# Patient Record
Sex: Female | Born: 1990 | Hispanic: Yes | Marital: Single | Smoking: Never smoker
Health system: Southern US, Community
[De-identification: ages and names within clinical notes are randomized; demographics above are authoritative.]

## PROBLEM LIST (undated history)

## (undated) ENCOUNTER — Inpatient Hospital Stay (HOSPITAL_COMMUNITY): Payer: Self-pay

## (undated) DIAGNOSIS — Z8489 Family history of other specified conditions: Secondary | ICD-10-CM

## (undated) DIAGNOSIS — D649 Anemia, unspecified: Secondary | ICD-10-CM

## (undated) DIAGNOSIS — R112 Nausea with vomiting, unspecified: Secondary | ICD-10-CM

## (undated) DIAGNOSIS — Z3490 Encounter for supervision of normal pregnancy, unspecified, unspecified trimester: Secondary | ICD-10-CM

## (undated) DIAGNOSIS — T7840XA Allergy, unspecified, initial encounter: Secondary | ICD-10-CM

## (undated) DIAGNOSIS — K8020 Calculus of gallbladder without cholecystitis without obstruction: Secondary | ICD-10-CM

## (undated) DIAGNOSIS — N809 Endometriosis, unspecified: Secondary | ICD-10-CM

## (undated) DIAGNOSIS — K635 Polyp of colon: Secondary | ICD-10-CM

## (undated) DIAGNOSIS — M549 Dorsalgia, unspecified: Secondary | ICD-10-CM

## (undated) DIAGNOSIS — F32A Depression, unspecified: Secondary | ICD-10-CM

## (undated) DIAGNOSIS — D689 Coagulation defect, unspecified: Secondary | ICD-10-CM

## (undated) HISTORY — DX: Coagulation defect, unspecified: D68.9

## (undated) HISTORY — DX: Endometriosis, unspecified: N80.9

## (undated) HISTORY — PX: EYE SURGERY: SHX253

## (undated) HISTORY — DX: Dorsalgia, unspecified: M54.9

## (undated) HISTORY — DX: Allergy, unspecified, initial encounter: T78.40XA

## (undated) HISTORY — DX: Polyp of colon: K63.5

## (undated) HISTORY — PX: TOOTH EXTRACTION: SUR596

---
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
## 2010-05-01 LAB — URINALYSIS, ROUTINE W REFLEX MICROSCOPIC
Bilirubin Urine: NEGATIVE
Ketones, ur: NEGATIVE mg/dL
Nitrite: NEGATIVE
Protein, ur: NEGATIVE mg/dL
pH: 6.5 (ref 5.0–8.0)

## 2010-05-01 LAB — URINE MICROSCOPIC-ADD ON

## 2010-06-19 ENCOUNTER — Emergency Department (HOSPITAL_COMMUNITY): Payer: Self-pay

## 2010-06-19 ENCOUNTER — Emergency Department (HOSPITAL_COMMUNITY)
Admission: EM | Admit: 2010-06-19 | Discharge: 2010-06-20 | Disposition: A | Discharge status: Home / Self Care | Payer: Self-pay | Attending: Emergency Medicine | Admitting: Emergency Medicine

## 2010-06-19 DIAGNOSIS — R0789 Other chest pain: Secondary | ICD-10-CM | POA: Insufficient documentation

## 2010-06-19 DIAGNOSIS — M79609 Pain in unspecified limb: Secondary | ICD-10-CM | POA: Insufficient documentation

## 2010-08-17 ENCOUNTER — Emergency Department (HOSPITAL_COMMUNITY)
Admission: EM | Admit: 2010-08-17 | Discharge: 2010-08-17 | Disposition: A | Discharge status: Home / Self Care | Payer: No Typology Code available for payment source | Attending: Emergency Medicine | Admitting: Emergency Medicine

## 2010-08-17 DIAGNOSIS — R1110 Vomiting, unspecified: Secondary | ICD-10-CM | POA: Insufficient documentation

## 2010-08-17 DIAGNOSIS — S060X0A Concussion without loss of consciousness, initial encounter: Secondary | ICD-10-CM | POA: Insufficient documentation

## 2010-08-17 DIAGNOSIS — R51 Headache: Secondary | ICD-10-CM | POA: Insufficient documentation

## 2011-01-19 ENCOUNTER — Encounter: Payer: Self-pay | Admitting: Neurology

## 2011-01-19 ENCOUNTER — Emergency Department (HOSPITAL_COMMUNITY)
Admission: EM | Admit: 2011-01-19 | Discharge: 2011-01-19 | Disposition: A | Discharge status: Home / Self Care | Payer: Self-pay | Attending: Emergency Medicine | Admitting: Emergency Medicine

## 2011-01-19 ENCOUNTER — Emergency Department (HOSPITAL_COMMUNITY): Payer: Self-pay

## 2011-01-19 DIAGNOSIS — N390 Urinary tract infection, site not specified: Secondary | ICD-10-CM | POA: Insufficient documentation

## 2011-01-19 DIAGNOSIS — R1032 Left lower quadrant pain: Secondary | ICD-10-CM | POA: Insufficient documentation

## 2011-01-19 LAB — WET PREP, GENITAL
Clue Cells Wet Prep HPF POC: NONE SEEN
Trich, Wet Prep: NONE SEEN
Yeast Wet Prep HPF POC: NONE SEEN

## 2011-01-19 LAB — DIFFERENTIAL
Eosinophils Relative: 1 % (ref 0–5)
Lymphocytes Relative: 45 % (ref 12–46)
Monocytes Absolute: 0.4 10*3/uL (ref 0.1–1.0)
Monocytes Relative: 9 % (ref 3–12)
Neutro Abs: 1.9 10*3/uL (ref 1.7–7.7)
Neutrophils Relative %: 44 % (ref 43–77)

## 2011-01-19 LAB — BASIC METABOLIC PANEL
Chloride: 107 mEq/L (ref 96–112)
GFR calc non Af Amer: >90 mL/min (ref >90)
Potassium: 4.1 mEq/L (ref 3.5–5.1)

## 2011-01-19 LAB — URINE MICROSCOPIC-ADD ON

## 2011-01-19 LAB — URINALYSIS, ROUTINE W REFLEX MICROSCOPIC
Bilirubin Urine: NEGATIVE
Glucose, UA: NEGATIVE mg/dL
Nitrite: NEGATIVE
Specific Gravity, Urine: 1.022 (ref 1.005–1.030)

## 2011-01-19 LAB — CBC
MCH: 24.8 pg — ABNORMAL LOW (ref 26.0–34.0)
MCV: 79.6 fL (ref 78.0–100.0)
RBC: 4.11 MIL/uL (ref 3.87–5.11)

## 2011-01-19 MED ORDER — MORPHINE SULFATE 4 MG/ML IJ SOLN
4.0000 mg | Freq: Once | INTRAMUSCULAR | Status: AC
  Administered 2011-01-19: 4 mg via INTRAVENOUS

## 2011-01-19 MED ORDER — MORPHINE SULFATE 4 MG/ML IJ SOLN
4.0000 mg | Freq: Once | INTRAMUSCULAR | Status: DC
  Filled 2011-01-19: qty 1

## 2011-01-19 MED ORDER — TRAMADOL HCL 50 MG PO TABS: 50.0000 mg | ORAL_TABLET | Freq: Four times a day (QID) | ORAL | Status: AC | PRN

## 2011-01-19 MED ORDER — NITROFURANTOIN MONOHYD MACRO 100 MG PO CAPS: 100.0000 mg | ORAL_CAPSULE | Freq: Two times a day (BID) | ORAL | Status: AC

## 2011-01-19 NOTE — ED Notes (Signed)
Patient transported to Ultrasound 

## 2011-01-19 NOTE — ED Notes (Signed)
Report given to Drinda Butts, RN in CDU. Patient moved to room 3.

## 2011-01-19 NOTE — ED Provider Notes (Signed)
Medical screening examination/treatment/procedure(s) were performed by non-physician practitioner and as supervising physician I was immediately available for consultation/collaboration. Devoria Albe, MD, Armando Gang   Ward Givens, MD 01/19/11 [phone]

## 2011-01-19 NOTE — ED Provider Notes (Signed)
History     CSN: [id number] Arrival date & time: 01/19/2011  7:59 AM   First MD Initiated Contact with Patient 01/19/11 0801      Chief Complaint  Patient presents with  . Abdominal Pain    (Consider location/radiation/quality/duration/timing/severity/associated sxs/prior treatment) HPI Comments: Patient reports she was awoken from sleep with sharp LLQ pain.  States LMP is current, is on time and normal.  Is experiencing urinary frequency.   Pain is exacerbated by standing.  Denies fevers, N/V, change in bowel habits.    Patient is a 20 y.o. female presenting with abdominal pain. The history is provided by the patient.  Abdominal Pain The primary symptoms of the illness include abdominal pain and vaginal bleeding. The primary symptoms of the illness do not include nausea, vomiting, diarrhea, hematemesis, hematochezia, dysuria or vaginal discharge.    History reviewed. No pertinent past medical history.  History reviewed. No pertinent past surgical history.  No family history on file.  History  Substance Use Topics  . Smoking status: Never Smoker   . Smokeless tobacco: Not on file  . Alcohol Use: Yes    OB History    Grav Para Term Preterm Abortions TAB SAB Ect Mult Living                  Review of Systems  Gastrointestinal: Positive for abdominal pain. Negative for nausea, vomiting, diarrhea, hematochezia and hematemesis.  Genitourinary: Positive for vaginal bleeding. Negative for dysuria and vaginal discharge.  All other systems reviewed and are negative.    Allergies  Penicillins and Shrimp  Home Medications   Current Outpatient Rx  Name Route Sig Dispense Refill  . IBUPROFEN PO Oral Take 4 tablets by mouth every 8 (eight) hours as needed. For pain       BP 128/67  Pulse 68  Temp(Src) 98.1 F (36.7 C) (Oral)  Resp 16  SpO2 100%  LMP 01/17/2011  Physical Exam  Constitutional: She is oriented to person, place, and time. She appears well-developed and  well-nourished.  HENT:  Head: Normocephalic and atraumatic.  Neck: Neck supple.  Cardiovascular: Normal rate, regular rhythm and normal heart sounds.   Pulmonary/Chest: Breath sounds normal. No respiratory distress. She has no wheezes. She has no rales. She exhibits no tenderness.  Abdominal: Soft. Bowel sounds are normal. She exhibits no distension and no mass. There is tenderness in the suprapubic area and left lower quadrant. There is no rebound and no guarding.  Neurological: She is alert and oriented to person, place, and time.    ED Course  Procedures (including critical care time)  Labs Reviewed  URINALYSIS, ROUTINE W REFLEX MICROSCOPIC - Abnormal; Notable for the following:    APPearance CLOUDY (*)    Hgb urine dipstick MODERATE (*)    Protein, ur 30 (*)    Leukocytes, UA LARGE (*)    All other components within normal limits  WET PREP, GENITAL - Abnormal; Notable for the following:    WBC, Wet Prep HPF POC FEW (*)    All other components within normal limits  CBC - Abnormal; Notable for the following:    Hemoglobin 10.2 (*)    HCT 32.7 (*)    MCH 24.8 (*)    RDW 19.1 (*)    All other components within normal limits  BASIC METABOLIC PANEL - Abnormal; Notable for the following:    Creatinine, Ser 0.49 (*)    All other components within normal limits  PREGNANCY, URINE  URINE  MICROSCOPIC-ADD ON  DIFFERENTIAL  GC/CHLAMYDIA PROBE AMP, GENITAL   US Transvaginal Non-ob  01/19/2011  *RADIOLOGY REPORT*  Clinical Data:  Left lower quadrant pain.  TRANSABDOMINAL AND TRANSVAGINAL ULTRASOUND OF PELVIS DOPPLER ULTRASOUND OF OVARIES  Technique:  Both transabdominal and transvaginal ultrasound examinations of the pelvis were performed. Transabdominal technique was performed for global imaging of the pelvis including uterus, ovaries, adnexal regions, and pelvic cul-de-sac.  It was necessary to proceed with endovaginal exam following the transabdominal exam to visualize the uterus,  ovaries, and adnexa  .  Color and duplex Doppler ultrasound was utilized to evaluate blood flow to the ovaries.  Comparison:  None.  Findings:  Uterus:  Normal in size and appearance  Endometrium:  Normal, 1.0 cm.  Right ovary: 3.9 x 1.9 x 2.2 cm Normal appearance/no adnexal mass  Left ovary: 3.9 x 2.6 x 3.0 cm Normal appearance/no adnexal mass  Pulsed Doppler evaluation demonstrates normal low-resistance arterial and venous waveforms in both ovaries.  Trace free pelvic fluid is likely physiologic.  IMPRESSION: Normal exam.  No evidence of pelvic mass or other significant abnormality.  No sonographic evidence for ovarian torsion.  Original Report Authenticated By: Consuello Bossier, M.D.   US Pelvis Complete  01/19/2011  *RADIOLOGY REPORT*  Clinical Data:  Left lower quadrant pain.  TRANSABDOMINAL AND TRANSVAGINAL ULTRASOUND OF PELVIS DOPPLER ULTRASOUND OF OVARIES  Technique:  Both transabdominal and transvaginal ultrasound examinations of the pelvis were performed. Transabdominal technique was performed for global imaging of the pelvis including uterus, ovaries, adnexal regions, and pelvic cul-de-sac.  It was necessary to proceed with endovaginal exam following the transabdominal exam to visualize the uterus, ovaries, and adnexa  .  Color and duplex Doppler ultrasound was utilized to evaluate blood flow to the ovaries.  Comparison:  None.  Findings:  Uterus:  Normal in size and appearance  Endometrium:  Normal, 1.0 cm.  Right ovary: 3.9 x 1.9 x 2.2 cm Normal appearance/no adnexal mass  Left ovary: 3.9 x 2.6 x 3.0 cm Normal appearance/no adnexal mass  Pulsed Doppler evaluation demonstrates normal low-resistance arterial and venous waveforms in both ovaries.  Trace free pelvic fluid is likely physiologic.  IMPRESSION: Normal exam.  No evidence of pelvic mass or other significant abnormality.  No sonographic evidence for ovarian torsion.  Original Report Authenticated By: Consuello Bossier, M.D.   [place] Art/ven Flow Abd  Pelv Doppler  01/19/2011  *RADIOLOGY REPORT*  Clinical Data:  Left lower quadrant pain.  TRANSABDOMINAL AND TRANSVAGINAL ULTRASOUND OF PELVIS DOPPLER ULTRASOUND OF OVARIES  Technique:  Both transabdominal and transvaginal ultrasound examinations of the pelvis were performed. Transabdominal technique was performed for global imaging of the pelvis including uterus, ovaries, adnexal regions, and pelvic cul-de-sac.  It was necessary to proceed with endovaginal exam following the transabdominal exam to visualize the uterus, ovaries, and adnexa  .  Color and duplex Doppler ultrasound was utilized to evaluate blood flow to the ovaries.  Comparison:  None.  Findings:  Uterus:  Normal in size and appearance  Endometrium:  Normal, 1.0 cm.  Right ovary: 3.9 x 1.9 x 2.2 cm Normal appearance/no adnexal mass  Left ovary: 3.9 x 2.6 x 3.0 cm Normal appearance/no adnexal mass  Pulsed Doppler evaluation demonstrates normal low-resistance arterial and venous waveforms in both ovaries.  Trace free pelvic fluid is likely physiologic.  IMPRESSION: Normal exam.  No evidence of pelvic mass or other significant abnormality.  No sonographic evidence for ovarian torsion.  Original Report Authenticated By: Karn Cassis.  TALBOT, M.D.     1. UTI (lower urinary tract infection)       MDM  Patient with suprapubic and LLQ pain. UA shows UTI.  Normal pelvic US with dopplers.  Labs otherwise unremarkable.          Dillard Cannon [place], [place] 01/19/11 [phone]

## 2011-01-19 NOTE — ED Notes (Signed)
Pt reports lower abdominal "cramping" since 5 am today, patient reports on her period. Describes "terrible period cramps". Pt reports heavier than normal bleeding.

## 2011-01-20 LAB — GC/CHLAMYDIA PROBE AMP, GENITAL
Chlamydia, DNA Probe: NEGATIVE
GC Probe Amp, Genital: NEGATIVE

## 2012-05-17 ENCOUNTER — Inpatient Hospital Stay (HOSPITAL_COMMUNITY)
Admission: AD | Admit: 2012-05-17 | Discharge: 2012-05-18 | Disposition: A | Discharge status: Home / Self Care | Payer: Self-pay | Source: Ambulatory Visit | Attending: Obstetrics & Gynecology | Admitting: Obstetrics & Gynecology

## 2012-05-17 ENCOUNTER — Encounter (HOSPITAL_COMMUNITY): Payer: Self-pay | Admitting: *Deleted

## 2012-05-17 DIAGNOSIS — R110 Nausea: Secondary | ICD-10-CM | POA: Insufficient documentation

## 2012-05-17 DIAGNOSIS — R51 Headache: Secondary | ICD-10-CM | POA: Insufficient documentation

## 2012-05-17 DIAGNOSIS — R109 Unspecified abdominal pain: Secondary | ICD-10-CM | POA: Insufficient documentation

## 2012-05-17 LAB — URINALYSIS, ROUTINE W REFLEX MICROSCOPIC
Bilirubin Urine: NEGATIVE
Nitrite: NEGATIVE
Specific Gravity, Urine: >1.03 — ABNORMAL HIGH (ref 1.005–1.030)
pH: 5.5 (ref 5.0–8.0)

## 2012-05-17 LAB — URINE MICROSCOPIC-ADD ON

## 2012-05-17 LAB — POCT PREGNANCY, URINE: Preg Test, Ur: NEGATIVE

## 2012-05-17 MED ORDER — ONDANSETRON 8 MG PO TBDP
8.0000 mg | ORAL_TABLET | Freq: Once | ORAL | Status: AC
  Administered 2012-05-18: 8 mg via ORAL
  Filled 2012-05-17: qty 1

## 2012-05-17 MED ORDER — KETOROLAC TROMETHAMINE 60 MG/2ML IM SOLN
60.0000 mg | Freq: Once | INTRAMUSCULAR | Status: AC
  Administered 2012-05-18: 60 mg via INTRAMUSCULAR
  Filled 2012-05-17: qty 2

## 2012-05-17 NOTE — MAU Note (Signed)
Pt reports "really bad headache and really bad stomach ache" since this am. Feels nauseated, but has not vomited. LMP 03/22

## 2012-05-17 NOTE — MAU Provider Note (Signed)
History     CSN: [id number]  Arrival date and time: 05/17/12 2035   First Provider Initiated Contact with Patient 05/17/12 2348      Chief Complaint  Patient presents with  . Abdominal Pain  . Headache   HPI Kelsey Black 22 y.o. comes to MAU tonight with several complaints.  Felt fine this morning but as the day progressed, she had nausea, vomiting, and then a severe headache and fever.  Denies any diarrhea.  Temp 100.0  OB History   Grav Para Term Preterm Abortions TAB SAB Ect Mult Living   0               Past Medical History  Diagnosis Date  . Medical history non-contributory     Past Surgical History  Procedure Laterality Date  . No past surgeries      Family History  Problem Relation Age of Onset  . Asthma Mother   . Asthma Sister   . Asthma Brother     History  Substance Use Topics  . Smoking status: Never Smoker   . Smokeless tobacco: Not on file  . Alcohol Use: Yes    Allergies:  Allergies  Allergen Reactions  . Penicillins Other (See Comments)    Leg went numb when younger.  . Shrimp (Shellfish Allergy) Other (See Comments)    Hand puffed up when touched it.    Prescriptions prior to admission  Medication Sig Dispense Refill  . IBUPROFEN PO Take 4 tablets by mouth every 8 (eight) hours as needed. For pain         Review of Systems  Constitutional: Positive for fever.  Gastrointestinal: Positive for nausea and abdominal pain. Negative for vomiting, diarrhea and constipation.  Genitourinary:       No vaginal discharge. No vaginal bleeding. No dysuria.  Neurological: Positive for headaches.   Physical Exam   Blood pressure 124/65, pulse 99, temperature 98.7 F (37.1 C), temperature source Oral, resp. rate 20, height 5\' 3"  (1.6 m), weight 133 lb (60.328 kg), last menstrual period 05/07/2012, SpO2 100.00%.  Physical Exam  Nursing note and vitals reviewed. Constitutional: She is oriented to person, place, and time. She appears  well-developed and well-nourished. She appears distressed.  Temp 100.0  HENT:  Head: Normocephalic.  Eyes: EOM are normal.  Neck: Neck supple.  Respiratory: No respiratory distress.  GI: Soft. Bowel sounds are normal. There is tenderness. There is no rebound and no guarding.  Tender to palpation on LUQ and LLQ.  No tenderness in RLQ OR RUQ.  Musculoskeletal: Normal range of motion.  Neurological: She is alert and oriented to person, place, and time.  Skin: Skin is warm and dry.  Psychiatric: She has a normal mood and affect.    MAU Course  Procedures Results for orders placed during the hospital encounter of 05/17/12 (from the past 24 hour(s))  URINALYSIS, ROUTINE W REFLEX MICROSCOPIC     Status: Abnormal   Collection Time    05/17/12  9:20 PM      Result Value Range   Color, Urine YELLOW  YELLOW   APPearance CLEAR  CLEAR   Specific Gravity, Urine >1.030 (*) 1.005 - 1.030   pH 5.5  5.0 - 8.0   Glucose, UA NEGATIVE  NEGATIVE mg/dL   Hgb urine dipstick SMALL (*) NEGATIVE   Bilirubin Urine NEGATIVE  NEGATIVE   Ketones, ur 40 (*) NEGATIVE mg/dL   Protein, ur NEGATIVE  NEGATIVE mg/dL   Urobilinogen, UA 0.2  0.0 - 1.0 mg/dL   Nitrite NEGATIVE  NEGATIVE   Leukocytes, UA NEGATIVE  NEGATIVE  URINE MICROSCOPIC-ADD ON     Status: None   Collection Time    05/17/12  9:20 PM      Result Value Range   Squamous Epithelial / LPF RARE  RARE   WBC, UA 0-2  <3 WBC/hpf   RBC / HPF 0-2  <3 RBC/hpf   Urine-Other MUCOUS PRESENT    POCT PREGNANCY, URINE     Status: None   Collection Time    05/17/12  9:39 PM      Result Value Range   Preg Test, Ur NEGATIVE  NEGATIVE  CBC     Status: None   Collection Time    05/18/12 12:15 AM      Result Value Range   WBC 5.6  4.0 - 10.5 K/uL   RBC 4.29  3.87 - 5.11 MIL/uL   Hemoglobin 12.5  12.0 - 15.0 g/dL   HCT 16.1  09.6 - 04.5 %   MCV 87.6  78.0 - 100.0 fL   MCH 29.1  26.0 - 34.0 pg   MCHC 33.2  30.0 - 36.0 g/dL   RDW 40.9  81.1 - 91.4 %    Platelets 213  150 - 400 K/uL  CMP pending  MDM IV fluids and client declined =- wants to try Toradol IM, Zofran Sublingual and go home.   After getting meds and having blood drawn, client wants to leave.  States her headache is feeling better and she is ready to go home.  Assessment and Plan  Abdominal pain - left side Nausea Headache  Plan Go to [facility] ER if your symptoms worsen May take tylenol tonight for fever Keep drinking fluids by mouth.  BURLESON,TERRI 05/17/2012, 11:55 PM

## 2012-05-18 LAB — COMPREHENSIVE METABOLIC PANEL
AST: 13 U/L (ref 0–37)
Albumin: 4 g/dL (ref 3.5–5.2)
Calcium: 9 mg/dL (ref 8.4–10.5)
Chloride: 100 mEq/L (ref 96–112)
Creatinine, Ser: 0.64 mg/dL (ref 0.50–1.10)
Total Bilirubin: 0.9 mg/dL (ref 0.3–1.2)
Total Protein: 6.8 g/dL (ref 6.0–8.3)

## 2012-05-18 LAB — CBC
MCHC: 33.2 g/dL (ref 30.0–36.0)
MCV: 87.6 fL (ref 78.0–100.0)
Platelets: 213 10*3/uL (ref 150–400)
RDW: 13 % (ref 11.5–15.5)
WBC: 5.6 10*3/uL (ref 4.0–10.5)

## 2013-04-12 ENCOUNTER — Other Ambulatory Visit (HOSPITAL_COMMUNITY)
Admission: RE | Admit: 2013-04-12 | Discharge: 2013-04-12 | Disposition: A | Discharge status: Home / Self Care | Payer: BC Managed Care – PPO | Source: Ambulatory Visit | Attending: Gynecology | Admitting: Gynecology

## 2013-04-12 ENCOUNTER — Encounter: Payer: Self-pay | Admitting: Women's Health

## 2013-04-12 ENCOUNTER — Ambulatory Visit (INDEPENDENT_AMBULATORY_CARE_PROVIDER_SITE_OTHER): Payer: BC Managed Care – PPO | Admitting: Women's Health

## 2013-04-12 VITALS — BP 112/72 | Ht 63.75 in | Wt 155.2 lb

## 2013-04-12 DIAGNOSIS — Z01419 Encounter for gynecological examination (general) (routine) without abnormal findings: Secondary | ICD-10-CM

## 2013-04-12 DIAGNOSIS — IMO0001 Reserved for inherently not codable concepts without codable children: Secondary | ICD-10-CM

## 2013-04-12 DIAGNOSIS — N898 Other specified noninflammatory disorders of vagina: Secondary | ICD-10-CM

## 2013-04-12 DIAGNOSIS — Z309 Encounter for contraceptive management, unspecified: Secondary | ICD-10-CM

## 2013-04-12 DIAGNOSIS — N760 Acute vaginitis: Secondary | ICD-10-CM

## 2013-04-12 DIAGNOSIS — Z23 Encounter for immunization: Secondary | ICD-10-CM

## 2013-04-12 DIAGNOSIS — Z113 Encounter for screening for infections with a predominantly sexual mode of transmission: Secondary | ICD-10-CM

## 2013-04-12 DIAGNOSIS — B9689 Other specified bacterial agents as the cause of diseases classified elsewhere: Secondary | ICD-10-CM

## 2013-04-12 DIAGNOSIS — N926 Irregular menstruation, unspecified: Secondary | ICD-10-CM

## 2013-04-12 DIAGNOSIS — A499 Bacterial infection, unspecified: Secondary | ICD-10-CM

## 2013-04-12 LAB — WET PREP FOR TRICH, YEAST, CLUE
Trich, Wet Prep: NONE SEEN
WBC, Wet Prep HPF POC: NONE SEEN
Yeast Wet Prep HPF POC: NONE SEEN

## 2013-04-12 LAB — CBC WITH DIFFERENTIAL/PLATELET
Basophils Absolute: 0 10*3/uL (ref 0.0–0.1)
Basophils Relative: 0 % (ref 0–1)
EOS ABS: 0.1 10*3/uL (ref 0.0–0.7)
EOS PCT: 1 % (ref 0–5)
HEMATOCRIT: 40 % (ref 36.0–46.0)
HEMOGLOBIN: 13.3 g/dL (ref 12.0–15.0)
LYMPHS ABS: 2.4 10*3/uL (ref 0.7–4.0)
Lymphocytes Relative: 32 % (ref 12–46)
MCH: 28 pg (ref 26.0–34.0)
MCHC: 33.3 g/dL (ref 30.0–36.0)
MCV: 84.2 fL (ref 78.0–100.0)
MONO ABS: 0.3 10*3/uL (ref 0.1–1.0)
MONOS PCT: 4 % (ref 3–12)
NEUTROS PCT: 63 % (ref 43–77)
Neutro Abs: 4.7 10*3/uL (ref 1.7–7.7)
Platelets: 363 10*3/uL (ref 150–400)
RBC: 4.75 MIL/uL (ref 3.87–5.11)
RDW: 13.2 % (ref 11.5–15.5)
WBC: 7.5 10*3/uL (ref 4.0–10.5)

## 2013-04-12 MED ORDER — ETONOGESTREL-ETHINYL ESTRADIOL 0.12-0.015 MG/24HR VA RING: VAGINAL_RING | VAGINAL | Status: DC

## 2013-04-12 MED ORDER — METRONIDAZOLE 0.75 % VA GEL: VAGINAL | Status: DC

## 2013-04-12 NOTE — Patient Instructions (Signed)

## 2013-04-12 NOTE — Progress Notes (Addendum)
Kelsey BryantKatherine Mercado Black August 03, 1990 [id number]    History:    Presents for annual exam.  Regular monthly cycle/condoms. Last cycle at the normal time but lighter than usual, U PT negative. Minimal healthcare in past years. Has not had gardasil.  Past medical history, past surgical history, family history and social history were all reviewed and documented in the EPIC chart. Recently graduated from [employer] 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 Currently working at [employer].  ROS:  A  ROS was performed and pertinent positives and negatives are included.  Exam:  Filed Vitals:   04/12/13 1445  BP: 112/72    General appearance:  Normal Thyroid:  Symmetrical, normal in size, without palpable masses or nodularity. Respiratory  Auscultation:  Clear without wheezing or rhonchi Cardiovascular  Auscultation:  Regular rate, without rubs, murmurs or gallops  Edema/varicosities:  Not grossly evident Abdominal  Soft,nontender, without masses, guarding or rebound.  Liver/spleen:  No organomegaly noted  Hernia:  None appreciated  Skin  Inspection:  Grossly normal   Breasts: Examined lying and sitting.     Right: Without masses, retractions, discharge or axillary adenopathy.     Left: Without masses, retractions, discharge or axillary adenopathy. Gentitourinary   Inguinal/mons:  Normal without inguinal adenopathy  External genitalia:  Normal  BUS/Urethra/Skene's glands:  Normal  Vagina:  Wet prep positive for clues and bacteria  Cervix:  Normal  Uterus:   normal in size, shape and contour.  Midline and mobile  Adnexa/parametria:     Rt: Without masses or tenderness.   Lt: Without masses or tenderness.  Anus and perineum: Normal   Assessment/Plan:  23 y.o. SHF G0 for annual exam with no complaints  Contraception management STD screen BV  Plan: Gardasil information given and reviewed, first given today will return to office in 2 and 6 months to complete series.  Contraception options reviewed will try NuvaRing, prescription, sample, proper use given and reviewed. Reviewed importance of condoms especially first month. SBE's, regular exercise, calcium rich diet, MVI daily encouraged. CBC, UA, Pap, GC/Chlamydia, HIV, hep B, C., RPR. Sister recently diagnosed with von Willebrand's will check panel today. Metrogel vaginal cream 1 applicator HS for 5 nights given, ETOH precautions given.    Harrington ChallengerYOUNG,NANCY J [facility] CenterWHNP, 5:07 PM 04/12/2013

## 2013-04-13 LAB — URINALYSIS W MICROSCOPIC + REFLEX CULTURE
BILIRUBIN URINE: NEGATIVE
CRYSTALS: NONE SEEN
Casts: NONE SEEN
Glucose, UA: NEGATIVE mg/dL
Leukocytes, UA: NEGATIVE
Nitrite: NEGATIVE
Protein, ur: NEGATIVE mg/dL
UROBILINOGEN UA: 0.2 mg/dL (ref 0.0–1.0)
pH: 6 (ref 5.0–8.0)

## 2013-04-13 LAB — GC/CHLAMYDIA PROBE AMP
CT PROBE, AMP APTIMA: NEGATIVE
GC Probe RNA: NEGATIVE

## 2013-04-13 LAB — HEPATITIS B SURFACE ANTIGEN: HEP B S AG: NEGATIVE

## 2013-04-13 LAB — HEPATITIS C ANTIBODY: HCV AB: NEGATIVE

## 2013-04-13 LAB — HIV ANTIBODY (ROUTINE TESTING W REFLEX): HIV: NONREACTIVE

## 2013-04-13 LAB — RPR

## 2013-04-14 LAB — URINE CULTURE

## 2013-04-15 LAB — VON WILLEBRAND PANEL
Coagulation Factor VIII: 80 % (ref 73–140)
RISTOCETIN CO-FACTOR, PLASMA: 44 % (ref 42–200)
VON WILLEBRAND ANTIGEN, PLASMA: 67 % (ref 50–217)

## 2013-04-17 ENCOUNTER — Encounter: Payer: Self-pay | Admitting: Women's Health

## 2013-04-17 ENCOUNTER — Other Ambulatory Visit: Payer: Self-pay | Admitting: Women's Health

## 2013-04-17 DIAGNOSIS — IMO0001 Reserved for inherently not codable concepts without codable children: Secondary | ICD-10-CM

## 2013-04-17 MED ORDER — MEDROXYPROGESTERONE ACETATE 150 MG/ML IM SUSP: 150.0000 mg | INTRAMUSCULAR | Status: DC

## 2013-04-18 LAB — PREGNANCY, URINE: Preg Test, Ur: NEGATIVE

## 2013-04-20 ENCOUNTER — Encounter: Payer: Self-pay | Admitting: Women's Health

## 2013-05-05 ENCOUNTER — Encounter: Payer: Self-pay | Admitting: Women's Health

## 2013-05-09 ENCOUNTER — Encounter: Payer: Self-pay | Admitting: Gynecology

## 2013-05-09 ENCOUNTER — Ambulatory Visit (INDEPENDENT_AMBULATORY_CARE_PROVIDER_SITE_OTHER): Payer: BC Managed Care – PPO | Admitting: Gynecology

## 2013-05-09 DIAGNOSIS — N949 Unspecified condition associated with female genital organs and menstrual cycle: Secondary | ICD-10-CM

## 2013-05-09 DIAGNOSIS — N925 Other specified irregular menstruation: Secondary | ICD-10-CM

## 2013-05-09 DIAGNOSIS — N938 Other specified abnormal uterine and vaginal bleeding: Secondary | ICD-10-CM

## 2013-05-09 DIAGNOSIS — N930 Postcoital and contact bleeding: Secondary | ICD-10-CM

## 2013-05-09 LAB — CBC WITH DIFFERENTIAL/PLATELET
BASOS ABS: 0 10*3/uL (ref 0.0–0.1)
BASOS PCT: 0 % (ref 0–1)
EOS ABS: 0.1 10*3/uL (ref 0.0–0.7)
EOS PCT: 1 % (ref 0–5)
HCT: 38.3 % (ref 36.0–46.0)
Hemoglobin: 12.5 g/dL (ref 12.0–15.0)
Lymphocytes Relative: 36 % (ref 12–46)
Lymphs Abs: 2.3 10*3/uL (ref 0.7–4.0)
MCH: 27.8 pg (ref 26.0–34.0)
MCHC: 32.6 g/dL (ref 30.0–36.0)
MCV: 85.1 fL (ref 78.0–100.0)
Monocytes Absolute: 0.4 10*3/uL (ref 0.1–1.0)
Monocytes Relative: 7 % (ref 3–12)
Neutro Abs: 3.6 10*3/uL (ref 1.7–7.7)
Neutrophils Relative %: 56 % (ref 43–77)
PLATELETS: 315 10*3/uL (ref 150–400)
RBC: 4.5 MIL/uL (ref 3.87–5.11)
RDW: 13.3 % (ref 11.5–15.5)
WBC: 6.4 10*3/uL (ref 4.0–10.5)

## 2013-05-09 MED ORDER — MEDROXYPROGESTERONE ACETATE 10 MG PO TABS: 10.0000 mg | ORAL_TABLET | Freq: Every day | ORAL | Status: DC

## 2013-05-09 NOTE — Progress Notes (Addendum)
Jefm BryantKatherine Mercado Colon 08-Nov-1990 [id number]        23 y.o.  G0P0 presents complaining of continued spotting on and off since her Depo-Provera injection 04/18/2013. Had been using NuvaRing and then got her Depo-Provera shot. Also notes an episode of bleeding after intercourse. No pain with intercourse. She does note some mild cramping on and off.  Past medical history,surgical history, problem list, medications, allergies, family history and social history were all reviewed and documented in the EPIC chart.  Exam: Kim assistant General appearance  Normal Abdomen soft nontender without masses guarding or rebound Pelvic external BUS vagina with dark staining. Cervix normal. Uterus normal size midline mobile nontender. Adnexa without masses or tenderness.  Assessment/Plan:  23 y.o. G0P0 DUB with Depo-Provera. Check baseline CBC and hCG. GC/Chlamydia screen done given the pelvic cramping. Provera 10 mg x10 days to see if we can eradicate the bleeding. Monitor for the next several weeks and it continues represent for evaluation.   Addendum: Patient did note her sister was diagnosed with "hemophilia" due to heavy periods. No one in the family has it. She is apparently undergoing a genetic workup to see if it is familial versus spontaneous mutation. I've asked the patient to get a copy of the specific diagnoses so that the patient can be tested for this also. Patient agrees to get this for us.   Note: This document was prepared with digital dictation and possible smart phrase technology. Any transcriptional errors that result from this process are unintentional.   Dara LordsFONTAINE,Darby Fleeman P MD, 3:43 PM 05/09/2013

## 2013-05-09 NOTE — Patient Instructions (Signed)
Followup for lab results. Take prescribed progesterone pill daily for 10 days. Followup if irregular bleeding continues. Followup with your sister's medical diagnosis so that we can evaluate you for this.

## 2013-05-10 LAB — HCG, SERUM, QUALITATIVE: Preg, Serum: NEGATIVE

## 2013-05-10 LAB — GC/CHLAMYDIA PROBE AMP
CT Probe RNA: NEGATIVE
GC Probe RNA: NEGATIVE

## 2013-05-15 ENCOUNTER — Encounter: Payer: Self-pay | Admitting: Gynecology

## 2013-05-16 NOTE — Telephone Encounter (Signed)
HCG was negative. Should not be having prolonged bleeding while on Depo-Provera but can have irregular bleeding initially into your body gets used to it. If irregular bleeding continues then we may suggest a low-dose oral contraceptive for a short period of time to override the system.

## 2013-05-24 ENCOUNTER — Telehealth: Payer: Self-pay

## 2013-05-24 ENCOUNTER — Other Ambulatory Visit: Payer: Self-pay | Admitting: Gynecology

## 2013-05-24 MED ORDER — NORETHINDRONE ACET-ETHINYL EST 1-20 MG-MCG PO TABS: 1.0000 | ORAL_TABLET | Freq: Every day | ORAL | Status: DC

## 2013-05-24 NOTE — Telephone Encounter (Signed)
Ask patient to start on a low dose oral contraceptive for one month such as Loestrin 120 equivalent see if this does not get rid of her bleeding and allow the Depo-Provera to take full effect. If she continues to bleed despite that she will need office visit with sonohysterogram.

## 2013-05-24 NOTE — Telephone Encounter (Signed)
I emailed patient back: "Natalia LeatherwoodKatherine, I checked with Dr. Audie BoxFontaine. His reply was "Ask patient to start on a low dose oral contraceptive for one month such as Loestrin 120 equivalent see if this does not get rid of her bleeding and allow the Depo-Provera to take full effect. If she continues to bleed despite that she will need office visit with sonohysterogram."   [facility] [address] You can pick it up and start it today. Take a pill every day, as close to the same time as you can every day.  If this does not help we will have [facility].( This is like an ultrasound but Dr. Audie BoxFontaine will insert some fluid into your uterus,through a catheter in your cervix, and it will allow him to view the lining of your uterus for any defects, like a polyp, etc.) Stay in touch and let me know if you have any questions about any of this. Verita SchneidersKathy A., MA"

## 2013-05-24 NOTE — Telephone Encounter (Signed)
Email this morning states "The pills the doctor gave me did not stop the bleeding, and the blood is bright red.."  You saw her on 05/09/13 and at that visit you wrote "Provera 10 mg x10 days to see if we can eradicate the bleeding. Monitor for the next several weeks and it continues represent for evaluation."  Should she come back in?

## 2013-05-30 ENCOUNTER — Ambulatory Visit: Payer: BC Managed Care – PPO

## 2013-06-02 ENCOUNTER — Ambulatory Visit (INDEPENDENT_AMBULATORY_CARE_PROVIDER_SITE_OTHER): Payer: BC Managed Care – PPO | Admitting: *Deleted

## 2013-06-02 DIAGNOSIS — Z23 Encounter for immunization: Secondary | ICD-10-CM

## 2013-06-19 ENCOUNTER — Other Ambulatory Visit: Payer: Self-pay | Admitting: Gynecology

## 2013-06-19 ENCOUNTER — Encounter: Payer: Self-pay | Admitting: Gynecology

## 2013-08-16 ENCOUNTER — Encounter: Payer: Self-pay | Admitting: Women's Health

## 2013-08-17 ENCOUNTER — Ambulatory Visit (INDEPENDENT_AMBULATORY_CARE_PROVIDER_SITE_OTHER): Payer: BC Managed Care – PPO | Admitting: Gynecology

## 2013-08-17 ENCOUNTER — Encounter: Payer: Self-pay | Admitting: Gynecology

## 2013-08-17 DIAGNOSIS — N926 Irregular menstruation, unspecified: Secondary | ICD-10-CM

## 2013-08-17 DIAGNOSIS — R350 Frequency of micturition: Secondary | ICD-10-CM

## 2013-08-17 DIAGNOSIS — Z309 Encounter for contraceptive management, unspecified: Secondary | ICD-10-CM

## 2013-08-17 LAB — URINALYSIS W MICROSCOPIC + REFLEX CULTURE
Bilirubin Urine: NEGATIVE
CASTS: NONE SEEN
Crystals: NONE SEEN
GLUCOSE, UA: NEGATIVE mg/dL
KETONES UR: NEGATIVE mg/dL
Leukocytes, UA: NEGATIVE
Nitrite: POSITIVE — AB
PH: 8.5 — AB (ref 5.0–8.0)
Protein, ur: NEGATIVE mg/dL
Specific Gravity, Urine: 1.015 (ref 1.005–1.030)
Urobilinogen, UA: 0.2 mg/dL (ref 0.0–1.0)

## 2013-08-17 LAB — PREGNANCY, URINE: Preg Test, Ur: NEGATIVE

## 2013-08-17 MED ORDER — SULFAMETHOXAZOLE-TRIMETHOPRIM 800-160 MG PO TABS: 1.0000 | ORAL_TABLET | Freq: Two times a day (BID) | ORAL | Status: DC

## 2013-08-17 MED ORDER — NORGESTIMATE-ETH ESTRADIOL 0.25-35 MG-MCG PO TABS: 1.0000 | ORAL_TABLET | Freq: Every day | ORAL | Status: DC

## 2013-08-17 NOTE — Addendum Note (Signed)
Addended by: Dayna BarkerGARDNER, KIMBERLY K on: 08/17/2013 03:12 PM   Modules accepted: Orders

## 2013-08-17 NOTE — Patient Instructions (Signed)
Take antibiotic twice daily for 3 days. Start on the new birth control pills this weekend. Call me after 3-4 months on the pills and we will switch it to a little bit lower dose pill. Call me sooner if any issues.

## 2013-08-17 NOTE — Progress Notes (Signed)
Kelsey BryantKatherine Mercado Black May 24, 1990 [id number]        23 y.o.  G0P0 having bleeding on and off for the last 2-1/2 weeks. Had transiently been on NuvaRing. Receive Depo-Provera 04/17/2013. Did not like the way she felt on Depo-Provera wants to start on oral contraceptives. Is sexually active using condoms with reported last intercourse over one month ago. Patient also having some urgency and frequency. No dysuria fever chills low back pain.  Past medical history,surgical history, problem list, medications, allergies, family history and social history were all reviewed and documented in the EPIC chart.  Directed ROS with pertinent positives and negatives documented in the history of present illness/assessment and plan.  Exam: Kim assistant General appearance:  Normal Spine: Straight without CVA tenderness Abdomen: Soft nontender without masses guarding rebound Pelvic: External BUS vagina with scant bleeding. Cervix normal. Uterus normal size midline mobile nontender. Adnexa without masses or tenderness.  Assessment/Plan:  23 y.o. G0P0 with irregular bleeding after single course of Depo-Provera. Last intercourse over one month ago. UPT negative today. Wants to start BCPs. Will start with Sprintec x4 months. Patient will call at the end of the third to fourth month and assuming she is doing well then I will back her down to a lower dose pill. Patient also having some urinary urgency and frequency. Urinalysis shows many bacteria some blood consistent with her vaginal bleeding and few epithelial cells. Positive nitrite. Will cover with Septra DS one by mouth twice a day x3 days. Followup if symptoms persist, worsen or recur.   Note: This document was prepared with digital dictation and possible smart phrase technology. Any transcriptional errors that result from this process are unintentional.   Kelsey Black,Kelsey Geffrard P MD, 2:46 PM 08/17/2013

## 2013-08-19 LAB — URINE CULTURE

## 2013-09-12 ENCOUNTER — Other Ambulatory Visit: Payer: Self-pay | Admitting: Gynecology

## 2013-09-14 ENCOUNTER — Other Ambulatory Visit: Payer: Self-pay | Admitting: Gynecology

## 2013-09-14 DIAGNOSIS — Z309 Encounter for contraceptive management, unspecified: Secondary | ICD-10-CM

## 2013-09-14 MED ORDER — NORGESTIMATE-ETH ESTRADIOL 0.25-35 MG-MCG PO TABS: 1.0000 | ORAL_TABLET | Freq: Every day | ORAL | Status: DC

## 2013-10-06 ENCOUNTER — Ambulatory Visit: Payer: BC Managed Care – PPO

## 2013-10-13 ENCOUNTER — Other Ambulatory Visit: Payer: Self-pay | Admitting: Gynecology

## 2013-10-13 ENCOUNTER — Other Ambulatory Visit: Payer: Self-pay | Admitting: Women's Health

## 2013-10-13 DIAGNOSIS — Z309 Encounter for contraceptive management, unspecified: Secondary | ICD-10-CM

## 2013-10-13 MED ORDER — NORGESTIMATE-ETH ESTRADIOL 0.25-35 MG-MCG PO TABS: 1.0000 | ORAL_TABLET | Freq: Every day | ORAL | Status: DC

## 2013-11-02 ENCOUNTER — Encounter: Payer: Self-pay | Admitting: Women's Health

## 2013-11-02 ENCOUNTER — Ambulatory Visit (INDEPENDENT_AMBULATORY_CARE_PROVIDER_SITE_OTHER): Payer: BC Managed Care – PPO | Admitting: Women's Health

## 2013-11-02 DIAGNOSIS — Z23 Encounter for immunization: Secondary | ICD-10-CM

## 2013-11-02 DIAGNOSIS — N926 Irregular menstruation, unspecified: Secondary | ICD-10-CM

## 2013-11-02 LAB — PREGNANCY, URINE: PREG TEST UR: NEGATIVE

## 2013-11-02 LAB — WET PREP FOR TRICH, YEAST, CLUE
Clue Cells Wet Prep HPF POC: NONE SEEN
TRICH WET PREP: NONE SEEN
WBC WET PREP: NONE SEEN
Yeast Wet Prep HPF POC: NONE SEEN

## 2013-11-02 NOTE — Progress Notes (Signed)
Presents with complaints of irregular bleeding dark to bright red.  LMP started August 20th, has not stopped.  Intermittent diffuse lower abd pain x 1 day- cramping type. Denies fever, chills, nausea, vomiting, vaginal discharge, itching, odor, or urinary symptoms.  Normal BMs.   On Sprintec- often misses pills.  Unprotected intercourse with same partner/neg cultures- would like to get pregnant.  Had been on Depo-Provera, last dose 04/2013 started on Sprintec July 2015.  Has received 2 Gardasil.  Exam: Well appearing.  External genital exam:  WNL- minimal menstrual blood present on labia majora.  Speculum exam: WNL- minimal menstrual blood from cervical os.  Bimanual exam: no CMT or adnexal tenderness.  Urine pregnancy test: negative.  Wet prep: negative.    Normal menstrual bleeding  Plan:  Reviewed normality of exam, and reviewed most likely menstrual cycle now. Desiring pregnancy will not start back on OCs. MVI with folic acid or prenatal vitamin daily.  Instructed on safe pregnancy behaviors, avoiding alcohol and over-the-counter medications.  Spoke with patient about determining the best time to conceive and about ovulation.  If bleeding does not resolve or if other symptoms arise contact the office.

## 2013-11-08 ENCOUNTER — Emergency Department (HOSPITAL_COMMUNITY)
Admission: EM | Admit: 2013-11-08 | Discharge: 2013-11-09 | Disposition: A | Discharge status: Home / Self Care | Payer: BC Managed Care – PPO | Attending: Emergency Medicine | Admitting: Emergency Medicine

## 2013-11-08 ENCOUNTER — Emergency Department (HOSPITAL_COMMUNITY): Payer: BC Managed Care – PPO

## 2013-11-08 DIAGNOSIS — S6980XA Other specified injuries of unspecified wrist, hand and finger(s), initial encounter: Secondary | ICD-10-CM | POA: Insufficient documentation

## 2013-11-08 DIAGNOSIS — W230XXA Caught, crushed, jammed, or pinched between moving objects, initial encounter: Secondary | ICD-10-CM | POA: Diagnosis not present

## 2013-11-08 DIAGNOSIS — Y9289 Other specified places as the place of occurrence of the external cause: Secondary | ICD-10-CM | POA: Insufficient documentation

## 2013-11-08 DIAGNOSIS — Y9389 Activity, other specified: Secondary | ICD-10-CM | POA: Insufficient documentation

## 2013-11-08 DIAGNOSIS — Y990 Civilian activity done for income or pay: Secondary | ICD-10-CM | POA: Diagnosis not present

## 2013-11-08 DIAGNOSIS — S6990XA Unspecified injury of unspecified wrist, hand and finger(s), initial encounter: Secondary | ICD-10-CM | POA: Diagnosis present

## 2013-11-08 DIAGNOSIS — Z880 Allergy status to penicillin: Secondary | ICD-10-CM | POA: Diagnosis not present

## 2013-11-08 DIAGNOSIS — M79645 Pain in left finger(s): Secondary | ICD-10-CM

## 2013-11-08 MED ORDER — OXYCODONE-ACETAMINOPHEN 5-325 MG PO TABS
1.0000 | ORAL_TABLET | Freq: Once | ORAL | Status: AC
  Administered 2013-11-09: 1 via ORAL
  Filled 2013-11-08: qty 1

## 2013-11-08 MED ORDER — OXYCODONE-ACETAMINOPHEN 5-325 MG PO TABS: 1.0000 | ORAL_TABLET | Freq: Once | ORAL | Status: DC

## 2013-11-08 NOTE — ED Notes (Signed)
Patient arrived via [facility] from work where she got both hands stuck in a belt loader. Right hand was stuck for 4 min and the left hand was for 15 mins. Patient able to minimally move her fingers on the left hand. No deformity or bleeding noted at this time. EMS administered Fentanyl 100 mcg. A/O, VSS.

## 2013-11-08 NOTE — ED Provider Notes (Signed)
CSN: [id number]     Arrival date & time 11/08/13  2213 History   First MD Initiated Contact with Patient 11/08/13 2217     Chief Complaint  Patient presents with  . Hand Injury   Patient is a 23 y.o. female presenting with hand injury. The history is provided by the patient.  Hand Injury Location:  Hand Time since incident:  45 minutes Injury: yes   Mechanism of injury comment:  At work, both fingers were stuck in luggage belt while she was removing luggage from a plane. right fingers easily removed without pain, fire had to remove left hand and she notes pain to 2-4ths digits Affected location: left fingers. Pain details:    Quality:  Aching   Severity:  Moderate   Onset quality:  Sudden   Timing:  Constant   Progression:  Unchanged Chronicity:  New Dislocation: no   Foreign body present:  No foreign bodies Prior injury to area:  No Relieved by:  Rest Worsened by:  Movement (palpation) Associated symptoms: decreased range of motion (left fingers, full ROM of wrists) and stiffness   Associated symptoms: no numbness, no swelling and no tingling   Risk factors: no frequent fractures    Given fentanyl 100 mcg by EMS en route   Past Medical History  Diagnosis Date  . Medical history non-contributory    Past Surgical History  Procedure Laterality Date  . No past surgeries     Family History  Problem Relation Age of Onset  . Asthma Mother   . Asthma Sister   . Asthma Brother    History  Substance Use Topics  . Smoking status: Never Smoker   . Smokeless tobacco: Never Used  . Alcohol Use: Yes     Comment: wine   OB History   Grav Para Term Preterm Abortions TAB SAB Ect Mult Living   0              Review of Systems  Musculoskeletal: Positive for stiffness.   Allergies  Penicillins and Shrimp  Home Medications   Prior to Admission medications   Not on File   BP 114/57  Pulse 79  Temp(Src) 98.1 F (36.7 C) (Oral)  Resp 18  SpO2 100%  LMP  10/05/2013 Physical Exam  Nursing note and vitals reviewed. Constitutional: She is oriented to person, place, and time. She appears well-developed and well-nourished. No distress.  HENT:  Head: Normocephalic and atraumatic.  Nose: Nose normal.  Eyes: Conjunctivae are normal.  Neck: Normal range of motion. Neck supple. No tracheal deviation present.  Cardiovascular: Normal rate, regular rhythm and normal heart sounds.   No murmur heard. Pulmonary/Chest: Effort normal and breath sounds normal. No respiratory distress. She has no rales.  Abdominal: Soft. Bowel sounds are normal. She exhibits no distension and no mass. There is no tenderness.  Musculoskeletal: She exhibits no edema.  Limited ROM of left 2-4th digits due to pain but can wiggle with flexion and extension.  Wrists FROM, no TTP to palm, wrist or forearms.  R finger without pain or swelling.  No edema to left fingers and no bruising but TTP.  Neurological: She is alert and oriented to person, place, and time.  SENSORY: sensation is intact to light touch in:  superficial radial nerve distribution (dorsal first web space) median nerve distribution (tip of index finger)   ulnar nerve distribution (tip of small finger)     MOTOR:  + motor posterior interosseous nerve (thumb IP extension) +  anterior interosseous nerve (thumb IP flexion, but limited index finger DIP flexion due to pain) + radial nerve (wrist extension) + median nerve (palpable firing thenar mass) + ulnar nerve   Skin: Skin is warm and dry. No rash noted.  No open wounds or bruising  Psychiatric: She has a normal mood and affect.    ED Course  Procedures (including critical care time) Labs Review Labs Reviewed - No data to display  Imaging Review Dg Hand Complete Left  11/08/2013   CLINICAL DATA:  Left hand injury, finger tips caught in mechanical belt removing luggage from airplane  EXAM: LEFT HAND - COMPLETE 3+ VIEW  COMPARISON:  None.  FINDINGS: There is  no evidence of fracture or dislocation. There is no evidence of arthropathy or other focal bone abnormality. Soft tissues are unremarkable.  IMPRESSION: Negative.   Electronically Signed   By: Esperanza Heir M.D.   On: 11/08/2013 23:32   Dg Hand Complete Right  11/08/2013   CLINICAL DATA:  Right hand injury, finger tips caught in mechanical belt.  EXAM: RIGHT HAND - COMPLETE 3+ VIEW  COMPARISON:  None.  FINDINGS: There is no evidence of fracture or dislocation. Negative soft tissues.  IMPRESSION: Negative.   Electronically Signed   By: Tiburcio Pea M.D.   On: 11/08/2013 23:35     EKG Interpretation None      MDM   Final diagnoses:  Pain of finger of left hand   Presents with crush vs shear injury to left digits.  No obvious deformity and firing of muscles intact but limited due to pain.  No open wounds. N/v intact. The right hand involved in mechanism has no injuries and is not tender.  XR images. Negative.    11:53 PM improved but still limited ROM of fingers. Pt says it feels better.  NVI.  Clear for discharge. Discussed worrisome findings and to contact hand surgeon at number provided if needed.     Sofie Rower, MD 11/08/13 [phone]

## 2013-11-09 NOTE — ED Provider Notes (Signed)
I saw and evaluated the patient, reviewed the resident's note and I agree with the findings and plan.   EKG Interpretation None      Here with L hand crush/shear injury at work. Was wearing gloves. Severely tender and painful ROM, but no deformities, no swelling. NVI. Xray ok. Given pain meds, hand surgery f/u.  Elwin Mocha, MD 11/09/13 [phone]

## 2013-11-22 ENCOUNTER — Encounter: Payer: Self-pay | Admitting: Women's Health

## 2013-11-22 ENCOUNTER — Ambulatory Visit (INDEPENDENT_AMBULATORY_CARE_PROVIDER_SITE_OTHER): Payer: BC Managed Care – PPO | Admitting: Women's Health

## 2013-11-22 VITALS — BP 115/78 | Ht 64.0 in | Wt 159.0 lb

## 2013-11-22 DIAGNOSIS — N921 Excessive and frequent menstruation with irregular cycle: Secondary | ICD-10-CM

## 2013-11-22 LAB — PREGNANCY, URINE: PREG TEST UR: NEGATIVE

## 2013-11-22 LAB — TSH: TSH: 1.32 u[IU]/mL (ref 0.350–4.500)

## 2013-11-22 MED ORDER — MEDROXYPROGESTERONE ACETATE 10 MG PO TABS: 10.0000 mg | ORAL_TABLET | Freq: Every day | ORAL | Status: DC

## 2013-11-22 NOTE — Patient Instructions (Signed)

## 2013-11-22 NOTE — Progress Notes (Signed)
Patient ID: Jefm BryantKatherine Mercado Black, female   DOB: 05-08-1990, 23 y.o.   MRN: [id number] Presents with continued problems with menstrual cycle. History of regular monthly cycles until this year.  Depo-Provera 04/2013 and has had irregular bleeding since. Negative STD screen with current partner. LMP 11/02/2013 bled heavily for about one week, stopped for several days,  dark spotting with small clots  for greater than one week, several days ago bright red bleeding for 3 days and has been spotting since. Conception ok, declines contraception.  Exam: Appears well. Abdomen soft, no rebound or radiation of pain. External genitalia within normal limits, speculum exam scant menses type blood present, bimanual uterus small non tender no adnexal fullness or tenderness.   Menorrhagia with irregular cycles/DUB  Plan: U PT, TSH, prolactin, Provera 10 for 10 days, instructed to call if bleeding persists or if cycles do not regulate after completing. Contraception options reviewed and declined.

## 2013-11-23 ENCOUNTER — Encounter: Payer: Self-pay | Admitting: Women's Health

## 2013-11-23 LAB — PROLACTIN: Prolactin: 6.7 ng/mL

## 2013-11-24 ENCOUNTER — Encounter: Payer: Self-pay | Admitting: Women's Health

## 2013-12-14 ENCOUNTER — Ambulatory Visit (INDEPENDENT_AMBULATORY_CARE_PROVIDER_SITE_OTHER): Payer: BC Managed Care – PPO | Admitting: Women's Health

## 2013-12-14 ENCOUNTER — Encounter: Payer: Self-pay | Admitting: Women's Health

## 2013-12-14 VITALS — BP 115/80 | Ht 65.0 in | Wt 158.0 lb

## 2013-12-14 DIAGNOSIS — Z30011 Encounter for initial prescription of contraceptive pills: Secondary | ICD-10-CM

## 2013-12-14 MED ORDER — NORGESTIMATE-ETH ESTRADIOL 0.25-35 MG-MCG PO TABS: 1.0000 | ORAL_TABLET | Freq: Every day | ORAL | Status: DC

## 2013-12-14 NOTE — Progress Notes (Signed)
Presents with irregular menstrual cycle with persistent spotting. March 2015 Depo with irregular bleeding since. [place]. OV 11/22/13 negative hCG, normal prolactin and thyroid, neg GC/Chl 04/2013 same partner given Provera 10 x 10 days.  Reports bright red bleeding for those 10 days, using 2-3 pads per day, and spotting since then. Started regular cycle today with cramping. Pregnancy okay.  Exam: Appears well.   Dysfunctional Uterine Bleeding  Plan: Start Sprintec today and continue for 2-3 cycles. Counseled to take pill at same time each day. Instructed to call if cycle last greater than 7 days, or persistent spotting after second pack of pills. If still problematic after 3 cycles, sonohysterogram.

## 2013-12-19 ENCOUNTER — Encounter: Payer: Self-pay | Admitting: Women's Health

## 2013-12-26 ENCOUNTER — Ambulatory Visit: Payer: BC Managed Care – PPO | Admitting: Women's Health

## 2014-08-01 ENCOUNTER — Encounter: Payer: Self-pay | Admitting: Women's Health

## 2014-08-01 ENCOUNTER — Ambulatory Visit (INDEPENDENT_AMBULATORY_CARE_PROVIDER_SITE_OTHER): Payer: BLUE CROSS/BLUE SHIELD | Admitting: Women's Health

## 2014-08-01 VITALS — BP 116/72 | Ht 63.0 in | Wt 155.6 lb

## 2014-08-01 DIAGNOSIS — N760 Acute vaginitis: Secondary | ICD-10-CM | POA: Diagnosis not present

## 2014-08-01 DIAGNOSIS — B373 Candidiasis of vulva and vagina: Secondary | ICD-10-CM

## 2014-08-01 DIAGNOSIS — Z01419 Encounter for gynecological examination (general) (routine) without abnormal findings: Secondary | ICD-10-CM

## 2014-08-01 DIAGNOSIS — B9689 Other specified bacterial agents as the cause of diseases classified elsewhere: Secondary | ICD-10-CM | POA: Insufficient documentation

## 2014-08-01 DIAGNOSIS — A499 Bacterial infection, unspecified: Secondary | ICD-10-CM | POA: Diagnosis not present

## 2014-08-01 DIAGNOSIS — Z113 Encounter for screening for infections with a predominantly sexual mode of transmission: Secondary | ICD-10-CM

## 2014-08-01 DIAGNOSIS — B3731 Acute candidiasis of vulva and vagina: Secondary | ICD-10-CM

## 2014-08-01 LAB — CBC WITH DIFFERENTIAL/PLATELET
BASOS ABS: 0 10*3/uL (ref 0.0–0.1)
Basophils Relative: 0 % (ref 0–1)
Eosinophils Absolute: 0.1 10*3/uL (ref 0.0–0.7)
Eosinophils Relative: 1 % (ref 0–5)
HEMATOCRIT: 33.3 % — AB (ref 36.0–46.0)
Hemoglobin: 10.4 g/dL — ABNORMAL LOW (ref 12.0–15.0)
Lymphocytes Relative: 34 % (ref 12–46)
Lymphs Abs: 2.2 10*3/uL (ref 0.7–4.0)
MCH: 26.5 pg (ref 26.0–34.0)
MCHC: 31.2 g/dL (ref 30.0–36.0)
MCV: 84.7 fL (ref 78.0–100.0)
MONO ABS: 0.6 10*3/uL (ref 0.1–1.0)
MONOS PCT: 9 % (ref 3–12)
MPV: 10.1 fL (ref 8.6–12.4)
NEUTROS ABS: 3.6 10*3/uL (ref 1.7–7.7)
Neutrophils Relative %: 56 % (ref 43–77)
PLATELETS: 350 10*3/uL (ref 150–400)
RBC: 3.93 MIL/uL (ref 3.87–5.11)
RDW: 16.1 % — ABNORMAL HIGH (ref 11.5–15.5)
WBC: 6.4 10*3/uL (ref 4.0–10.5)

## 2014-08-01 LAB — WET PREP FOR TRICH, YEAST, CLUE: TRICH WET PREP: NONE SEEN

## 2014-08-01 LAB — PREGNANCY, URINE: PREG TEST UR: NEGATIVE

## 2014-08-01 MED ORDER — METRONIDAZOLE 0.75 % VA GEL: VAGINAL | Status: DC

## 2014-08-01 MED ORDER — FLUCONAZOLE 150 MG PO TABS: 150.0000 mg | ORAL_TABLET | Freq: Once | ORAL | Status: DC

## 2014-08-01 NOTE — Progress Notes (Signed)
Cerra Marotta Colon 10/30/1990 [id number]    History:    Presents for annual exam.  Regular monthly cycle/condoms. Normal Pap history, gardasil series completed. Requested UPT (negative) and STD  screen.  Past medical history, past surgical history, family history and social history were all reviewed and documented in the EPIC chart. Works for [employer]. Reports parents is healthy.  ROS:  A ROS was performed and pertinent positives and negatives are included.  Exam:  Filed Vitals:   08/01/14 1559  BP: 116/72    General appearance:  Normal Thyroid:  Symmetrical, normal in size, without palpable masses or nodularity. Respiratory  Auscultation:  Clear without wheezing or rhonchi Cardiovascular  Auscultation:  Regular rate, without rubs, murmurs or gallops  Edema/varicosities:  Not grossly evident Abdominal  Soft,nontender, without masses, guarding or rebound.  Liver/spleen:  No organomegaly noted  Hernia:  None appreciated  Skin  Inspection:  Grossly normal   Breasts: Examined lying and sitting.     Right: Without masses, retractions, discharge or axillary adenopathy.     Left: Without masses, retractions, discharge or axillary adenopathy. Gentitourinary   Inguinal/mons:  Normal without inguinal adenopathy  External genitalia:  Normal  BUS/Urethra/Skene's glands:  Normal  Vagina:  Moderate amount of a white discharge wet prep positive for yeast, amines, many clues and TNTC bacteria  Cervix:  Normal  Uterus:  normal in size, shape and contour.  Midline and mobile  Adnexa/parametria:     Rt: Without masses or tenderness.   Lt: Without masses or tenderness.  Anus and perineum: Normal    Assessment/Plan:  24 y.o. SHF G0  for annual exam with complaint of increased discharge.  Bacteria vaginosis and yeast STD screen Contraception management  Plan: 1. MetroGel vaginal cream 1 applicator at bedtime 5, alcohol precautions reviewed. Diflucan 150 by mouth times one dose  with refill. Yeast prevention discussed. 2. GC/Chlamydia, HIV, hep B, C, RPR. Condoms encouraged until permanent partner. 3. Contraception options reviewed and declined will continue condoms for return to office if so chooses. Plan B emergency contraception reviewed. 4. SBE's, exercise, calcium rich diet, MVI daily encouraged. Pap normal 2015 new screening guidelines reviewed. CBC, Adrian Saran [facility], 4:33 PM 08/01/2014

## 2014-08-01 NOTE — Patient Instructions (Signed)

## 2014-08-02 LAB — HIV ANTIBODY (ROUTINE TESTING W REFLEX): HIV: NONREACTIVE

## 2014-08-02 LAB — HEPATITIS C ANTIBODY: HCV AB: NEGATIVE

## 2014-08-02 LAB — HEPATITIS B SURFACE ANTIGEN: Hepatitis B Surface Ag: NEGATIVE

## 2014-08-02 LAB — RPR

## 2014-08-02 LAB — GC/CHLAMYDIA PROBE AMP
CT PROBE, AMP APTIMA: NEGATIVE
GC PROBE AMP APTIMA: NEGATIVE

## 2014-09-24 ENCOUNTER — Encounter: Payer: Self-pay | Admitting: Women's Health

## 2014-09-24 ENCOUNTER — Other Ambulatory Visit: Payer: Self-pay | Admitting: Women's Health

## 2014-09-24 MED ORDER — NORGESTIMATE-ETH ESTRADIOL 0.25-35 MG-MCG PO TABS: 1.0000 | ORAL_TABLET | Freq: Every day | ORAL | Status: DC

## 2015-05-15 ENCOUNTER — Emergency Department (INDEPENDENT_AMBULATORY_CARE_PROVIDER_SITE_OTHER)
Admission: EM | Admit: 2015-05-15 | Discharge: 2015-05-15 | Disposition: A | Discharge status: Home / Self Care | Payer: Self-pay | Source: Home / Self Care | Attending: Family Medicine | Admitting: Family Medicine

## 2015-05-15 ENCOUNTER — Encounter (HOSPITAL_COMMUNITY): Payer: Self-pay | Admitting: Emergency Medicine

## 2015-05-15 DIAGNOSIS — T07XXXA Unspecified multiple injuries, initial encounter: Secondary | ICD-10-CM

## 2015-05-15 DIAGNOSIS — R32 Unspecified urinary incontinence: Secondary | ICD-10-CM

## 2015-05-15 DIAGNOSIS — R42 Dizziness and giddiness: Secondary | ICD-10-CM

## 2015-05-15 DIAGNOSIS — T148 Other injury of unspecified body region: Secondary | ICD-10-CM

## 2015-05-15 LAB — POCT URINALYSIS DIP (DEVICE)
Bilirubin Urine: NEGATIVE
Glucose, UA: NEGATIVE mg/dL
Ketones, ur: NEGATIVE mg/dL
LEUKOCYTES UA: NEGATIVE
NITRITE: NEGATIVE
PH: 7 (ref 5.0–8.0)
PROTEIN: NEGATIVE mg/dL
SPECIFIC GRAVITY, URINE: 1.025 (ref 1.005–1.030)
UROBILINOGEN UA: 0.2 mg/dL (ref 0.0–1.0)

## 2015-05-15 NOTE — Discharge Instructions (Signed)
The multitude of symptoms that you are experiencing need to be evaluated properly.  This will require tests that are no available at the urgent care.   Since you do not have a PCP, you may need to be seen at the emergency department.  Your urine test tonight does not show any signs of a bacterial infection.

## 2015-05-15 NOTE — ED Notes (Signed)
Here with multiple medical concerns that need doctors visit C/o near syncopal episode that started today while @ work Dizziness lasting 1 hour and feeling shaky Also c/o multiple ecchymotic areas to right back of leg and all over back, off balance and urine incontinence for many months No medical problems reported  LMP 05/03/15

## 2015-05-16 ENCOUNTER — Encounter: Payer: Self-pay | Admitting: Women's Health

## 2015-05-16 NOTE — ED Provider Notes (Signed)
CSN: [id number]     Arrival date & time 05/15/15  1751 History   First MD Initiated Contact with Patient 05/15/15 1946     Chief Complaint  Patient presents with  . Near Syncope  . Bleeding/Bruising   (Consider location/radiation/quality/duration/timing/severity/associated sxs/prior Treatment) HPI Pt gives a complicated history of bruises that suddenly appear without trauma, urinary incontinence and dizziness. States symptoms are getting worse. Most symptoms present for more than 2 weeks. Has not sought medical attention. No previous history of these symptoms.  Also states she felt as if she would pass out today.  History reviewed. No pertinent past medical history. Past Surgical History  Procedure Laterality Date  . No past surgeries     Family History  Problem Relation Age of Onset  . Asthma Mother   . Asthma Sister   . Asthma Brother    Social History  Substance Use Topics  . Smoking status: Never Smoker   . Smokeless tobacco: None  . Alcohol Use: No     Comment: wine   OB History    Gravida Para Term Preterm AB TAB SAB Ectopic Multiple Living   0 0 0 0 0 0 0 0       Review of Systems Multiple complaints Allergies  Penicillins; Shrimp; Penicillins; and Shrimp  Home Medications   Prior to Admission medications   Medication Sig Start Date End Date Taking? Authorizing Provider  fluconazole (DIFLUCAN) 150 MG tablet Take 1 tablet (150 mg total) by mouth once. 08/01/14   Harrington ChallengerNancy J Young, NP  IBUPROFEN PO Take by mouth as needed.    Historical Provider, MD  metroNIDAZOLE (METROGEL VAGINAL) 0.75 % vaginal gel 1 applicator per vagina at HS x 5 08/01/14   Harrington ChallengerNancy J Young, NP  norgestimate-ethinyl estradiol (ORTHO-CYCLEN,SPRINTEC,PREVIFEM) 0.25-35 MG-MCG tablet Take 1 tablet by mouth daily. 09/24/14   Harrington ChallengerNancy J Young, NP   Meds Ordered and Administered this Visit  Medications - No data to display  BP 113/72 mmHg  Pulse 76  Temp(Src) 98.9 F (37.2 C) (Oral)  Resp 18  SpO2 100%   LMP 05/03/2015 No data found.   Physical Exam NURSES NOTES AND VITAL SIGNS REVIEWED. CONSTITUTIONAL: Well developed, well nourished, no acute distress HEENT: normocephalic, atraumatic EYES: Conjunctiva normal NECK:normal ROM, supple, no adenopathy PULMONARY:No respiratory distress, normal effort MUSCULOSKELETAL: Normal ROM of all extremities,  SKIN: warm and dry without rash, diffuse bruises of skin right lower leg, back, abdomen.  PSYCHIATRIC: Mood and affect, behavior are normal  ED Course  Procedures (including critical care time)  Labs Review Labs Reviewed  POCT URINALYSIS DIP (DEVICE) - Abnormal; Notable for the following:    Hgb urine dipstick TRACE (*)    All other components within normal limits    Imaging Review No results found.   Visual Acuity Review  Right Eye Distance:   Left Eye Distance:   Bilateral Distance:    Right Eye Near:   Left Eye Near:    Bilateral Near:         MDM   1. Dizziness   2. Multiple contusions   3. Incontinence of urine in female    Pt is referred to a higher level of care than can be provided in the UC.    Tharon AquasFrank C Patrick, PA 05/16/15 1056

## 2015-05-19 ENCOUNTER — Ambulatory Visit (INDEPENDENT_AMBULATORY_CARE_PROVIDER_SITE_OTHER): Payer: PRIVATE HEALTH INSURANCE | Admitting: Family Medicine

## 2015-05-19 VITALS — BP 122/72 | HR 88 | Temp 98.3°F | Resp 17 | Ht 64.5 in | Wt 168.0 lb

## 2015-05-19 DIAGNOSIS — R5383 Other fatigue: Secondary | ICD-10-CM | POA: Diagnosis not present

## 2015-05-19 DIAGNOSIS — T148XXA Other injury of unspecified body region, initial encounter: Secondary | ICD-10-CM

## 2015-05-19 DIAGNOSIS — R3129 Other microscopic hematuria: Secondary | ICD-10-CM | POA: Diagnosis not present

## 2015-05-19 LAB — POCT URINALYSIS DIP (MANUAL ENTRY)
Bilirubin, UA: NEGATIVE
Glucose, UA: NEGATIVE
Ketones, POC UA: NEGATIVE
Leukocytes, UA: NEGATIVE
Nitrite, UA: NEGATIVE
PH UA: 7.5
Protein Ur, POC: NEGATIVE
SPEC GRAV UA: 1.02
Urobilinogen, UA: 0.2

## 2015-05-19 LAB — THYROID PANEL WITH TSH
Free Thyroxine Index: 1.8 (ref 1.4–3.8)
T3 Uptake: 29 % (ref 22–35)
T4, Total: 6.1 ug/dL (ref 4.5–12.0)
TSH: 1.76 mIU/L

## 2015-05-19 LAB — COMPREHENSIVE METABOLIC PANEL
ALBUMIN: 4.2 g/dL (ref 3.6–5.1)
ALT: 14 U/L (ref 6–29)
AST: 14 U/L (ref 10–30)
Alkaline Phosphatase: 63 U/L (ref 33–115)
BILIRUBIN TOTAL: 0.5 mg/dL (ref 0.2–1.2)
BUN: 9 mg/dL (ref 7–25)
CALCIUM: 9.4 mg/dL (ref 8.6–10.2)
CHLORIDE: 104 mmol/L (ref 98–110)
CO2: 31 mmol/L (ref 20–31)
Creat: 0.78 mg/dL (ref 0.50–1.10)
Glucose, Bld: 79 mg/dL (ref 65–99)
Potassium: 4.7 mmol/L (ref 3.5–5.3)
Sodium: 139 mmol/L (ref 135–146)
Total Protein: 7.1 g/dL (ref 6.1–8.1)

## 2015-05-19 LAB — POCT CBC
GRANULOCYTE PERCENT: 54.6 % (ref 37–80)
HCT, POC: 37.8 % (ref 37.7–47.9)
Hemoglobin: 12.9 g/dL (ref 12.2–16.2)
Lymph, poc: 2.5 (ref 0.6–3.4)
MCH: 28.7 pg (ref 27–31.2)
MCHC: 34 g/dL (ref 31.8–35.4)
MCV: 84.4 fL (ref 80–97)
MID (CBC): 0.4 (ref 0–0.9)
MPV: 7.4 fL (ref 0–99.8)
PLATELET COUNT, POC: 339 10*3/uL (ref 142–424)
POC Granulocyte: 3.5 (ref 2–6.9)
POC LYMPH PERCENT: 39.1 %L (ref 10–50)
POC MID %: 6.3 %M (ref 0–12)
RBC: 4.48 M/uL (ref 4.04–5.48)
RDW, POC: 14.4 %
WBC: 6.4 10*3/uL (ref 4.6–10.2)

## 2015-05-19 LAB — POC MICROSCOPIC URINALYSIS (UMFC)

## 2015-05-19 LAB — PROTIME-INR
INR: 1.04 (ref <1.50)
PROTHROMBIN TIME: 13.7 s (ref 11.6–15.2)

## 2015-05-19 LAB — POCT SEDIMENTATION RATE: POCT SED RATE: 20 mm/hr (ref 0–22)

## 2015-05-19 LAB — APTT: APTT: 33 s (ref 24–37)

## 2015-05-19 LAB — C-REACTIVE PROTEIN

## 2015-05-19 LAB — VITAMIN B12: Vitamin B-12: 495 pg/mL (ref 200–1100)

## 2015-05-19 LAB — FERRITIN: FERRITIN: 16 ng/mL (ref 10–154)

## 2015-05-19 NOTE — Progress Notes (Signed)
Subjective:    Patient ID: Kelsey Black, female    DOB: Oct 07, 1990, 25 y.o.   MRN: [id number] By signing my name below, I, Javier Docker, attest that this documentation has been prepared under the direction and in the presence of Norberto Sorenson, MD. Electronically Signed: Javier Docker, ER Scribe. 05/19/2015. 1:16 PM.  Chief Complaint  Patient presents with  . Bleeding/Bruising    bruising all over of unknown origin     HPI HPI Comments: Kelsey Black is a 25 y.o. female who presents to [facility] reporting for a CPE. Kelsey Black was slightly anemic one year ago with hemoglobin of 12.5 two years ago dropped to 10.4, MCV was normal, and negative STI screening. She is also complaining of urinary urgency for the last three weeks as well as increased fatigue. She denies unexplained weight changes, or night sweats. She had one evert of dizziness where she felt like she was going to pass out, so she went to bed early and felt better in the morning other than having a mild HA. She notes she has had less of an appetite recently. She drinks a large amount of soda and juice daily. She has a HA once per week. She denies vision changes, but states that her vision is better in the morning and worse later in the day. She has been sleeping substantially more than usual, regularly sleeping for 12+ hours. She has occasional difficulty swallowing. She denies swollen glands under her arm pit or in any other area. She has heavy periods, that are unchanged from baseline. She is not taking any medications other than as-needed midol. She has a family hx of DM (grandfather), stroke (grandfather and grandmother). She denies slurred speech in the afternoon or difficulty eating or drinking in the afternoon. She denies heart racing or SOB.   She is complaining of random bruising unrelated to any injury that seem to pop up at random, often lasting for months at a time. They are not painful.   No past medical  history on file.  Allergies  Allergen Reactions  . Penicillins Other (See Comments)    Leg went numb when younger.  . Shrimp [Shellfish Allergy] Other (See Comments)    Hand puffed up when touched it.  Marland Kitchen Penicillins   . Shrimp [Shellfish Allergy]    No current outpatient prescriptions on file prior to visit.   No current facility-administered medications on file prior to visit.   Depression screen [facility] [place] 2/9 05/26/2015 05/19/2015  Decreased Interest 0 0  Down, Depressed, Hopeless 0 0  PHQ - 2 Score 0 0     Review of Systems  Constitutional: Positive for activity change (decrease), fatigue and unexpected weight change (gain). Negative for fever, chills and appetite change.  Eyes: Negative for visual disturbance.  Respiratory: Negative for apnea, cough, chest tightness and shortness of breath.   Cardiovascular: Negative for chest pain, palpitations and leg swelling.  Gastrointestinal: Negative for vomiting and abdominal pain.  Genitourinary: Positive for urgency. Negative for dysuria.  Musculoskeletal: Negative for joint swelling and gait problem.  Skin: Positive for color change and wound. Negative for rash.  Neurological: Positive for headaches. Negative for dizziness, tremors, seizures, syncope, speech difficulty, weakness, light-headedness and numbness.  Hematological: Negative for adenopathy. Bruises/bleeds easily.  Psychiatric/Behavioral: Positive for behavioral problems and decreased concentration. Negative for sleep disturbance, dysphoric mood and agitation. The patient is not nervous/anxious.       Objective:  BP 122/72 mmHg  Pulse 88  Temp(Src) 98.3 F (36.8 C) (Oral)  Resp 17  Ht 5' 4.5" (1.638 m)  Wt 168 lb (76.204 kg)  BMI 28.40 kg/m2  SpO2 99%  LMP 05/03/2015  Physical Exam  Constitutional: She is oriented to person, place, and time. She appears well-developed and well-nourished. No distress.  HENT:  Head: Normocephalic and atraumatic.  2+ tonsils at baseline.  TMs with small midear effusion bilaterally. Normal nares. No cervical or supraclavicular adenopathy. Thyroid normal.   Eyes: Pupils are equal, round, and reactive to light.  Neck: Neck supple.  Cardiovascular: Normal rate, regular rhythm and normal heart sounds.   No murmur heard. Pulmonary/Chest: Effort normal and breath sounds normal. No respiratory distress. She has no wheezes.  Lungs clear to ascultation, good air movement.   Abdominal: Soft. Bowel sounds are normal.  Normal bowel sounds.  Musculoskeletal: Normal range of motion.  2+ patellar and achilles DTRs. 2+ lower extremity pulses. 5/5 lower extremity strength bilaterally.   Neurological: She is alert and oriented to person, place, and time. Coordination normal.  Skin: Skin is warm and dry. She is not diaphoretic.  Vary old hyperpigmented ecchymoses along right lateral back under axilla. Tiny similar one on left. Several small spots on the bilateral flank. Larger hematoma approximately 4/1 inches with palpable underlying mass on medial aspect of right calf.   Psychiatric: She has a normal mood and affect. Her behavior is normal.  Nursing note and vitals reviewed.  Orthostatics negative. EKG showed normal sinus rhythm. No ischemic changes.      Assessment & Plan:   1. Other fatigue   2. Bruising   3. Microscopic hematuria   Unknown etiology so full lab w/u today, recheck if worsening or develops any other sxs.  Orders Placed This Encounter  Procedures  . C-reactive protein  . Thyroid Panel With TSH  . Epstein-Barr virus VCA antibody panel  . Comprehensive metabolic panel  . Vitamin B12  . VITAMIN D 25 Hydroxy (Vit-D Deficiency, Fractures)  . Protime-INR  . APTT  . Ferritin  . Orthostatic vital signs  . POCT urinalysis dipstick  . POCT Microscopic Urinalysis ([facility])  . POCT CBC  . POCT SEDIMENTATION RATE  . EKG 12-Lead   Over 45 min spent in face-to-face evaluation of and consultation with patient and coordination  of care.  Over 50% of this time was spent counseling this patient.  I personally performed the services described in this documentation, which was scribed in my presence. The recorded information has been reviewed and considered, and addended by me as needed.  Norberto SorensonEva Daven Montz, MD MPH

## 2015-05-19 NOTE — Patient Instructions (Addendum)
Dehydration, Adult Dehydration is a condition in which you do not have enough fluid or water in your body. It happens when you take in less fluid than you lose. Vital organs such as the kidneys, brain, and heart cannot function without a proper amount of fluids. Any loss of fluids from the body can cause dehydration.  Dehydration can range from mild to severe. This condition should be treated right away to help prevent it from becoming severe. CAUSES  This condition may be caused by:  Vomiting.  Diarrhea.  Excessive sweating, such as when exercising in hot or humid weather.  Not drinking enough fluid during strenuous exercise or during an illness.  Excessive urine output.  Fever.  Certain medicines. RISK FACTORS This condition is more likely to develop in:  People who are taking certain medicines that cause the body to lose excess fluid (diuretics).   People who have a chronic illness, such as diabetes, that may increase urination.  Older adults.   People who live at high altitudes.   People who participate in endurance sports.  SYMPTOMS  Mild Dehydration  Thirst.  Dry lips.  Slightly dry mouth.  Dry, warm skin. Moderate Dehydration  Very dry mouth.   Muscle cramps.   Dark urine and decreased urine production.   Decreased tear production.   Headache.   Light-headedness, especially when you stand up from a sitting position.  Severe Dehydration  Changes in skin.   Cold and clammy skin.   Skin does not spring back quickly when lightly pinched and released.   Changes in body fluids.   Extreme thirst.   No tears.   Not able to sweat when body temperature is high, such as in hot weather.   Minimal urine production.   Changes in vital signs.   Rapid, weak pulse (more than 100 beats per minute when you are sitting still).   Rapid breathing.   Low blood pressure.   Other changes.   Sunken eyes.   Cold hands and feet.    Confusion.  Lethargy and difficulty being awakened.  Fainting (syncope).   Short-term weight loss.   Unconsciousness. DIAGNOSIS  This condition may be diagnosed based on your symptoms. You may also have tests to determine how severe your dehydration is. These tests may include:   Urine tests.   Blood tests.  TREATMENT  Treatment for this condition depends on the severity. Mild or moderate dehydration can often be treated at home. Treatment should be started right away. Do not wait until dehydration becomes severe. Severe dehydration needs to be treated at the hospital. Treatment for Mild Dehydration  Drinking plenty of water to replace the fluid you have lost.   Replacing minerals in your blood (electrolytes) that you may have lost.  Treatment for Moderate Dehydration  Consuming oral rehydration solution (ORS). Treatment for Severe Dehydration  Receiving fluid through an IV tube.   Receiving electrolyte solution through a feeding tube that is passed through your nose and into your stomach (nasogastric tube or NG tube).  Correcting any abnormalities in electrolytes. HOME CARE INSTRUCTIONS   Drink enough fluid to keep your urine clear or pale yellow.   Drink water or fluid slowly by taking small sips. You can also try sucking on ice cubes.  Have food or beverages that contain electrolytes. Examples include bananas and sports drinks.  Take over-the-counter and prescription medicines only as told by your health care provider.   Prepare ORS according to the manufacturer's instructions. Take sips   of ORS every 5 minutes until your urine returns to normal.  If you have vomiting or diarrhea, continue to try to drink water, ORS, or both.   If you have diarrhea, avoid:   Beverages that contain caffeine.   Fruit juice.   Milk.   Carbonated soft drinks.  Do not take salt tablets. This can lead to the condition of having too much sodium in your body  (hypernatremia).  SEEK MEDICAL CARE IF:  You cannot eat or drink without vomiting.  You have had moderate diarrhea during a period of more than 24 hours.  You have a fever. SEEK IMMEDIATE MEDICAL CARE IF:   You have extreme thirst.  You have severe diarrhea.  You have not urinated in 6-8 hours, or you have urinated only a small amount of very dark urine.  You have shriveled skin.  You are dizzy, confused, or both.   This information is not intended to replace advice given to you by your health care provider. Make sure you discuss any questions you have with your health care provider.   Document Released: 02/02/2005 Document Revised: 10/24/2014 Document Reviewed: 06/20/2014 Elsevier Interactive Patient Education 2016 [employer].   IF you received an x-ray today, you will receive an invoice from [facility]. Please contact [facility] at [phone] with questions or concerns regarding your invoice.   IF you received labwork today, you will receive an invoice from [employer]. Please contact [facility] at [phone] with questions or concerns regarding your invoice.   Our billing staff will not be able to assist you with questions regarding bills from these companies.  You will be contacted with the lab results as soon as they are available. The fastest way to get your results is to activate your My Chart account. Instructions are located on the last page of this paperwork. If you have not heard from [place] regarding the results in 2 weeks, please contact this office.    Fatigue Fatigue is feeling tired all of the time, a lack of energy, or a lack of motivation. Occasional or mild fatigue is often a normal response to activity or life in general. However, long-lasting (chronic) or extreme fatigue may indicate an underlying medical condition. HOME CARE INSTRUCTIONS  Watch your fatigue for any changes. The following actions may help to  lessen any discomfort you are feeling:  Talk to your health care provider about how much sleep you need each night. Try to get the required amount every night.  Take medicines only as directed by your health care provider.  Eat a healthy and nutritious diet. Ask your health care provider if you need help changing your diet.  Drink enough fluid to keep your urine clear or pale yellow.  Practice ways of relaxing, such as yoga, meditation, massage therapy, or acupuncture.  Exercise regularly.   Change situations that cause you stress. Try to keep your work and personal routine reasonable.  Do not abuse illegal drugs.  Limit alcohol intake to no more than 1 drink per day for nonpregnant women and 2 drinks per day for men. One drink equals 12 ounces of beer, 5 ounces of wine, or 1 ounces of hard liquor.  Take a multivitamin, if directed by your health care provider. SEEK MEDICAL CARE IF:   Your fatigue does not get better.  You have a fever.   You have unintentional weight loss or gain.  You have headaches.   You have difficulty:   Falling asleep.  Sleeping throughout the night.  You feel angry, guilty, anxious, or sad.   You are unable to have a bowel movement (constipation).   You skin is dry.   Your legs or another part of your body is swollen.  SEEK IMMEDIATE MEDICAL CARE IF:   You feel confused.   Your vision is blurry.  You feel faint or pass out.   You have a severe headache.   You have severe abdominal, pelvic, or back pain.   You have chest pain, shortness of breath, or an irregular or fast heartbeat.   You are unable to urinate or you urinate less than normal.   You develop abnormal bleeding, such as bleeding from the rectum, vagina, nose, lungs, or nipples.  You vomit blood.   You have thoughts about harming yourself or committing suicide.   You are worried that you might harm someone else.    This information is not  intended to replace advice given to you by your health care provider. Make sure you discuss any questions you have with your health care provider.   Document Released: 11/30/2006 Document Revised: 02/23/2014 Document Reviewed: 06/06/2013 Elsevier Interactive Patient Education 2016 [employer]  Contusion A contusion is a deep bruise. Contusions are the result of a blunt injury to tissues and muscle fibers under the skin. The injury causes bleeding under the skin. The skin overlying the contusion may turn blue, purple, or yellow. Minor injuries will give you a painless contusion, but more severe contusions may stay painful and swollen for a few weeks.  CAUSES  This condition is usually caused by a blow, trauma, or direct force to an area of the body. SYMPTOMS  Symptoms of this condition include:  Swelling of the injured area.  Pain and tenderness in the injured area.  Discoloration. The area may have redness and then turn blue, purple, or yellow. DIAGNOSIS  This condition is diagnosed based on a physical exam and medical history. An X-ray, CT scan, or MRI may be needed to determine if there are any associated injuries, such as broken bones (fractures). TREATMENT  Specific treatment for this condition depends on what area of the body was injured. In general, the best treatment for a contusion is resting, icing, applying pressure to (compression), and elevating the injured area. This is often called the RICE strategy. Over-the-counter anti-inflammatory medicines may also be recommended for pain control.  HOME CARE INSTRUCTIONS   Rest the injured area.  If directed, apply ice to the injured area:  Put ice in a plastic bag.  Place a towel between your skin and the bag.  Leave the ice on for 20 minutes, 2-3 times per day.  If directed, apply light compression to the injured area using an elastic bandage. Make sure the bandage is not wrapped too tightly. Remove and reapply the bandage as  directed by your health care provider.  If possible, raise (elevate) the injured area above the level of your heart while you are sitting or lying down.  Take over-the-counter and prescription medicines only as told by your health care provider. SEEK MEDICAL CARE IF:  Your symptoms do not improve after several days of treatment.  Your symptoms get worse.  You have difficulty moving the injured area. SEEK IMMEDIATE MEDICAL CARE IF:   You have severe pain.  You have numbness in a hand or foot.  Your hand or foot turns pale or cold.   This information is not intended to replace advice given to  you by your health care provider. Make sure you discuss any questions you have with your health care provider.   Document Released: 11/12/2004 Document Revised: 10/24/2014 Document Reviewed: 06/20/2014 Elsevier Interactive Patient Education [employer]

## 2015-05-20 LAB — EPSTEIN-BARR VIRUS VCA ANTIBODY PANEL
EBV EA IgG: <5 U/mL (ref <9.0)
EBV NA IgG: 48 U/mL — ABNORMAL HIGH (ref <18.0)
EBV VCA IgG: 359 U/mL — ABNORMAL HIGH (ref <18.0)

## 2015-05-20 LAB — VITAMIN D 25 HYDROXY (VIT D DEFICIENCY, FRACTURES): Vit D, 25-Hydroxy: 13 ng/mL — ABNORMAL LOW (ref 30–100)

## 2015-05-26 ENCOUNTER — Ambulatory Visit (INDEPENDENT_AMBULATORY_CARE_PROVIDER_SITE_OTHER): Payer: PRIVATE HEALTH INSURANCE | Admitting: Family Medicine

## 2015-05-26 VITALS — BP 116/72 | HR 70 | Temp 98.1°F | Resp 20 | Ht 64.5 in | Wt 171.5 lb

## 2015-05-26 DIAGNOSIS — B360 Pityriasis versicolor: Secondary | ICD-10-CM

## 2015-05-26 DIAGNOSIS — E559 Vitamin D deficiency, unspecified: Secondary | ICD-10-CM | POA: Diagnosis not present

## 2015-05-26 DIAGNOSIS — D509 Iron deficiency anemia, unspecified: Secondary | ICD-10-CM | POA: Diagnosis not present

## 2015-05-26 DIAGNOSIS — R635 Abnormal weight gain: Secondary | ICD-10-CM

## 2015-05-26 DIAGNOSIS — R233 Spontaneous ecchymoses: Secondary | ICD-10-CM

## 2015-05-26 DIAGNOSIS — N920 Excessive and frequent menstruation with regular cycle: Secondary | ICD-10-CM

## 2015-05-26 DIAGNOSIS — R238 Other skin changes: Secondary | ICD-10-CM

## 2015-05-26 LAB — POC MICROSCOPIC URINALYSIS (UMFC): Mucus: ABSENT

## 2015-05-26 LAB — POCT URINALYSIS DIP (MANUAL ENTRY)
Bilirubin, UA: NEGATIVE
Glucose, UA: NEGATIVE
Ketones, POC UA: NEGATIVE
Nitrite, UA: NEGATIVE
PROTEIN UA: NEGATIVE
Spec Grav, UA: 1.015
UROBILINOGEN UA: 0.2
pH, UA: 7

## 2015-05-26 LAB — POCT URINE PREGNANCY: Preg Test, Ur: NEGATIVE

## 2015-05-26 NOTE — Patient Instructions (Addendum)
Use a shampoo with Selenium sulfide in it as a body wash for a week.   IF you received an x-ray today, you will receive an invoice from [facility]. Please contact [facility] at [phone] with questions or concerns regarding your invoice.   IF you received labwork today, you will receive an invoice from [employer]. Please contact [facility] at [phone] with questions or concerns regarding your invoice.   Our billing staff will not be able to assist you with questions regarding bills from these companies.  You will be contacted with the lab results as soon as they are available. The fastest way to get your results is to activate your My Chart account. Instructions are located on the last page of this paperwork. If you have not heard from [place] regarding the results in 2 weeks, please contact this office.    Tinea Versicolor Tinea versicolor is a common fungal infection of the skin. It causes a rash that appears as light or dark patches on the skin. The rash most often occurs on the chest, back, neck, or upper arms. This condition is more common during warm weather. Other than affecting how your skin looks, tinea versicolor usually does not cause other problems. In most cases, the infection goes away in a few weeks with treatment. It may take a few months for the patches on your skin to clear up. CAUSES Tinea versicolor occurs when a type of fungus that is normally present on the skin starts to overgrow. This fungus is a kind of yeast. The exact cause of the overgrowth is not known. This condition cannot be passed from one person to another (noncontagious). RISK FACTORS This condition is more likely to develop when certain factors are present, such as:  Heat and humidity.  Sweating too much.  Hormone changes.  Oily skin.  A weak defense (immune) system. SYMPTOMS Symptoms of this condition may include:  A rash on your skin that is made up  of light or dark patches. The rash may have:  Patches of tan or pink spots on light skin.  Patches of white or brown spots on dark skin.  Patches of skin that do not tan.  Well-marked edges.  Scales on the discolored areas.  Mild itching. DIAGNOSIS A health care provider can usually diagnose this condition by looking at your skin. During the exam, he or she may use ultraviolet light to help determine the extent of the infection. In some cases, a skin sample may be taken by scraping the rash. This sample will be viewed under a microscope to check for yeast overgrowth. TREATMENT Treatment for this condition may include:  Dandruff shampoo that is applied to the affected skin during showers or bathing.  Over-the-counter medicated skin cream, lotion, or soaps.  Prescription antifungal medicine in the form of skin cream or pills.  Medicine to help reduce itching. HOME CARE INSTRUCTIONS  Take medicines only as directed by your health care provider.  Apply dandruff shampoo to the affected area if told to do so by your health care provider. You may be instructed to scrub the affected skin for several minutes each day.  Do not scratch the affected area of skin.  Avoid hot and humid conditions.  Do not use tanning booths.  Try to avoid sweating a lot. SEEK MEDICAL CARE IF:  Your symptoms get worse.  You have a fever.  You have redness, swelling, or pain at the site of your rash.  You have  fluid, blood, or pus coming from your rash.  Your rash returns after treatment.   This information is not intended to replace advice given to you by your health care provider. Make sure you discuss any questions you have with your health care provider.   Document Released: 01/31/2000 Document Revised: 02/23/2014 Document Reviewed: 11/14/2013 Elsevier Interactive Patient Education 2016 Elsevier Inc.  Vitamin D Deficiency Vitamin D deficiency is when your body does not have enough vitamin  D. Vitamin D is important to your body for many reasons:  It helps the body to absorb two important minerals, called calcium and phosphorus.  It plays a role in bone health.  It may help to prevent some diseases, such as diabetes and multiple sclerosis.  It plays a role in muscle function, including heart function. You can get vitamin D by:  Eating foods that naturally contain vitamin D.  Eating or drinking milk or other dairy products that have vitamin D added to them.  Taking a vitamin D supplement or a multivitamin supplement that contains vitamin D.  Being in the sun. Your body naturally makes vitamin D when your skin is exposed to sunlight. Your body changes the sunlight into a form of the vitamin that the body can use. If vitamin D deficiency is severe, it can cause a condition in which your bones become soft. In adults, this condition is called osteomalacia. In children, this condition is called rickets. CAUSES Vitamin D deficiency may be caused by:  Not eating enough foods that contain vitamin D.  Not getting enough sun exposure.  Having certain digestive system diseases that make it difficult for your body to absorb vitamin D. These diseases include Crohn disease, chronic pancreatitis, and cystic fibrosis.  Having a surgery in which a part of the stomach or a part of the small intestine is removed.  Being obese.  Having chronic kidney disease or liver disease. RISK FACTORS This condition is more likely to develop in:  Older people.  People who do not spend much time outdoors.  People who live in a long-term care facility.  People who have had broken bones.  People with weak or thin bones (osteoporosis).  People who have a disease or condition that changes how the body absorbs vitamin D.  People who have dark skin.  People who take certain medicines, such as steroid medicines or certain seizure medicines.  People who are overweight or obese. SYMPTOMS In  mild cases of vitamin D deficiency, there may not be any symptoms. If the condition is severe, symptoms may include:  Bone pain.  Muscle pain.  Falling often.  Broken bones caused by a minor injury. DIAGNOSIS This condition is usually diagnosed with a blood test.  TREATMENT Treatment for this condition may depend on what caused the condition. Treatment options include:  Taking vitamin D supplements.  Taking a calcium supplement. Your health care provider will suggest what dose is best for you. HOME CARE INSTRUCTIONS  Take medicines and supplements only as told by your health care provider.  Eat foods that contain vitamin D. Choices include:  Fortified dairy products, cereals, or juices. Fortified means that vitamin D has been added to the food. Check the label on the package to be sure.  Fatty fish, such as salmon or trout.  Eggs.  Oysters.  Do not use a tanning bed.  Maintain a healthy weight. Lose weight, if needed.  Keep all follow-up visits as told by your health care provider. This is important.  SEEK MEDICAL CARE IF:  Your symptoms do not go away.  You feel like throwing up (nausea) or you throw up (vomit).  You have fewer bowel movements than usual or it is difficult for you to have a bowel movement (constipation).   This information is not intended to replace advice given to you by your health care provider. Make sure you discuss any questions you have with your health care provider.   Document Released: 04/27/2011 Document Revised: 10/24/2014 Document Reviewed: 06/20/2014 Elsevier Interactive Patient Education 2016 [employer].   Iron-Rich Diet Iron is a mineral that helps your body to produce hemoglobin. Hemoglobin is a protein in your red blood cells that carries oxygen to your body's tissues. Eating too little iron may cause you to feel weak and tired, and it can increase your risk for infection. Eating enough iron is necessary for your body's  metabolism, muscle function, and nervous system. Iron is naturally found in many foods. It can also be added to foods or fortified in foods. There are two types of dietary iron:  Heme iron. Heme iron is absorbed by the body more easily than nonheme iron. Heme iron is found in meat, poultry, and fish.  Nonheme iron. Nonheme iron is found in dietary supplements, iron-fortified grains, beans, and vegetables. You may need to follow an iron-rich diet if:  You have been diagnosed with iron deficiency or iron-deficiency anemia.  You have a condition that prevents you from absorbing dietary iron, such as:  Infection in your intestines.  Celiac disease. This involves long-lasting (chronic) inflammation of your intestines.  You do not eat enough iron.  You eat a diet that is high in foods that impair iron absorption.  You have lost a lot of blood.  You have heavy bleeding during your menstrual cycle.  You are pregnant. WHAT IS MY PLAN? Your health care provider may help you to determine how much iron you need per day based on your condition. Generally, when a person consumes sufficient amounts of iron in the diet, the following iron needs are met:  Men.  10-59 years old: 11 mg per day.  55-45 years old: 8 mg per day.  Women.   53-63 years old: 15 mg per day.  35-2 years old: 18 mg per day.  Over 82 years old: 8 mg per day.  Pregnant women: 27 mg per day.  Breastfeeding women: 9 mg per day. WHAT DO I NEED TO KNOW ABOUT AN IRON-RICH DIET?  Eat fresh fruits and vegetables that are high in vitamin C along with foods that are high in iron. This will help increase the amount of iron that your body absorbs from food, especially with foods containing nonheme iron. Foods that are high in vitamin C include oranges, peppers, tomatoes, and mango.  Take iron supplements only as directed by your health care provider. Overdose of iron can be life-threatening. If you were prescribed iron  supplements, take them with orange juice or a vitamin C supplement.  Cook foods in pots and pans that are made from iron.   Eat nonheme iron-containing foods alongside foods that are high in heme iron. This helps to improve your iron absorption.   Certain foods and drinks contain compounds that impair iron absorption. Avoid eating these foods in the same meal as iron-rich foods or with iron supplements. These include:  Coffee, black tea, and red wine.  Milk, dairy products, and foods that are high in calcium.  Beans, soybeans, and peas.  Whole grains.  When eating foods that contain both nonheme iron and compounds that impair iron absorption, follow these tips to absorb iron better.   Soak beans overnight before cooking.  Soak whole grains overnight and drain them before using.  Ferment flours before baking, such as using yeast in bread dough. WHAT FOODS CAN I EAT? Grains Iron-fortified breakfast cereal. Iron-fortified whole-wheat bread. Enriched rice. Sprouted grains. Vegetables Spinach. Potatoes with skin. Green peas. Broccoli. Red and green bell peppers. Fermented vegetables. Fruits Prunes. Raisins. Oranges. Strawberries. Mango. Grapefruit. Meats and Other Protein Sources Beef liver. Oysters. Beef. Shrimp. [place]. Chicken. [place]. Sardines. Chickpeas. Nuts. Tofu. Beverages Tomato juice. Fresh orange juice. Prune juice. Hibiscus tea. Fortified instant breakfast shakes. Condiments Tahini. Fermented soy sauce. Sweets and Desserts Black-strap molasses.  Other Wheat germ. The items listed above may not be a complete list of recommended foods or beverages. Contact your dietitian for more options. WHAT FOODS ARE NOT RECOMMENDED? Grains Whole grains. Bran cereal. Bran flour. Oats. Vegetables Artichokes. Brussels sprouts. Kale. Fruits Blueberries. Raspberries. Strawberries. Figs. Meats and Other Protein Sources Soybeans. Products made from soy  protein. Dairy Milk. Cream. Cheese. Yogurt. Cottage cheese. Beverages Coffee. Black tea. Red wine. Sweets and Desserts Cocoa. Chocolate. Ice cream. Other Basil. Oregano. Parsley. The items listed above may not be a complete list of foods and beverages to avoid. Contact your dietitian for more information.   This information is not intended to replace advice given to you by your health care provider. Make sure you discuss any questions you have with your health care provider.   Document Released: 09/16/2004 Document Revised: 02/23/2014 Document Reviewed: 08/30/2013 Elsevier Interactive Patient Education 2016 [employer].  Iron Deficiency Anemia, Adult Anemia is a condition in which there are less red blood cells or hemoglobin in the blood than normal. Hemoglobin is the part of red blood cells that carries oxygen. Iron deficiency anemia is anemia caused by too little iron. It is the most common type of anemia. It may leave you tired and short of breath. CAUSES   Lack of iron in the diet.  Poor absorption of iron, as seen with intestinal disorders.  Intestinal bleeding.  Heavy periods. SIGNS AND SYMPTOMS  Mild anemia may not be noticeable. Symptoms may include:  Fatigue.  Headache.  Pale skin.  Weakness.  Tiredness.  Shortness of breath.  Dizziness.  Cold hands and feet.  Fast or irregular heartbeat. DIAGNOSIS  Diagnosis requires a thorough evaluation and physical exam by your health care provider. Blood tests are generally used to confirm iron deficiency anemia. Additional tests may be done to find the underlying cause of your anemia. These may include:  Testing for blood in the stool (fecal occult blood test).  A procedure to see inside the colon and rectum (colonoscopy).  A procedure to see inside the esophagus and stomach (endoscopy). TREATMENT  Iron deficiency anemia is treated by correcting the cause of the deficiency. Treatment may involve:  Adding  iron-rich foods to your diet.  Taking iron supplements. Pregnant or breastfeeding women need to take extra iron because their normal diet usually does not provide the required amount.  Taking vitamins. Vitamin C improves the absorption of iron. Your health care provider may recommend that you take your iron tablets with a glass of orange juice or vitamin C supplement.  Medicines to make heavy menstrual flow lighter.  Surgery. HOME CARE INSTRUCTIONS   Take iron as directed by your health care provider.  If you cannot tolerate taking iron  supplements by mouth, talk to your health care provider about taking them through a vein (intravenously) or an injection into a muscle.  For the best iron absorption, iron supplements should be taken on an empty stomach. If you cannot tolerate them on an empty stomach, you may need to take them with food.  Do not drink milk or take antacids at the same time as your iron supplements. Milk and antacids may interfere with the absorption of iron.  Iron supplements can cause constipation. Make sure to include fiber in your diet to prevent constipation. A stool softener may also be recommended.  Take vitamins as directed by your health care provider.  Eat a diet rich in iron. Foods high in iron include liver, lean beef, whole-grain bread, eggs, dried fruit, and dark green leafy vegetables. SEEK IMMEDIATE MEDICAL CARE IF:   You faint. If this happens, do not drive. Call your local emergency services (911 in [place] if no other help is available.  You have chest pain.  You feel nauseous or vomit.  You have severe or increased shortness of breath with activity.  You feel weak.  You have a rapid heartbeat.  You have unexplained sweating.  You become light-headed when getting up from a chair or bed. MAKE SURE YOU:   Understand these instructions.  Will watch your condition.  Will get help right away if you are not doing well or get worse.   This  information is not intended to replace advice given to you by your health care provider. Make sure you discuss any questions you have with your health care provider.   Document Released: 01/31/2000 Document Revised: 02/23/2014 Document Reviewed: 10/10/2012 Elsevier Interactive Patient Education [employer].

## 2015-06-07 NOTE — Progress Notes (Signed)
Subjective:    Patient ID: Jefm Bryant Colon, female    DOB: [birth year], 25 y.o.   MRN: [id number] Chief Complaint  Patient presents with  . follow up    recheck with Dr. Clelia Croft for fatigue  last ov on 4/2    HPI  Ashlinn is here to follow-up on the general systemic sxs and work-up that she presented with last week.  She is accompanied by her mom today.  She is complaining of easy/sponteous bruising, fatigue, weight gain, urinary urgency, vision blurriness started in the evenings, occ dysphagia, decreased appetite, . She has been sleeping >12hrs/night w/o feeling restless.  She reports her sxs are relatively unchanged since last wk.   She has a h/o IDA and has started on an iron supp. Did have 1 presyncopal episode sev wks prior.  Her mother notes that she does not have a  Very good diet - lots of processed foods, and does not get any exercise.  Often will not go to sleep until quite late at night.  Depression screen [facility] 2/9 05/26/2015 05/19/2015  Decreased Interest 0 0  Down, Depressed, Hopeless 0 0  PHQ - 2 Score 0 0   History reviewed. No pertinent past medical history. Past Surgical History  Procedure Laterality Date  . No past surgeries     No current outpatient prescriptions on file prior to visit.   No current facility-administered medications on file prior to visit.   Allergies  Allergen Reactions  . Penicillins Other (See Comments)    Leg went numb when younger.  . Shrimp [Shellfish Allergy] Other (See Comments)    Hand puffed up when touched it.  Marland Kitchen Penicillins   . Shrimp [Shellfish Allergy]    Family History  Problem Relation Age of Onset  . Asthma Mother   . Asthma Sister   . Asthma Brother    Social History   Social History  . Marital Status: Single    Spouse Name: N/A  . Number of Children: N/A  . Years of Education: N/A   Social History Main Topics  . Smoking status: Never Smoker   . Smokeless tobacco: None  . Alcohol Use: No     Comment:  wine  . Drug Use: No  . Sexual Activity: Yes    Birth Control/ Protection: Condom   Other Topics Concern  . None   Social History Narrative   ** Merged History Encounter **           Review of Systems  Constitutional: Positive for activity change, appetite change, fatigue and unexpected weight change (gain). Negative for fever, chills and diaphoresis.  HENT: Positive for trouble swallowing. Negative for congestion, postnasal drip, rhinorrhea, sinus pressure and voice change.   Eyes: Positive for visual disturbance. Negative for photophobia and discharge.  Cardiovascular: Negative for chest pain, palpitations and leg swelling.  Endocrine: Negative for polydipsia, polyphagia and polyuria.  Genitourinary: Positive for urgency and menstrual problem. Negative for dysuria, frequency, hematuria, vaginal bleeding, vaginal discharge, difficulty urinating and pelvic pain.  Allergic/Immunologic: Negative for food allergies and immunocompromised state.  Neurological: Positive for light-headedness. Negative for dizziness, syncope and weakness.  Hematological: Negative for adenopathy. Bruises/bleeds easily.  Psychiatric/Behavioral: Negative for confusion, sleep disturbance and dysphoric mood. The patient is not nervous/anxious.        Objective:  BP 116/72 mmHg  Pulse 70  Temp(Src) 98.1 F (36.7 C) (Oral)  Resp 20  Ht 5' 4.5" (1.638 m)  Wt 171 lb 8 oz (77.792  kg)  BMI 28.99 kg/m2  SpO2 96%  LMP 05/03/2015  Physical Exam  Constitutional: She is oriented to person, place, and time. She appears well-developed and well-nourished. No distress.  HENT:  Head: Normocephalic and atraumatic.  Right Ear: External ear normal.  Left Ear: External ear normal.  Eyes: Conjunctivae are normal. No scleral icterus.  Neck: Normal range of motion. Neck supple. No thyromegaly present.  Cardiovascular: Normal rate, regular rhythm, normal heart sounds and intact distal pulses.   Pulmonary/Chest: Effort  normal and breath sounds normal. No respiratory distress.  Abdominal: Normal appearance. There is no CVA tenderness.  Musculoskeletal: She exhibits no edema.  Lymphadenopathy:    She has no cervical adenopathy.  Neurological: She is alert and oriented to person, place, and time.  Skin: Skin is warm and dry. She is not diaphoretic. No erythema.  Psychiatric: She has a normal mood and affect. Her behavior is normal.          Results for orders placed or performed in visit on 05/26/15  POCT urinalysis dipstick  Result Value Ref Range   Color, UA straw (A) yellow   Clarity, UA clear clear   Glucose, UA negative negative   Bilirubin, UA negative negative   Ketones, POC UA negative negative   Spec Grav, UA 1.015    Blood, UA small (A) negative   pH, UA 7.0    Protein Ur, POC negative negative   Urobilinogen, UA 0.2    Nitrite, UA Negative Negative   Leukocytes, UA Trace (A) Negative  POCT Microscopic Urinalysis ([facility])  Result Value Ref Range   WBC,UR,HPF,POC None None WBC/hpf   RBC,UR,HPF,POC None None RBC/hpf   Bacteria Few (A) None, Too numerous to count   Mucus Absent Absent   Epithelial Cells, UR Per Microscopy Few (A) None, Too numerous to count cells/hpf  POCT urine pregnancy  Result Value Ref Range   Preg Test, Ur Negative Negative    Assessment & Plan:   1. Menorrhagia with regular cycle - chronic and unchanged  2. Weight gain - start exercise, increases veggies in diet.  3. Easy bruising   4. Vitamin D deficiency - start replacement   5. Anemia, iron deficiency - cont iron supp, increase iron in diet  6. Tinea versicolor - selenium sulfide shampoo as body wash x 5d   Cut down on juice, soda, increase H2O.  Extensive lab w/u does not show any sig abnml - pt reassured, cont to monitor sxs. Unknown etiology of pt's many generalized sxs - start vit supp, watchful waiting - try to journal or to associate sxs, identify exac and relieving factors, etc. RTC if sxs cont  or progress.  Orders Placed This Encounter  Procedures  . POCT urinalysis dipstick  . POCT Microscopic Urinalysis ([facility])  . POCT urine pregnancy     Norberto SorensonEva Shaw, MD MPH
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
## 2016-05-27 DIAGNOSIS — Z0000 Encounter for general adult medical examination without abnormal findings: Secondary | ICD-10-CM | POA: Insufficient documentation

## 2016-07-01 ENCOUNTER — Encounter: Payer: Self-pay | Admitting: Gynecology

## 2016-08-01 ENCOUNTER — Inpatient Hospital Stay (HOSPITAL_COMMUNITY): Payer: Medicaid Other

## 2016-08-01 ENCOUNTER — Encounter (HOSPITAL_COMMUNITY): Payer: Self-pay | Admitting: *Deleted

## 2016-08-01 ENCOUNTER — Inpatient Hospital Stay (HOSPITAL_COMMUNITY)
Admission: AD | Admit: 2016-08-01 | Discharge: 2016-08-01 | Disposition: A | Discharge status: Home / Self Care | Payer: Medicaid Other | Source: Ambulatory Visit | Attending: Obstetrics and Gynecology | Admitting: Obstetrics and Gynecology

## 2016-08-01 DIAGNOSIS — Z91013 Allergy to seafood: Secondary | ICD-10-CM | POA: Insufficient documentation

## 2016-08-01 DIAGNOSIS — O3680X0 Pregnancy with inconclusive fetal viability, not applicable or unspecified: Secondary | ICD-10-CM

## 2016-08-01 DIAGNOSIS — Z3A09 9 weeks gestation of pregnancy: Secondary | ICD-10-CM | POA: Diagnosis not present

## 2016-08-01 DIAGNOSIS — Z880 Allergy status to penicillin: Secondary | ICD-10-CM | POA: Diagnosis not present

## 2016-08-01 DIAGNOSIS — O26899 Other specified pregnancy related conditions, unspecified trimester: Secondary | ICD-10-CM

## 2016-08-01 DIAGNOSIS — O26891 Other specified pregnancy related conditions, first trimester: Secondary | ICD-10-CM | POA: Diagnosis not present

## 2016-08-01 DIAGNOSIS — R109 Unspecified abdominal pain: Secondary | ICD-10-CM | POA: Insufficient documentation

## 2016-08-01 DIAGNOSIS — O9989 Other specified diseases and conditions complicating pregnancy, childbirth and the puerperium: Secondary | ICD-10-CM

## 2016-08-01 HISTORY — DX: Anemia, unspecified: D64.9

## 2016-08-01 LAB — WET PREP, GENITAL
Clue Cells Wet Prep HPF POC: NONE SEEN
Sperm: NONE SEEN
Trich, Wet Prep: NONE SEEN
YEAST WET PREP: NONE SEEN

## 2016-08-01 LAB — ABO/RH: ABO/RH(D): O POS

## 2016-08-01 LAB — URINALYSIS, ROUTINE W REFLEX MICROSCOPIC
BILIRUBIN URINE: NEGATIVE
Glucose, UA: NEGATIVE mg/dL
Ketones, ur: NEGATIVE mg/dL
LEUKOCYTES UA: NEGATIVE
Nitrite: NEGATIVE
PH: 6 (ref 5.0–8.0)
Protein, ur: NEGATIVE mg/dL
SPECIFIC GRAVITY, URINE: 1.017 (ref 1.005–1.030)

## 2016-08-01 LAB — CBC
HEMATOCRIT: 37.4 % (ref 36.0–46.0)
HEMOGLOBIN: 12.1 g/dL (ref 12.0–15.0)
MCH: 27.8 pg (ref 26.0–34.0)
MCHC: 32.4 g/dL (ref 30.0–36.0)
MCV: 85.8 fL (ref 78.0–100.0)
Platelets: 296 10*3/uL (ref 150–400)
RBC: 4.36 MIL/uL (ref 3.87–5.11)
RDW: 15 % (ref 11.5–15.5)
WBC: 6.1 10*3/uL (ref 4.0–10.5)

## 2016-08-01 LAB — HCG, QUANTITATIVE, PREGNANCY: HCG, BETA CHAIN, QUANT, S: 8398 m[IU]/mL — AB (ref <5)

## 2016-08-01 LAB — POCT PREGNANCY, URINE: PREG TEST UR: POSITIVE — AB

## 2016-08-01 NOTE — MAU Note (Signed)
C/o constant abdominal cramping that started today @ 1100; went to the pool yesterday and swam;  C/o diarrhea this week; c/o sore throat since this AM;

## 2016-08-01 NOTE — MAU Provider Note (Signed)
History     CSN: [id number]  Arrival date and time: 08/01/16 1148  First Provider Initiated Contact with Patient 08/01/16 1247      Chief Complaint  Patient presents with  . Abdominal Pain   HPI Kelsey Black is a 26 y.o. G1P0000 at [redacted]w[redacted]d by unsure LMP who presents with abdominal pain. Reports abdominal cramping for the last week that has worsened since this morning. Rates pain 8/10. Has not treated. Nothing makes better or worse. Denies n/v, constipation, dysuria, vaginal bleeding, fever/chills, or vaginal discharge. Reports 2-3 loose stools per day for the last week.   OB History    Gravida Para Term Preterm AB Living   1 0 0 0 0     SAB TAB Ectopic Multiple Live Births   0 0 0          Past Medical History:  Diagnosis Date  . Anemia     Past Surgical History:  Procedure Laterality Date  . NO PAST SURGERIES      Family History  Problem Relation Age of Onset  . Asthma Mother   . Asthma Sister   . Asthma Brother     Social History  Substance Use Topics  . Smoking status: Never Smoker  . Smokeless tobacco: Never Used  . Alcohol use No     Comment: wine    Allergies:  Allergies  Allergen Reactions  . Penicillins Swelling and Other (See Comments)    Reaction:  Arm/leg swelling  Has patient had a PCN reaction causing immediate rash, facial/tongue/throat swelling, SOB or lightheadedness with hypotension: Yes Has patient had a PCN reaction causing severe rash involving mucus membranes or skin necrosis: No Has patient had a PCN reaction that required hospitalization: No Has patient had a PCN reaction occurring within the last 10 years: No If all of the above answers are "NO", then may proceed with Cephalosporin use.  . Shrimp [Shellfish Allergy] Swelling and Other (See Comments)    Reaction:  Localized swelling    No prescriptions prior to admission.    Review of Systems  Constitutional: Negative.   HENT: Positive for sore throat. Negative for  congestion.   Respiratory: Negative for cough.   Gastrointestinal: Positive for abdominal pain and diarrhea. Negative for constipation, nausea and vomiting.  Genitourinary: Negative for dysuria, vaginal bleeding and vaginal discharge.   Physical Exam   Blood pressure 117/62, pulse 83, temperature 98.6 F (37 C), resp. rate 16, height 5\' 5"  (1.651 m), weight 186 lb (84.4 kg), last menstrual period 05/26/2016.  Physical Exam  Nursing note and vitals reviewed. Constitutional: She is oriented to person, place, and time. She appears well-developed and well-nourished. No distress.  HENT:  Head: Normocephalic and atraumatic.  Eyes: Conjunctivae are normal. Right eye exhibits no discharge. Left eye exhibits no discharge. No scleral icterus.  Neck: Normal range of motion.  Cardiovascular: Normal rate, regular rhythm and normal heart sounds.   No murmur heard. Respiratory: Effort normal and breath sounds normal. No respiratory distress. She has no wheezes.  GI: Soft. Bowel sounds are normal. She exhibits no distension. There is tenderness in the suprapubic area. There is no rigidity, no rebound and no guarding.  Genitourinary: Uterus normal. Cervix exhibits no motion tenderness. Right adnexum displays no mass and no tenderness. Left adnexum displays no mass and no tenderness.  Neurological: She is alert and oriented to person, place, and time.  Skin: Skin is warm and dry. She is not diaphoretic.  Psychiatric: She has  a normal mood and affect. Her behavior is normal. Judgment and thought content normal.    MAU Course  Procedures Results for orders placed or performed during the hospital encounter of 08/01/16 (from the past 24 hour(s))  Urinalysis, Routine w reflex microscopic     Status: Abnormal   Collection Time: 08/01/16 12:08 PM  Result Value Ref Range   Color, Urine YELLOW YELLOW   APPearance HAZY (A) CLEAR   Specific Gravity, Urine 1.017 1.005 - 1.030   pH 6.0 5.0 - 8.0   Glucose, UA  NEGATIVE NEGATIVE mg/dL   Hgb urine dipstick SMALL (A) NEGATIVE   Bilirubin Urine NEGATIVE NEGATIVE   Ketones, ur NEGATIVE NEGATIVE mg/dL   Protein, ur NEGATIVE NEGATIVE mg/dL   Nitrite NEGATIVE NEGATIVE   Leukocytes, UA NEGATIVE NEGATIVE   RBC / HPF 0-5 0 - 5 RBC/hpf   WBC, UA 0-5 0 - 5 WBC/hpf   Bacteria, UA RARE (A) NONE SEEN   Squamous Epithelial / LPF 6-30 (A) NONE SEEN   Mucous PRESENT   Pregnancy, urine POC     Status: Abnormal   Collection Time: 08/01/16 12:25 PM  Result Value Ref Range   Preg Test, Ur POSITIVE (A) NEGATIVE  CBC     Status: None   Collection Time: 08/01/16 12:58 PM  Result Value Ref Range   WBC 6.1 4.0 - 10.5 K/uL   RBC 4.36 3.87 - 5.11 MIL/uL   Hemoglobin 12.1 12.0 - 15.0 g/dL   HCT 16.137.4 09.636.0 - 04.546.0 %   MCV 85.8 78.0 - 100.0 fL   MCH 27.8 26.0 - 34.0 pg   MCHC 32.4 30.0 - 36.0 g/dL   RDW 40.915.0 81.111.5 - 91.415.5 %   Platelets 296 150 - 400 K/uL  ABO/Rh     Status: None (Preliminary result)   Collection Time: 08/01/16 12:58 PM  Result Value Ref Range   ABO/RH(D) O POS   hCG, quantitative, pregnancy     Status: Abnormal   Collection Time: 08/01/16 12:58 PM  Result Value Ref Range   hCG, Beta Chain, Quant, S 8,398 (H) <5 mIU/mL  Wet prep, genital     Status: Abnormal   Collection Time: 08/01/16  1:00 PM  Result Value Ref Range   Yeast Wet Prep HPF POC NONE SEEN NONE SEEN   Trich, Wet Prep NONE SEEN NONE SEEN   Clue Cells Wet Prep HPF POC NONE SEEN NONE SEEN   WBC, Wet Prep HPF POC FEW (A) NONE SEEN   Sperm NONE SEEN    [place] Ob Comp Less 14 Wks  Result Date: 08/01/2016 CLINICAL DATA:  Cramping since 11 a.m. Uncertain last menstrual period. EXAM: OBSTETRIC <14 WK US AND TRANSVAGINAL OB US TECHNIQUE: Both transabdominal and transvaginal ultrasound examinations were performed for complete evaluation of the gestation as well as the maternal uterus, adnexal regions, and pelvic cul-de-sac. Transvaginal technique was performed to assess early pregnancy. COMPARISON:   Pelvic ultrasound 01/19/2011 FINDINGS: Intrauterine gestational sac: Present Yolk sac:  Not seen Embryo:  Not seen MSD: 5.7 mm  mm   5 w   2  d Subchorionic hemorrhage:  None visualized. Maternal uterus/adnexae: Normal. No free pelvic fluid. IMPRESSION: Asymmetrically positioned 5.7 mm cystic structure within the endometrium of the right horn of the uterine fundus, which may represent an early gestational sac. Probable early intrauterine gestational sac, but no yolk sac, fetal pole, or cardiac activity yet visualized. Recommend follow-up quantitative B-HCG levels and follow-up US in 14 days to assess  viability. This recommendation follows SRU consensus guidelines: Diagnostic Criteria for Nonviable Pregnancy Early in the First Trimester. Malva Limes Med 2013; 960:4540-98. Electronically Signed   By: Ted Mcalpine M.D.   On: 08/01/2016 14:29   US Ob Transvaginal  Result Date: 08/01/2016 CLINICAL DATA:  Cramping since 11 a.m. Uncertain last menstrual period. EXAM: OBSTETRIC <14 WK [place] AND TRANSVAGINAL OB US TECHNIQUE: Both transabdominal and transvaginal ultrasound examinations were performed for complete evaluation of the gestation as well as the maternal uterus, adnexal regions, and pelvic cul-de-sac. Transvaginal technique was performed to assess early pregnancy. COMPARISON:  Pelvic ultrasound 01/19/2011 FINDINGS: Intrauterine gestational sac: Present Yolk sac:  Not seen Embryo:  Not seen MSD: 5.7 mm  mm   5 w   2  d Subchorionic hemorrhage:  None visualized. Maternal uterus/adnexae: Normal. No free pelvic fluid. IMPRESSION: Asymmetrically positioned 5.7 mm cystic structure within the endometrium of the right horn of the uterine fundus, which may represent an early gestational sac. Probable early intrauterine gestational sac, but no yolk sac, fetal pole, or cardiac activity yet visualized. Recommend follow-up quantitative B-HCG levels and follow-up US in 14 days to assess viability. This recommendation  follows SRU consensus guidelines: Diagnostic Criteria for Nonviable Pregnancy Early in the First Trimester. Malva Limes Med 2013; 119:1478-29. Electronically Signed   By: Ted Mcalpine M.D.   On: 08/01/2016 14:29    MDM +UPT UA, wet prep, GC/chlamydia, CBC, ABO/Rh, quant hCG, HIV, and [place] today to rule out ectopic pregnancy O positive BHCG 8398 Ultrasound shows probable IUGS, no yolk sac Reviewed BHCG & ultrasound with Dr. Vergie Living. Will have patient return to repeat bhcg in 48 hours & repeat ultrasound in 2 weeks.  Assessment and Plan  A: 1. Pregnancy of unknown anatomic location   2. Abdominal pain affecting pregnancy    P: Discharge home Return to MAU Tuesday after work for repeat BHCG Discussed reasons to return to MAU GC/CT pending  Judeth Horn 08/01/2016, 12:46 PM

## 2016-08-01 NOTE — Discharge Instructions (Signed)
Return to care   If you have heavier bleeding that soaks through more that 2 pads per hour for an hour or more  If you bleed so much that you feel like you might pass out or you do pass out  If you have significant abdominal pain that is not improved with Tylenol   If you develop a fever > 100.5   Safe Medications in Pregnancy   Acne: Benzoyl Peroxide Salicylic Acid  Backache/Headache: Tylenol: 2 regular strength every 4 hours OR              2 Extra strength every 6 hours  Colds/Coughs/Allergies: Benadryl (alcohol free) 25 mg every 6 hours as needed Breath right strips Claritin Cepacol throat lozenges Chloraseptic throat spray Cold-Eeze- up to three times per day Cough drops, alcohol free Flonase (by prescription only) Guaifenesin Mucinex Robitussin DM (plain only, alcohol free) Saline nasal spray/drops Sudafed (pseudoephedrine) & Actifed ** use only after [redacted] weeks gestation and if you do not have high blood pressure Tylenol Vicks Vaporub Zinc lozenges Zyrtec   Constipation: Colace Ducolax suppositories Fleet enema Glycerin suppositories Metamucil Milk of magnesia Miralax Senokot Smooth move tea  Diarrhea: Kaopectate Imodium A-D  *NO pepto Bismol  Hemorrhoids: Anusol Anusol HC Preparation H Tucks  Indigestion: Tums Maalox Mylanta Zantac  Pepcid  Insomnia: Benadryl (alcohol free) 25mg every 6 hours as needed Tylenol PM Unisom, no Gelcaps  Leg Cramps: Tums MagGel  Nausea/Vomiting:  Bonine Dramamine Emetrol Ginger extract Sea bands Meclizine  Nausea medication to take during pregnancy:  Unisom (doxylamine succinate 25 mg tablets) Take one tablet daily at bedtime. If symptoms are not adequately controlled, the dose can be increased to a maximum recommended dose of two tablets daily (1/2 tablet in the morning, 1/2 tablet mid-afternoon and one at bedtime). Vitamin B6 100mg tablets. Take one tablet twice a day (up to 200 mg per  day).  Skin Rashes: Aveeno products Benadryl cream or 25mg every 6 hours as needed Calamine Lotion 1% cortisone cream  Yeast infection: Gyne-lotrimin 7 Monistat 7  Gum/tooth pain: Anbesol  **If taking multiple medications, please check labels to avoid duplicating the same active ingredients **take medication as directed on the label ** Do not exceed 4000 mg of tylenol in 24 hours **Do not take medications that contain aspirin or ibuprofen     

## 2016-08-01 NOTE — MAU Note (Signed)
Pt presents to MAU with complaints of lower abdominal cramping, diarrhea since positive home pregnancy test on June the 7th. Denies any VB or abnormal discharge

## 2016-08-02 LAB — HIV ANTIBODY (ROUTINE TESTING W REFLEX): HIV SCREEN 4TH GENERATION: NONREACTIVE

## 2016-08-03 LAB — OB RESULTS CONSOLE ABO/RH: RH Type: POSITIVE

## 2016-08-03 LAB — GC/CHLAMYDIA PROBE AMP (~~LOC~~) NOT AT ARMC
Chlamydia: NEGATIVE
Neisseria Gonorrhea: NEGATIVE

## 2016-08-03 LAB — OB RESULTS CONSOLE RPR: RPR: NONREACTIVE

## 2016-08-03 LAB — OB RESULTS CONSOLE ANTIBODY SCREEN: Antibody Screen: NEGATIVE

## 2016-08-04 ENCOUNTER — Inpatient Hospital Stay (HOSPITAL_COMMUNITY)
Admission: AD | Admit: 2016-08-04 | Discharge: 2016-08-04 | Disposition: A | Discharge status: Home / Self Care | Payer: Medicaid Other | Source: Ambulatory Visit | Attending: Family Medicine | Admitting: Family Medicine

## 2016-08-04 DIAGNOSIS — O3680X0 Pregnancy with inconclusive fetal viability, not applicable or unspecified: Secondary | ICD-10-CM | POA: Diagnosis not present

## 2016-08-04 DIAGNOSIS — Z3A00 Weeks of gestation of pregnancy not specified: Secondary | ICD-10-CM | POA: Insufficient documentation

## 2016-08-04 DIAGNOSIS — O26891 Other specified pregnancy related conditions, first trimester: Secondary | ICD-10-CM

## 2016-08-04 DIAGNOSIS — R109 Unspecified abdominal pain: Secondary | ICD-10-CM | POA: Diagnosis not present

## 2016-08-04 DIAGNOSIS — O26899 Other specified pregnancy related conditions, unspecified trimester: Secondary | ICD-10-CM

## 2016-08-04 LAB — HCG, QUANTITATIVE, PREGNANCY: hCG, Beta Chain, Quant, S: 15301 m[IU]/mL — ABNORMAL HIGH (ref <5)

## 2016-08-04 NOTE — MAU Provider Note (Signed)
History    None     Chief Complaint:  Follow-up   Kelsey Black is  26 y.o. G1P0000 Patient's last menstrual period was 05/26/2016.Marland Kitchen Patient is here for follow up of quantitative HCG and ongoing surveillance of pregnancy status.   She is [redacted]w[redacted]d weeks gestation  by LMP.    Since her last visit, the patient is without new complaint.     ROS Abdomin Pain: Denies Vaginal bleeding: none now.   Passage of clots or tissue: Denies Dizziness: Denies  O POS  Her previous Quantitative HCG values are:  Results for Kelsey Black, Kelsey Black (MRN [id number]) as of 08/04/2016 17:49  Ref. Range 08/01/2016 12:58  HCG, Beta Chain, Quant, S Latest Ref Range: <5 mIU/mL 8,398 (H)    Physical Exam   Patient Vitals for the past 24 hrs:  BP Temp Temp src Pulse Resp SpO2 Height Weight  08/04/16 1637 (!) 120/58 98.5 F (36.9 C) Oral 82 16 100 % 5\' 5"  (1.651 m) 186 lb (84.4 kg)   Constitutional: Well-nourished female in no apparent distress. No pallor Neuro: Alert and oriented 4 Cardiovascular: Normal rate Respiratory: Normal effort and rate Abdomen: Soft, nontender Gynecological Exam: examination not indicated  Labs: Results for orders placed or performed during the hospital encounter of 08/04/16 (from the past 24 hour(s))  hCG, quantitative, pregnancy   Collection Time: 08/04/16  4:40 PM  Result Value Ref Range   hCG, Beta Chain, Quant, S 15,301 (H) <5 mIU/mL    Ultrasound Studies:   US Ob Comp Less 14 Wks  Result Date: 08/01/2016 CLINICAL DATA:  Cramping since 11 a.m. Uncertain last menstrual period. EXAM: OBSTETRIC <14 WK [place] AND TRANSVAGINAL OB US TECHNIQUE: Both transabdominal and transvaginal ultrasound examinations were performed for complete evaluation of the gestation as well as the maternal uterus, adnexal regions, and pelvic cul-de-sac. Transvaginal technique was performed to assess early pregnancy. COMPARISON:  Pelvic ultrasound 01/19/2011 FINDINGS: Intrauterine gestational  sac: Present Yolk sac:  Not seen Embryo:  Not seen MSD: 5.7 mm  mm   5 w   2  d Subchorionic hemorrhage:  None visualized. Maternal uterus/adnexae: Normal. No free pelvic fluid. IMPRESSION: Asymmetrically positioned 5.7 mm cystic structure within the endometrium of the right horn of the uterine fundus, which may represent an early gestational sac. Probable early intrauterine gestational sac, but no yolk sac, fetal pole, or cardiac activity yet visualized. Recommend follow-up quantitative B-HCG levels and follow-up US in 14 days to assess viability. This recommendation follows SRU consensus guidelines: Diagnostic Criteria for Nonviable Pregnancy Early in the First Trimester. Malva Limes Med 2013; 782:9562-13. Electronically Signed   By: Ted Mcalpine M.D.   On: 08/01/2016 14:29   US Ob Transvaginal  Result Date: 08/01/2016 CLINICAL DATA:  Cramping since 11 a.m. Uncertain last menstrual period. EXAM: OBSTETRIC <14 WK [place] AND TRANSVAGINAL OB US TECHNIQUE: Both transabdominal and transvaginal ultrasound examinations were performed for complete evaluation of the gestation as well as the maternal uterus, adnexal regions, and pelvic cul-de-sac. Transvaginal technique was performed to assess early pregnancy. COMPARISON:  Pelvic ultrasound 01/19/2011 FINDINGS: Intrauterine gestational sac: Present Yolk sac:  Not seen Embryo:  Not seen MSD: 5.7 mm  mm   5 w   2  d Subchorionic hemorrhage:  None visualized. Maternal uterus/adnexae: Normal. No free pelvic fluid. IMPRESSION: Asymmetrically positioned 5.7 mm cystic structure within the endometrium of the right horn of the uterine fundus, which may represent an early gestational sac. Probable early intrauterine gestational sac,  but no yolk sac, fetal pole, or cardiac activity yet visualized. Recommend follow-up quantitative B-HCG levels and follow-up US in 14 days to assess viability. This recommendation follows SRU consensus guidelines: Diagnostic Criteria for Nonviable  Pregnancy Early in the First Trimester. Malva Limes Engl J Med 2013; 409:8119-14; 369:1443-51. Electronically Signed   By: Ted Mcalpineobrinka  Dimitrova M.D.   On: 08/01/2016 14:29    MAU course/MDM: Quantitative hCG ordered  Pain and bleeding in early pregnancy with normal rise in Quant and hemodynamically stable.  Assessment: Early pregnancy with appropriate rise in quant. No abdominal pain or bleeding today. Pregnancy of unknown anatomic location  Plan: Discharge home in stable condition. Ectopic and SAB precautions Follow-up Information    [facility] Follow up in 1 week(s).   Specialty:  Radiology Why:  Will call you to schedule a follow-up ultrasound. Please be prepared to stay for 2 hours for results. Contact information: [address] [id number] [place] [id number] [phone]       [facility] [place] [facility] Follow up.   Why:  As needed in emergencies Contact information: [address] [id number] [place] [id number] [phone]         Allergies as of 08/04/2016      Reactions   Penicillins Swelling, Other (See Comments)   Reaction:  Arm/leg swelling  Has patient had a PCN reaction causing immediate rash, facial/tongue/throat swelling, SOB or lightheadedness with hypotension: Yes Has patient had a PCN reaction causing severe rash involving mucus membranes or skin necrosis: No Has patient had a PCN reaction that required hospitalization: No Has patient had a PCN reaction occurring within the last 10 years: No If all of the above answers are "NO", then may proceed with Cephalosporin use.   Shrimp [shellfish Allergy] Swelling, Other (See Comments)   Reaction:  Localized swelling      Medication List    You have not been prescribed any medications.     Katrinka BlazingSmith, [place], CNM 08/04/2016, 5:59 PM  2/3

## 2016-08-04 NOTE — MAU Note (Signed)
Here for follow up Denies vaginal bleeding or pain at this time.   States her cold is getting worse from the last time she was seen on 08/01/16

## 2016-08-04 NOTE — Discharge Instructions (Signed)

## 2016-08-12 ENCOUNTER — Ambulatory Visit: Payer: Medicaid Other | Admitting: General Practice

## 2016-08-12 ENCOUNTER — Ambulatory Visit (HOSPITAL_COMMUNITY)
Admission: RE | Admit: 2016-08-12 | Discharge: 2016-08-12 | Disposition: A | Discharge status: Home / Self Care | Payer: Medicaid Other | Source: Ambulatory Visit | Attending: Advanced Practice Midwife | Admitting: Advanced Practice Midwife

## 2016-08-12 ENCOUNTER — Encounter: Payer: Self-pay | Admitting: General Practice

## 2016-08-12 DIAGNOSIS — O26899 Other specified pregnancy related conditions, unspecified trimester: Secondary | ICD-10-CM | POA: Diagnosis present

## 2016-08-12 DIAGNOSIS — R109 Unspecified abdominal pain: Secondary | ICD-10-CM | POA: Insufficient documentation

## 2016-08-12 DIAGNOSIS — Z3A01 Less than 8 weeks gestation of pregnancy: Secondary | ICD-10-CM | POA: Insufficient documentation

## 2016-08-12 DIAGNOSIS — O26891 Other specified pregnancy related conditions, first trimester: Secondary | ICD-10-CM | POA: Diagnosis not present

## 2016-08-12 DIAGNOSIS — Z712 Person consulting for explanation of examination or test findings: Secondary | ICD-10-CM

## 2016-08-12 DIAGNOSIS — O3680X0 Pregnancy with inconclusive fetal viability, not applicable or unspecified: Secondary | ICD-10-CM

## 2016-08-12 NOTE — Progress Notes (Signed)
Patient here for viability results. Per Dr Jolayne Pantheronstant, patient's ultrasound results are favorable & patient should begin prenatal care. Informed patient of results & provided picture. Patient denies current use of vitamins or meds. Instructed patient to begin prenatal vitamins and care. Patient verbalized understanding and states she is about [place] and wants to know what she should do to prevent Zika. Told patient to wear long sleeves and pants and use bug spray. Patient verbalized understanding & had no questions

## 2016-09-21 ENCOUNTER — Encounter: Payer: Medicaid Other | Admitting: Student

## 2016-09-30 ENCOUNTER — Encounter (HOSPITAL_COMMUNITY): Payer: Self-pay | Admitting: Emergency Medicine

## 2016-09-30 ENCOUNTER — Emergency Department (HOSPITAL_COMMUNITY)
Admission: EM | Admit: 2016-09-30 | Discharge: 2016-09-30 | Disposition: A | Discharge status: Home / Self Care | Payer: Medicaid Other | Attending: Emergency Medicine | Admitting: Emergency Medicine

## 2016-09-30 DIAGNOSIS — Z3A13 13 weeks gestation of pregnancy: Secondary | ICD-10-CM | POA: Diagnosis not present

## 2016-09-30 DIAGNOSIS — R1084 Generalized abdominal pain: Secondary | ICD-10-CM | POA: Insufficient documentation

## 2016-09-30 DIAGNOSIS — O26891 Other specified pregnancy related conditions, first trimester: Secondary | ICD-10-CM | POA: Insufficient documentation

## 2016-09-30 DIAGNOSIS — R112 Nausea with vomiting, unspecified: Secondary | ICD-10-CM | POA: Insufficient documentation

## 2016-09-30 DIAGNOSIS — R197 Diarrhea, unspecified: Secondary | ICD-10-CM

## 2016-09-30 LAB — CBC
HCT: 37.1 % (ref 36.0–46.0)
HEMOGLOBIN: 12.2 g/dL (ref 12.0–15.0)
MCH: 27.4 pg (ref 26.0–34.0)
MCHC: 32.9 g/dL (ref 30.0–36.0)
MCV: 83.4 fL (ref 78.0–100.0)
Platelets: 283 10*3/uL (ref 150–400)
RBC: 4.45 MIL/uL (ref 3.87–5.11)
RDW: 14.3 % (ref 11.5–15.5)
WBC: 11.9 10*3/uL — ABNORMAL HIGH (ref 4.0–10.5)

## 2016-09-30 LAB — COMPREHENSIVE METABOLIC PANEL
ALBUMIN: 3.5 g/dL (ref 3.5–5.0)
ALK PHOS: 58 U/L (ref 38–126)
ALT: 27 U/L (ref 14–54)
ANION GAP: 9 (ref 5–15)
AST: 34 U/L (ref 15–41)
BUN: 6 mg/dL (ref 6–20)
CALCIUM: 8.6 mg/dL — AB (ref 8.9–10.3)
CO2: 23 mmol/L (ref 22–32)
Chloride: 104 mmol/L (ref 101–111)
Creatinine, Ser: 0.48 mg/dL (ref 0.44–1.00)
GFR calc non Af Amer: >60 mL/min (ref >60)
GLUCOSE: 99 mg/dL (ref 65–99)
POTASSIUM: 3.9 mmol/L (ref 3.5–5.1)
SODIUM: 136 mmol/L (ref 135–145)
Total Bilirubin: 0.5 mg/dL (ref 0.3–1.2)
Total Protein: 7.1 g/dL (ref 6.5–8.1)

## 2016-09-30 LAB — URINALYSIS, ROUTINE W REFLEX MICROSCOPIC
Bilirubin Urine: NEGATIVE
GLUCOSE, UA: NEGATIVE mg/dL
Hgb urine dipstick: NEGATIVE
KETONES UR: 5 mg/dL — AB
NITRITE: NEGATIVE
PROTEIN: 30 mg/dL — AB
Specific Gravity, Urine: 1.021 (ref 1.005–1.030)
pH: 7 (ref 5.0–8.0)

## 2016-09-30 LAB — WET PREP, GENITAL
Clue Cells Wet Prep HPF POC: NONE SEEN
Sperm: NONE SEEN
Trich, Wet Prep: NONE SEEN
Yeast Wet Prep HPF POC: NONE SEEN

## 2016-09-30 LAB — GC/CHLAMYDIA PROBE AMP (~~LOC~~) NOT AT ARMC
CHLAMYDIA, DNA PROBE: NEGATIVE
NEISSERIA GONORRHEA: NEGATIVE

## 2016-09-30 LAB — HCG, QUANTITATIVE, PREGNANCY: HCG, BETA CHAIN, QUANT, S: 65211 m[IU]/mL — AB (ref <5)

## 2016-09-30 LAB — LIPASE, BLOOD: LIPASE: 25 U/L (ref 11–51)

## 2016-09-30 MED ORDER — ONDANSETRON 4 MG PO TBDP
4.0000 mg | ORAL_TABLET | Freq: Once | ORAL | Status: AC | PRN
  Administered 2016-09-30: 4 mg via ORAL

## 2016-09-30 MED ORDER — ONDANSETRON 4 MG PO TBDP
ORAL_TABLET | ORAL | Status: AC
  Administered 2016-09-30: 4 mg via ORAL
  Filled 2016-09-30: qty 1

## 2016-09-30 MED ORDER — SODIUM CHLORIDE 0.9 % IV BOLUS (SEPSIS)
1000.0000 mL | Freq: Once | INTRAVENOUS | Status: AC
  Administered 2016-09-30: 1000 mL via INTRAVENOUS

## 2016-09-30 NOTE — ED Provider Notes (Signed)
[facility]-EMERGENCY DEPT Provider Note   CSN: [id number] Arrival date & time: 09/30/16  0008     History   Chief Complaint Chief Complaint  Patient presents with  . Abdominal Pain    [redacted] weeks pregnant     HPI Kelsey Black is a 26 y.o. female.  Patient presents to the emergency department with chief complaint of generalized abdominal pain.  She is [redacted] weeks along. She reports associated nausea, vomiting, diarrhea. She states that her symptoms started yesterday afternoon and continued into the evening. She reports that her symptoms now resolved. She denies any fevers or chills. Denies any dysuria, vaginal discharge, or vaginal bleeding.  She has not taken anything for her symptoms.   The history is provided by the patient. No language interpreter was used.    Past Medical History:  Diagnosis Date  . Anemia     Patient Active Problem List   Diagnosis Date Noted  . Bacterial vaginosis 08/01/2014    Past Surgical History:  Procedure Laterality Date  . NO PAST SURGERIES      OB History    Gravida Para Term Preterm AB Living   1 0 0 0 0     SAB TAB Ectopic Multiple Live Births   0 0 0           Home Medications    Prior to Admission medications   Medication Sig Start Date End Date Taking? Authorizing Provider  prenatal vitamin w/FE, FA (NATACHEW) 29-1 MG CHEW chewable tablet Chew 1 tablet by mouth daily at 12 noon.   Yes [provider]    Family History Family History  Problem Relation Age of Onset  . Asthma Mother   . Asthma Sister   . Asthma Brother     Social History Social History  Substance Use Topics  . Smoking status: Never Smoker  . Smokeless tobacco: Never Used  . Alcohol use No     Comment: wine     Allergies   Penicillins and Shrimp [shellfish allergy]   Review of Systems Review of Systems  All other systems reviewed and are negative.    Physical Exam Updated Vital Signs BP 111/69 (BP Location: Left Arm)   Pulse  93   Temp 98.9 F (37.2 C) (Oral)   Resp 16   LMP 05/26/2016   SpO2 99%   Physical Exam  Constitutional: She is oriented to person, place, and time. She appears well-developed and well-nourished.  HENT:  Head: Normocephalic and atraumatic.  Eyes: Pupils are equal, round, and reactive to light. Conjunctivae and EOM are normal.  Neck: Normal range of motion. Neck supple.  Cardiovascular: Normal rate and regular rhythm.  Exam reveals no gallop and no friction rub.   No murmur heard. Pulmonary/Chest: Effort normal and breath sounds normal. No respiratory distress. She has no wheezes. She has no rales. She exhibits no tenderness.  Abdominal: Soft. Bowel sounds are normal. She exhibits no distension and no mass. There is no tenderness. There is no rebound and no guarding.  No focal abdominal tenderness, no RLQ tenderness or pain at McBurney's point, no RUQ tenderness or Murphy's sign, no left-sided abdominal tenderness, no fluid wave, or signs of peritonitis   Genitourinary:  Genitourinary Comments: Pelvic exam chaperoned by female ER tech, performed under my supervision by PA-S no right or left adnexal tenderness, no uterine tenderness, no vaginal discharge, no bleeding, no CMT or friability, no foreign body, no injury to the external genitalia, no other significant  findings   Musculoskeletal: Normal range of motion. She exhibits no edema or tenderness.  Neurological: She is alert and oriented to person, place, and time.  Skin: Skin is warm and dry.  Psychiatric: She has a normal mood and affect. Her behavior is normal. Judgment and thought content normal.  Nursing note and vitals reviewed.    ED Treatments / Results  Labs (all labs ordered are listed, but only abnormal results are displayed) Labs Reviewed  COMPREHENSIVE METABOLIC PANEL - Abnormal; Notable for the following:       Result Value   Calcium 8.6 (*)    All other components within normal limits  CBC - Abnormal; Notable for  the following:    WBC 11.9 (*)    All other components within normal limits  URINALYSIS, ROUTINE W REFLEX MICROSCOPIC - Abnormal; Notable for the following:    APPearance HAZY (*)    Ketones, ur 5 (*)    Protein, ur 30 (*)    Leukocytes, UA SMALL (*)    Bacteria, UA RARE (*)    Squamous Epithelial / LPF 6-30 (*)    All other components within normal limits  HCG, QUANTITATIVE, PREGNANCY - Abnormal; Notable for the following:    hCG, Beta Chain, Quant, S 65,211 (*)    All other components within normal limits  WET PREP, GENITAL  LIPASE, BLOOD    EKG  EKG Interpretation None       Radiology No results found.  Procedures Procedures (including critical care time)  Medications Ordered in ED Medications  ondansetron (ZOFRAN-ODT) disintegrating tablet 4 mg (4 mg Oral Given 09/30/16 0051)  sodium chloride 0.9 % bolus 1,000 mL (1,000 mLs Intravenous New Bag/Given 09/30/16 0319)     Initial Impression / Assessment and Plan / ED Course  I have reviewed the triage vital signs and the nursing notes.  Pertinent labs & imaging results that were available during my care of the patient were reviewed by me and considered in my medical decision making (see chart for details).     Pregnant patient with generalized abdominal pain, n/v/d.  Denies vaginal complaints or pelvic pain.  Will check pelvic exam for any bleeding.  Will give some fluid.   Pelvic exam unremarkable for any vaginal bleeding.  Os is closed.    4:15 AM Patient reassessed.  Still feels well.  Is comfortable with discharge to home.  OBGYN follow-up.  No pain now.  No adnexal pain. I suspect that her symptoms are viral given the associated episodes of nausea, vomiting, and diarrhea.  Final Clinical Impressions(s) / ED Diagnoses   Final diagnoses:  Generalized abdominal pain  Nausea vomiting and diarrhea    New Prescriptions New Prescriptions   No medications on file         Roxy Horseman, Cordelia Poche 09/30/16  1478    Zadie Rhine, MD 09/30/16 [phone]

## 2016-09-30 NOTE — ED Triage Notes (Signed)
Patient reports mid abdominal pain with emesis onset 10 pm this evening , denies diarrhea /no fever , she is [redacted] weeks pregnant G1P0 , denies vaginal bleeding .

## 2016-10-14 LAB — OB RESULTS CONSOLE HEPATITIS B SURFACE ANTIGEN: Hepatitis B Surface Ag: NEGATIVE

## 2016-10-14 LAB — OB RESULTS CONSOLE RUBELLA ANTIBODY, IGM: Rubella: IMMUNE

## 2016-11-06 ENCOUNTER — Inpatient Hospital Stay (HOSPITAL_COMMUNITY): Payer: Medicaid Other

## 2016-11-06 ENCOUNTER — Inpatient Hospital Stay (HOSPITAL_COMMUNITY)
Admission: AD | Admit: 2016-11-06 | Discharge: 2016-11-06 | Disposition: A | Discharge status: Home / Self Care | Payer: Medicaid Other | Source: Ambulatory Visit | Attending: Obstetrics & Gynecology | Admitting: Obstetrics & Gynecology

## 2016-11-06 ENCOUNTER — Encounter (HOSPITAL_COMMUNITY): Payer: Self-pay

## 2016-11-06 DIAGNOSIS — R1030 Lower abdominal pain, unspecified: Secondary | ICD-10-CM | POA: Diagnosis present

## 2016-11-06 DIAGNOSIS — K8020 Calculus of gallbladder without cholecystitis without obstruction: Secondary | ICD-10-CM | POA: Insufficient documentation

## 2016-11-06 DIAGNOSIS — O26612 Liver and biliary tract disorders in pregnancy, second trimester: Secondary | ICD-10-CM

## 2016-11-06 DIAGNOSIS — Z880 Allergy status to penicillin: Secondary | ICD-10-CM | POA: Insufficient documentation

## 2016-11-06 DIAGNOSIS — R1011 Right upper quadrant pain: Secondary | ICD-10-CM

## 2016-11-06 DIAGNOSIS — O99612 Diseases of the digestive system complicating pregnancy, second trimester: Secondary | ICD-10-CM | POA: Diagnosis not present

## 2016-11-06 DIAGNOSIS — Z3A18 18 weeks gestation of pregnancy: Secondary | ICD-10-CM | POA: Insufficient documentation

## 2016-11-06 LAB — COMPREHENSIVE METABOLIC PANEL
ALBUMIN: 3.1 g/dL — AB (ref 3.5–5.0)
ALT: 36 U/L (ref 14–54)
ANION GAP: 6 (ref 5–15)
AST: 42 U/L — ABNORMAL HIGH (ref 15–41)
Alkaline Phosphatase: 77 U/L (ref 38–126)
BILIRUBIN TOTAL: 0.8 mg/dL (ref 0.3–1.2)
BUN: <5 mg/dL — ABNORMAL LOW (ref 6–20)
CALCIUM: 8.5 mg/dL — AB (ref 8.9–10.3)
CO2: 25 mmol/L (ref 22–32)
Chloride: 104 mmol/L (ref 101–111)
Creatinine, Ser: 0.48 mg/dL (ref 0.44–1.00)
Glucose, Bld: 101 mg/dL — ABNORMAL HIGH (ref 65–99)
POTASSIUM: 3.9 mmol/L (ref 3.5–5.1)
Sodium: 135 mmol/L (ref 135–145)
TOTAL PROTEIN: 6.8 g/dL (ref 6.5–8.1)

## 2016-11-06 LAB — CBC
HEMATOCRIT: 34.8 % — AB (ref 36.0–46.0)
Hemoglobin: 11.4 g/dL — ABNORMAL LOW (ref 12.0–15.0)
MCH: 27.9 pg (ref 26.0–34.0)
MCHC: 32.8 g/dL (ref 30.0–36.0)
MCV: 85.1 fL (ref 78.0–100.0)
Platelets: 255 10*3/uL (ref 150–400)
RBC: 4.09 MIL/uL (ref 3.87–5.11)
RDW: 14.8 % (ref 11.5–15.5)
WBC: 9.2 10*3/uL (ref 4.0–10.5)

## 2016-11-06 LAB — URINALYSIS, ROUTINE W REFLEX MICROSCOPIC
Bilirubin Urine: NEGATIVE
GLUCOSE, UA: NEGATIVE mg/dL
Ketones, ur: NEGATIVE mg/dL
Nitrite: NEGATIVE
PH: 7 (ref 5.0–8.0)
PROTEIN: NEGATIVE mg/dL
SPECIFIC GRAVITY, URINE: 1.008 (ref 1.005–1.030)

## 2016-11-06 LAB — WET PREP, GENITAL
Clue Cells Wet Prep HPF POC: NONE SEEN
Sperm: NONE SEEN
Trich, Wet Prep: NONE SEEN

## 2016-11-06 LAB — LIPASE, BLOOD: LIPASE: 23 U/L (ref 11–51)

## 2016-11-06 MED ORDER — LACTATED RINGERS IV BOLUS (SEPSIS)
1000.0000 mL | Freq: Once | INTRAVENOUS | Status: AC
  Administered 2016-11-06: 1000 mL via INTRAVENOUS

## 2016-11-06 MED ORDER — FAMOTIDINE 20 MG PO TABS: 20.0000 mg | ORAL_TABLET | Freq: Two times a day (BID) | ORAL | 5 refills | Status: DC

## 2016-11-06 MED ORDER — FAMOTIDINE IN NACL 20-0.9 MG/50ML-% IV SOLN
20.0000 mg | Freq: Once | INTRAVENOUS | Status: AC
  Administered 2016-11-06: 20 mg via INTRAVENOUS
  Filled 2016-11-06: qty 50

## 2016-11-06 MED ORDER — MORPHINE SULFATE (PF) 4 MG/ML IV SOLN
4.0000 mg | Freq: Once | INTRAVENOUS | Status: AC
  Administered 2016-11-06: 4 mg via INTRAVENOUS
  Filled 2016-11-06: qty 1

## 2016-11-06 MED ORDER — OXYCODONE-ACETAMINOPHEN 5-325 MG PO TABS: 1.0000 | ORAL_TABLET | Freq: Four times a day (QID) | ORAL | 0 refills | Status: DC | PRN

## 2016-11-06 MED ORDER — PROMETHAZINE HCL 25 MG/ML IJ SOLN
25.0000 mg | Freq: Four times a day (QID) | INTRAMUSCULAR | Status: DC | PRN
  Administered 2016-11-06: 25 mg via INTRAVENOUS
  Filled 2016-11-06: qty 1

## 2016-11-06 MED ORDER — PROMETHAZINE HCL 25 MG PO TABS: 12.5000 mg | ORAL_TABLET | Freq: Four times a day (QID) | ORAL | 2 refills | Status: DC | PRN

## 2016-11-06 NOTE — MAU Provider Note (Signed)
Chief Complaint: Abdominal Pain; Back Pain; and Vaginal Discharge   First Provider Initiated Contact with Patient 11/06/16 1351      SUBJECTIVE HPI: Kelsey Black is a 26 y.o. G1P0000 at [redacted]w[redacted]d by LMP who presents to maternity admissions reporting sudden onset of severe upper and lower abdominal pain at 10:15 am today. She reports the pain is constant squeezing pain. She has had this pain 1-2 times earlier in the pregnancy.  She ate pizza with thousand [place] dressing 2 hours prior to onset of the pain. She last ate at 9 am today and has had only sips of water since then.  The pain is associated with nausea and vomiting x 1.  She denies other associated symptoms. She has not tried any medications or other treatments. She reports some thick white vaginal discharge for a few days but denies any vaginal itching/burning.  She denies cramping abdominal pain, vaginal bleeding, urinary symptoms, h/a, dizziness, or fever/chills.     HPI  Past Medical History:  Diagnosis Date  . Anemia    Past Surgical History:  Procedure Laterality Date  . NO PAST SURGERIES     Social History   Social History  . Marital status: Single    Spouse name: N/A  . Number of children: N/A  . Years of education: N/A   Occupational History  . Not on file.   Social History Main Topics  . Smoking status: Never Smoker  . Smokeless tobacco: Never Used  . Alcohol use No     Comment: wine  . Drug use: No  . Sexual activity: Yes   Other Topics Concern  . Not on file   Social History Narrative   ** Merged History Encounter **       No current facility-administered medications on file prior to encounter.    Current Outpatient Prescriptions on File Prior to Encounter  Medication Sig Dispense Refill  . prenatal vitamin w/FE, FA (NATACHEW) 29-1 MG CHEW chewable tablet Chew 1 tablet by mouth daily at 12 noon.     Allergies  Allergen Reactions  . Penicillins Swelling and Other (See Comments)   Reaction:  Arm/leg swelling  Has patient had a PCN reaction causing immediate rash, facial/tongue/throat swelling, SOB or lightheadedness with hypotension: Yes Has patient had a PCN reaction causing severe rash involving mucus membranes or skin necrosis: No Has patient had a PCN reaction that required hospitalization: No Has patient had a PCN reaction occurring within the last 10 years: No If all of the above answers are "NO", then may proceed with Cephalosporin use.  . Shrimp [Shellfish Allergy] Swelling and Other (See Comments)    Reaction:  Localized swelling    ROS:  Review of Systems  Constitutional: Negative for chills, fatigue and fever.  Eyes: Negative for visual disturbance.  Respiratory: Negative for shortness of breath.   Cardiovascular: Negative for chest pain.  Gastrointestinal: Positive for abdominal pain, nausea and vomiting.  Genitourinary: Negative for difficulty urinating, dysuria, flank pain, pelvic pain, vaginal bleeding, vaginal discharge and vaginal pain.  Neurological: Negative for dizziness and headaches.  Psychiatric/Behavioral: Negative.      I have reviewed patient's Past Medical Hx, Surgical Hx, Family Hx, Social Hx, medications and allergies.   Physical Exam   Patient Vitals for the past 24 hrs:  BP Temp Temp src Pulse Resp SpO2 Weight  11/06/16 1158 (!) 125/56 98 F (36.7 C) Oral 82 18 100 % 185 lb (83.9 kg)   Constitutional: Well-developed, well-nourished female in  no acute distress.  Cardiovascular: normal rate Respiratory: normal effort GI: Abd soft, non-tender. Pos BS x 4 MS: Extremities nontender, no edema, normal ROM Neurologic: Alert and oriented x 4.  GU: Neg CVAT.  Pelvic exam: Cervix pink, visually closed, without lesion,large amount thick white vaginal discharge, vaginal walls and external genitalia normal  Dilation: Closed Effacement (%): Thick Exam by:: Sharen Counter CNM  FHT 155 by doppler  LAB RESULTS Results for  orders placed or performed during the hospital encounter of 11/06/16 (from the past 24 hour(s))  Urinalysis, Routine w reflex microscopic     Status: Abnormal   Collection Time: 11/06/16 12:01 PM  Result Value Ref Range   Color, Urine YELLOW YELLOW   APPearance CLOUDY (A) CLEAR   Specific Gravity, Urine 1.008 1.005 - 1.030   pH 7.0 5.0 - 8.0   Glucose, UA NEGATIVE NEGATIVE mg/dL   Hgb urine dipstick SMALL (A) NEGATIVE   Bilirubin Urine NEGATIVE NEGATIVE   Ketones, ur NEGATIVE NEGATIVE mg/dL   Protein, ur NEGATIVE NEGATIVE mg/dL   Nitrite NEGATIVE NEGATIVE   Leukocytes, UA LARGE (A) NEGATIVE   RBC / HPF 6-30 0 - 5 RBC/hpf   WBC, UA 6-30 0 - 5 WBC/hpf   Bacteria, UA RARE (A) NONE SEEN   Squamous Epithelial / LPF 6-30 (A) NONE SEEN  CBC     Status: Abnormal   Collection Time: 11/06/16  1:21 PM  Result Value Ref Range   WBC 9.2 4.0 - 10.5 K/uL   RBC 4.09 3.87 - 5.11 MIL/uL   Hemoglobin 11.4 (L) 12.0 - 15.0 g/dL   HCT 16.1 (L) 09.6 - 04.5 %   MCV 85.1 78.0 - 100.0 fL   MCH 27.9 26.0 - 34.0 pg   MCHC 32.8 30.0 - 36.0 g/dL   RDW 40.9 81.1 - 91.4 %   Platelets 255 150 - 400 K/uL  Comprehensive metabolic panel     Status: Abnormal   Collection Time: 11/06/16  1:21 PM  Result Value Ref Range   Sodium 135 135 - 145 mmol/L   Potassium 3.9 3.5 - 5.1 mmol/L   Chloride 104 101 - 111 mmol/L   CO2 25 22 - 32 mmol/L   Glucose, Bld 101 (H) 65 - 99 mg/dL   BUN <5 (L) 6 - 20 mg/dL   Creatinine, Ser 7.82 0.44 - 1.00 mg/dL   Calcium 8.5 (L) 8.9 - 10.3 mg/dL   Total Protein 6.8 6.5 - 8.1 g/dL   Albumin 3.1 (L) 3.5 - 5.0 g/dL   AST 42 (H) 15 - 41 U/L   ALT 36 14 - 54 U/L   Alkaline Phosphatase 77 38 - 126 U/L   Total Bilirubin 0.8 0.3 - 1.2 mg/dL   GFR calc non Af Amer >60 >60 mL/min   GFR calc Af Amer >60 >60 mL/min   Anion gap 6 5 - 15  Lipase, blood     Status: None   Collection Time: 11/06/16  1:21 PM  Result Value Ref Range   Lipase 23 11 - 51 U/L  Wet prep, genital     Status:  Abnormal   Collection Time: 11/06/16  1:45 PM  Result Value Ref Range   Yeast Wet Prep HPF POC PRESENT (A) NONE SEEN   Trich, Wet Prep NONE SEEN NONE SEEN   Clue Cells Wet Prep HPF POC NONE SEEN NONE SEEN   WBC, Wet Prep HPF POC MANY (A) NONE SEEN   Sperm NONE SEEN     --/--/  O POS (06/16 1258)  IMAGING US Abdomen Limited Ruq  Result Date: 11/06/2016 CLINICAL DATA:  Abdominal pain with nausea. EXAM: ULTRASOUND ABDOMEN LIMITED RIGHT UPPER QUADRANT COMPARISON:  None. FINDINGS: Gallbladder: Within the gallbladder, there are echogenic foci which move and shadow consistent with cholelithiasis. Largest individual gallstone measures 5 mm in length. There is no gallbladder wall thickening or pericholecystic fluid. No sonographic Murphy sign noted by sonographer. Common bile duct: Diameter: 9 mm, dilated. No mass or calculus is evident in the biliary ductal system. It should be noted that portions of the distal common bile duct are obscured by gas. Liver: No focal lesion identified. No intrahepatic biliary duct dilatation evident. Within normal limits in parenchymal echogenicity. Portal vein is patent on color Doppler imaging with normal direction of blood flow towards the liver. IMPRESSION: Cholelithiasis. No gallbladder wall thickening or pericholecystic fluid. Dilatation of the proximal common bile duct. No mass or calculus is evident in the visualized common bile duct. It should be noted, however, that portions of the distal common bile duct are obscured by gas. From an imaging standpoint, MRCP may be helpful to assess for potential lesion more distally in the common bile duct is a potential cause for the dilatation of the common bile duct in regions which can be visualized. Electronically Signed   By: Bretta Bang III M.D.   On: 11/06/2016 16:03    MAU Management/MDM: Ordered labs and [place] and reviewed results.  Cholelithiasis without acute process or blockage noted.  LR x 1000 ml, Pepcid 20 mg IV,  and Phenergan 25 mg IV given. Pt reports reduced symptoms. Consult Dr Henderson Cloud with assessment and findings.  D/C pt and pt to call office Monday for referral to general surgery. Strict low fat diet, with printed materials given today. Pt still reporting moderate pain prior to discharge so morphine 4 mg IV given x 1 and Rx for Pepcid, Phenergan, and Percocet 5/325, for pt to take 1-2 tabs Q 6 hours PRN x 6 tabs only.  Pt stable at time of discharge.  ASSESSMENT 1. Cholelithiasis affecting pregnancy in second trimester, antepartum   2. Colicky RUQ abdominal pain     PLAN Discharge home Allergies as of 11/06/2016      Reactions   Penicillins Swelling, Other (See Comments)   Reaction:  Arm/leg swelling  Has patient had a PCN reaction causing immediate rash, facial/tongue/throat swelling, SOB or lightheadedness with hypotension: Yes Has patient had a PCN reaction causing severe rash involving mucus membranes or skin necrosis: No Has patient had a PCN reaction that required hospitalization: No Has patient had a PCN reaction occurring within the last 10 years: No If all of the above answers are "NO", then may proceed with Cephalosporin use.   Shrimp [shellfish Allergy] Swelling, Other (See Comments)   Reaction:  Localized swelling      Medication List    TAKE these medications   famotidine 20 MG tablet Commonly known as:  PEPCID Take 1 tablet (20 mg total) by mouth 2 (two) times daily.   oxyCODONE-acetaminophen 5-325 MG tablet Commonly known as:  PERCOCET/ROXICET Take 1-2 tablets by mouth every 6 (six) hours as needed for severe pain.   prenatal vitamin w/FE, FA 29-1 MG Chew chewable tablet Chew 1 tablet by mouth daily at 12 noon.   promethazine 25 MG tablet Commonly known as:  PHENERGAN Take 0.5-1 tablets (12.5-25 mg total) by mouth every 6 (six) hours as needed.  Discharge Care Instructions        Start     Ordered   11/06/16 0000  famotidine (PEPCID) 20 MG  tablet  2 times daily    Question:  Supervising Provider  Answer:  Adam Phenix   11/06/16 1719   11/06/16 0000  oxyCODONE-acetaminophen (PERCOCET/ROXICET) 5-325 MG tablet  Every 6 hours PRN    Question:  Supervising Provider  Answer:  Adam Phenix   11/06/16 1719   11/06/16 0000  promethazine (PHENERGAN) 25 MG tablet  Every 6 hours PRN    Question:  Supervising Provider  Answer:  Adam Phenix   11/06/16 1719   11/06/16 0000  Discharge patient    Question Answer Comment  Discharge disposition 01-Home or Self Care   Discharge patient date 11/06/2016      11/06/16 1719     Follow-up Information    Carrington Clamp, MD Follow up.   Specialty:  Obstetrics and Gynecology Why:  Call the office Monday for referral for gallstones.  Return to MAU as needed for emergencies. Contact information: [address] [place] [address] [phone]           Sharen Counter Certified Nurse-Midwife 11/06/2016  5:22 PM

## 2016-11-06 NOTE — MAU Note (Signed)
+  abdominal and back pain  Started at 1015 Constant pain Tight and sharp  +nausea  +vaginal discharge White and thick

## 2016-11-07 ENCOUNTER — Encounter (HOSPITAL_COMMUNITY): Payer: Self-pay | Admitting: *Deleted

## 2016-11-07 ENCOUNTER — Inpatient Hospital Stay (HOSPITAL_COMMUNITY)
Admission: AD | Admit: 2016-11-07 | Discharge: 2016-11-07 | Disposition: A | Discharge status: Home / Self Care | Payer: Medicaid Other | Source: Ambulatory Visit | Attending: Obstetrics and Gynecology | Admitting: Obstetrics and Gynecology

## 2016-11-07 DIAGNOSIS — O9989 Other specified diseases and conditions complicating pregnancy, childbirth and the puerperium: Secondary | ICD-10-CM | POA: Diagnosis not present

## 2016-11-07 DIAGNOSIS — K8050 Calculus of bile duct without cholangitis or cholecystitis without obstruction: Secondary | ICD-10-CM | POA: Insufficient documentation

## 2016-11-07 DIAGNOSIS — O26612 Liver and biliary tract disorders in pregnancy, second trimester: Secondary | ICD-10-CM | POA: Insufficient documentation

## 2016-11-07 DIAGNOSIS — Z880 Allergy status to penicillin: Secondary | ICD-10-CM | POA: Diagnosis not present

## 2016-11-07 DIAGNOSIS — R1011 Right upper quadrant pain: Secondary | ICD-10-CM | POA: Diagnosis present

## 2016-11-07 DIAGNOSIS — Z3A18 18 weeks gestation of pregnancy: Secondary | ICD-10-CM | POA: Insufficient documentation

## 2016-11-07 LAB — URINALYSIS, ROUTINE W REFLEX MICROSCOPIC
BILIRUBIN URINE: NEGATIVE
Glucose, UA: NEGATIVE mg/dL
KETONES UR: NEGATIVE mg/dL
NITRITE: NEGATIVE
PH: 7 (ref 5.0–8.0)
Protein, ur: NEGATIVE mg/dL
SPECIFIC GRAVITY, URINE: 1.011 (ref 1.005–1.030)

## 2016-11-07 LAB — CBC WITH DIFFERENTIAL/PLATELET
Basophils Absolute: 0 10*3/uL (ref 0.0–0.1)
Basophils Relative: 0 %
EOS PCT: 1 %
Eosinophils Absolute: 0.1 10*3/uL (ref 0.0–0.7)
HCT: 35.2 % — ABNORMAL LOW (ref 36.0–46.0)
Hemoglobin: 11.4 g/dL — ABNORMAL LOW (ref 12.0–15.0)
LYMPHS PCT: 16 %
Lymphs Abs: 1.6 10*3/uL (ref 0.7–4.0)
MCH: 27.5 pg (ref 26.0–34.0)
MCHC: 32.4 g/dL (ref 30.0–36.0)
MCV: 84.8 fL (ref 78.0–100.0)
MONO ABS: 0.2 10*3/uL (ref 0.1–1.0)
MONOS PCT: 2 %
NEUTROS ABS: 7.9 10*3/uL — AB (ref 1.7–7.7)
Neutrophils Relative %: 81 %
Platelets: 277 10*3/uL (ref 150–400)
RBC: 4.15 MIL/uL (ref 3.87–5.11)
RDW: 14.7 % (ref 11.5–15.5)
WBC: 9.7 10*3/uL (ref 4.0–10.5)

## 2016-11-07 LAB — COMPREHENSIVE METABOLIC PANEL
ALBUMIN: 3.1 g/dL — AB (ref 3.5–5.0)
ALT: 72 U/L — ABNORMAL HIGH (ref 14–54)
ANION GAP: 5 (ref 5–15)
AST: 66 U/L — ABNORMAL HIGH (ref 15–41)
Alkaline Phosphatase: 104 U/L (ref 38–126)
BILIRUBIN TOTAL: 1 mg/dL (ref 0.3–1.2)
BUN: 6 mg/dL (ref 6–20)
CO2: 25 mmol/L (ref 22–32)
Calcium: 8.6 mg/dL — ABNORMAL LOW (ref 8.9–10.3)
Chloride: 104 mmol/L (ref 101–111)
Creatinine, Ser: 0.44 mg/dL (ref 0.44–1.00)
GFR calc non Af Amer: >60 mL/min (ref >60)
GLUCOSE: 89 mg/dL (ref 65–99)
POTASSIUM: 4.2 mmol/L (ref 3.5–5.1)
SODIUM: 134 mmol/L — AB (ref 135–145)
TOTAL PROTEIN: 6.5 g/dL (ref 6.5–8.1)

## 2016-11-07 MED ORDER — MORPHINE SULFATE (PF) 4 MG/ML IV SOLN
4.0000 mg | Freq: Once | INTRAVENOUS | Status: AC
  Administered 2016-11-07: 4 mg via INTRAVENOUS
  Filled 2016-11-07: qty 1

## 2016-11-07 MED ORDER — ONDANSETRON 4 MG PO TBDP: 4.0000 mg | ORAL_TABLET | Freq: Four times a day (QID) | ORAL | 0 refills | Status: DC | PRN

## 2016-11-07 MED ORDER — PROMETHAZINE HCL 25 MG/ML IJ SOLN
25.0000 mg | INTRAVENOUS | Status: AC
  Administered 2016-11-07: 25 mg via INTRAVENOUS
  Filled 2016-11-07: qty 1

## 2016-11-07 NOTE — MAU Provider Note (Signed)
History     CSN: [id number]  Arrival date and time: 11/07/16 1410   First Provider Initiated Contact with Patient 11/07/16 1517      Chief Complaint  Patient presents with  . Abdominal Pain   Kelsey Black is a 26 y.o. G1P0 at [redacted]w[redacted]d returning to MAU with severe colicky right upper quadrant and epigastric abdominal pain. She was seen here yesterday with initial episode of similar biliary colic symptoms. She had an abdominal ultrasound showing gallstones with dilation of CBD with no mass or calculus seen. Pain improved after she received IV fluids and 1 dose of morphine pre-discharge and she was able to sleep, but since awakening today she has gotten progressively worse. Pain radiates to mid back. At 12:30 today she took 1 Percocet without relief. She has been nauseated all day; drank a fruit drink, but did not attempt any solid foods today Still nauseated after she vomited after arriving here.       Past Medical History:  Diagnosis Date  . Anemia     Past Surgical History:  Procedure Laterality Date  . NO PAST SURGERIES      Family History  Problem Relation Age of Onset  . Asthma Mother   . Asthma Sister   . Asthma Brother     Social History  Substance Use Topics  . Smoking status: Never Smoker  . Smokeless tobacco: Never Used  . Alcohol use No     Comment: wine    Allergies:  Allergies  Allergen Reactions  . Penicillins Swelling and Other (See Comments)    Reaction:  Arm/leg swelling  Has patient had a PCN reaction causing immediate rash, facial/tongue/throat swelling, SOB or lightheadedness with hypotension: Yes Has patient had a PCN reaction causing severe rash involving mucus membranes or skin necrosis: No Has patient had a PCN reaction that required hospitalization: No Has patient had a PCN reaction occurring within the last 10 years: No If all of the above answers are "NO", then may proceed with Cephalosporin use.  . Shrimp [Shellfish Allergy]  Swelling and Other (See Comments)    Reaction:  Localized swelling    Prescriptions Prior to Admission  Medication Sig Dispense Refill Last Dose  . famotidine (PEPCID) 20 MG tablet Take 1 tablet (20 mg total) by mouth 2 (two) times daily. 60 tablet 5 11/06/2016 at Unknown time  . oxyCODONE-acetaminophen (PERCOCET/ROXICET) 5-325 MG tablet Take 1-2 tablets by mouth every 6 (six) hours as needed for severe pain. 6 tablet 0 11/07/2016 at Unknown time  . prenatal vitamin w/FE, FA (NATACHEW) 29-1 MG CHEW chewable tablet Chew 1 tablet by mouth daily at 12 noon.   11/07/2016 at Unknown time  . promethazine (PHENERGAN) 25 MG tablet Take 0.5-1 tablets (12.5-25 mg total) by mouth every 6 (six) hours as needed. (Patient taking differently: Take 12.5-25 mg by mouth every 6 (six) hours as needed for nausea or vomiting. ) 30 tablet 2 11/06/2016 at Unknown time    Review of Systems  Constitutional: Positive for appetite change. Negative for chills and fever.  HENT: Negative for congestion.   Respiratory: Negative for shortness of breath.   Gastrointestinal: Positive for abdominal pain, nausea and vomiting. Negative for diarrhea.  Genitourinary: Negative for dysuria, frequency, hematuria, urgency, vaginal bleeding and vaginal discharge.  Musculoskeletal: Negative for back pain.  Neurological: Negative for headaches.  Psychiatric/Behavioral: Positive for agitation. The patient is nervous/anxious.    Physical Exam   Blood pressure (!) 114/54, pulse 74, temperature 98.6 F (  37 C), temperature source Oral, resp. rate 16, height  (1.626 m), weight 182 lb (82.6 kg), last menstrual period 05/26/2016, SpO2 100 %.  DT FHR 148  Physical Exam  Nursing note and vitals reviewed. Constitutional: She is oriented to person, place, and time. She appears well-developed and well-nourished. She appears distressed.  Doubled over splinting abdomen and grimacing in apparent pain.  Eyes: No scleral icterus.  Neck: Neck  supple.  Cardiovascular: Normal rate.   Respiratory: Effort normal.  GI: Soft. Bowel sounds are normal. There is tenderness. There is guarding. There is no rebound.  Voluntary guarding S=D Murphy's positive; tender at right costal margin and epigastrium  Musculoskeletal: Normal range of motion.  Neg CVAT  Neurological: She is alert and oriented to person, place, and time.  Skin: Skin is warm and dry.  Psychiatric: She has a normal mood and affect. Her behavior is normal.    MAU Course  Procedures Results for orders placed or performed during the hospital encounter of 11/07/16 (from the past 24 hour(s))  CBC with Differential     Status: Abnormal   Collection Time: 11/07/16  3:54 PM  Result Value Ref Range   WBC 9.7 4.0 - 10.5 K/uL   RBC 4.15 3.87 - 5.11 MIL/uL   Hemoglobin 11.4 (L) 12.0 - 15.0 g/dL   HCT 13.2 (L) 44.0 - 10.2 %   MCV 84.8 78.0 - 100.0 fL   MCH 27.5 26.0 - 34.0 pg   MCHC 32.4 30.0 - 36.0 g/dL   RDW 72.5 36.6 - 44.0 %   Platelets 277 150 - 400 K/uL   Neutrophils Relative % 81 %   Neutro Abs 7.9 (H) 1.7 - 7.7 K/uL   Lymphocytes Relative 16 %   Lymphs Abs 1.6 0.7 - 4.0 K/uL   Monocytes Relative 2 %   Monocytes Absolute 0.2 0.1 - 1.0 K/uL   Eosinophils Relative 1 %   Eosinophils Absolute 0.1 0.0 - 0.7 K/uL   Basophils Relative 0 %   Basophils Absolute 0.0 0.0 - 0.1 K/uL  Comprehensive metabolic panel     Status: Abnormal   Collection Time: 11/07/16  3:54 PM  Result Value Ref Range   Sodium 134 (L) 135 - 145 mmol/L   Potassium 4.2 3.5 - 5.1 mmol/L   Chloride 104 101 - 111 mmol/L   CO2 25 22 - 32 mmol/L   Glucose, Bld 89 65 - 99 mg/dL   BUN 6 6 - 20 mg/dL   Creatinine, Ser 3.47 0.44 - 1.00 mg/dL   Calcium 8.6 (L) 8.9 - 10.3 mg/dL   Total Protein 6.5 6.5 - 8.1 g/dL   Albumin 3.1 (L) 3.5 - 5.0 g/dL   AST 66 (H) 15 - 41 U/L   ALT 72 (H) 14 - 54 U/L   Alkaline Phosphatase 104 38 - 126 U/L   Total Bilirubin 1.0 0.3 - 1.2 mg/dL   GFR calc non Af Amer >60 >60  mL/min   GFR calc Af Amer >60 >60 mL/min   Anion gap 5 5 - 15  Urinalysis, Routine w reflex microscopic     Status: Abnormal   Collection Time: 11/07/16  4:20 PM  Result Value Ref Range   Color, Urine YELLOW YELLOW   APPearance CLEAR CLEAR   Specific Gravity, Urine 1.011 1.005 - 1.030   pH 7.0 5.0 - 8.0   Glucose, UA NEGATIVE NEGATIVE mg/dL   Hgb urine dipstick SMALL (A) NEGATIVE   Bilirubin Urine NEGATIVE NEGATIVE  Ketones, ur NEGATIVE NEGATIVE mg/dL   Protein, ur NEGATIVE NEGATIVE mg/dL   Nitrite NEGATIVE NEGATIVE   Leukocytes, UA LARGE (A) NEGATIVE   RBC / HPF 0-5 0 - 5 RBC/hpf   WBC, UA 0-5 0 - 5 WBC/hpf   Bacteria, UA RARE (A) NONE SEEN   Squamous Epithelial / LPF 0-5 (A) NONE SEEN   Mucus PRESENT   Urine specimen after IVF infusing Urine culture sent  IV LR 1000 with Phenergan  given> nausea resolved, retaining po fluids Morphine 4 mg IVP> good relief of pain. Abdomen: minimally tender RUQ  MDM C/W Dr. Henderson Cloud arrange surgical consult  Consulted Dr. Ezzard Standing, General Surgery> He will send note to office to have her seen there early next week. Also gave patient the office number to call Monday morning. If symptoms recur again before office visit, she should go directly to [facility] or [facility] for surgery consult. Patient is agreeable. Diet reviewed. Reviewed Percocet doses and Rx Zofran to use if unable to retain Phenergan po. Discharged home in stable condition.  Assessment and Plan  26 yo G1 at [redacted]w[redacted]d 1. Recurrent biliary colic    Allergies as of 11/07/2016      Reactions   Penicillins Swelling, Other (See Comments)   Reaction:  Arm/leg swelling  Has patient had a PCN reaction causing immediate rash, facial/tongue/throat swelling, SOB or lightheadedness with hypotension: Yes Has patient had a PCN reaction causing severe rash involving mucus membranes or skin necrosis: No Has patient had a PCN reaction that required hospitalization: No Has patient had a PCN reaction  occurring within the last 10 years: No If all of the above answers are "NO", then may proceed with Cephalosporin use.   Shrimp [shellfish Allergy] Swelling, Other (See Comments)   Reaction:  Localized swelling      Medication List    TAKE these medications   famotidine 20 MG tablet Commonly known as:  PEPCID Take 1 tablet (20 mg total) by mouth 2 (two) times daily.   ondansetron 4 MG disintegrating tablet Commonly known as:  ZOFRAN ODT Take 1 tablet (4 mg total) by mouth every 6 (six) hours as needed for nausea.   oxyCODONE-acetaminophen 5-325 MG tablet Commonly known as:  PERCOCET/ROXICET Take 1-2 tablets by mouth every 6 (six) hours as needed for severe pain.   prenatal vitamin w/FE, FA 29-1 MG Chew chewable tablet Chew 1 tablet by mouth daily at 12 noon.   promethazine 25 MG tablet Commonly known as:  PHENERGAN Take 0.5-1 tablets (12.5-25 mg total) by mouth every 6 (six) hours as needed. What changed:  reasons to take this            Discharge Care Instructions        Start     Ordered   11/07/16 0000  ondansetron (ZOFRAN ODT) 4 MG disintegrating tablet  Every 6 hours PRN    Question:  Supervising Provider  Answer:  Tereso Newcomer   11/07/16 1803     Follow-up Information    Ovidio Kin, MD. Schedule an appointment as soon as possible for a visit in 2 day(s).   Specialty:  General Surgery Why:  Call Monday morning if you have not yet heard from his office.   Contact information: [address] [place] [address] [phone]          Dresden Lozito CNM 11/07/2016, 4:16 PM

## 2016-11-07 NOTE — MAU Note (Signed)
Pt states she was seen yesterday for upper abd pain and was told it was gallbladder stones and was given pain meds but they are not helping.

## 2016-11-07 NOTE — Discharge Instructions (Signed)
Cholecystitis °Cholecystitis is inflammation of the gallbladder. It is often called a gallbladder attack. The gallbladder is a pear-shaped organ that lies beneath the liver on the right side of the body. The gallbladder stores bile, which is a fluid that helps the body to digest fats. If bile builds up in your gallbladder, your gallbladder becomes inflamed. This condition may occur suddenly (be acute). Repeat episodes of acute cholecystitis or prolonged episodes may lead to a long-term (chronic) condition. Cholecystitis is serious and it requires treatment. °What are the causes? °The most common cause of this condition is gallstones. Gallstones can block the tube (duct) that carries bile out of your gallbladder. This causes bile to build up. Other causes of this condition include: °· Damage to the gallbladder due to a decrease in blood flow. °· Infections in the bile ducts. °· Scars or kinks in the bile ducts. °· Tumors in the liver, pancreas, or gallbladder. °What increases the risk? °This condition is more likely to develop in: °· People who have sickle cell disease. °· People who take birth control pills or use estrogen. °· People who have alcoholic liver disease. °· People who have liver cirrhosis. °· People who have their nutrition delivered through a vein (parenteral nutrition). °· People who do not eat or drink (do fasting) for a long period of time. °· People who are obese. °· People who have rapid weight loss. °· People who are pregnant. °· People who have increased triglyceride levels. °· People who have pancreatitis. °What are the signs or symptoms? °Symptoms of this condition include: °· Abdominal pain, especially in the upper right area of the abdomen. °· Abdominal tenderness or bloating. °· Nausea. °· Vomiting. °· Fever. °· Chills. °· Yellowing of the skin and the whites of the eyes (jaundice). °How is this diagnosed? °This condition is diagnosed with a medical history and physical exam. You may also  have other tests, including: °· Imaging tests, such as: °¨ An ultrasound of the gallbladder. °¨ A CT scan of the abdomen. °¨ A gallbladder nuclear scan (HIDA scan). This scan allows your health care provider to see the bile moving from your liver to your gallbladder and to your small intestine. °¨ MRI. °· Blood tests, such as: °¨ A complete blood count, because the white blood cell count may be higher than normal. °¨ Liver function tests, because some levels may be higher than normal with certain types of gallstones. °How is this treated? °Treatment may include: °· Fasting for a certain amount of time. °· IV fluids. °· Medicine to treat pain or vomiting. °· Antibiotic medicine. °· Surgery to remove your gallbladder (cholecystectomy). This may happen immediately or at a later time. °Follow these instructions at home: °Home care will depend on your treatment. In general: °· Take over-the-counter and prescription medicines only as told by your health care provider. °· If you were prescribed an antibiotic medicine, take it as told by your health care provider. Do not stop taking the antibiotic even if you start to feel better. °· Follow instructions from your health care provider about what to eat or drink. When you are allowed to eat, avoid eating or drinking anything that triggers your symptoms. °· Keep all follow-up visits as told by your health care provider. This is important. °Contact a health care provider if: °· Your pain is not controlled with medicine. °· You have a fever. °Get help right away if: °· Your pain moves to another part of your abdomen or to your   back. °· You continue to have symptoms or you develop new symptoms even with treatment. °This information is not intended to replace advice given to you by your health care provider. Make sure you discuss any questions you have with your health care provider. °Document Released: 02/02/2005 Document Revised: 06/13/2015 Document Reviewed:  05/16/2014 °Elsevier Interactive Patient Education © 2017 [employer] ° °

## 2016-11-09 ENCOUNTER — Ambulatory Visit: Payer: Self-pay | Admitting: Surgery

## 2016-11-09 LAB — CULTURE, OB URINE

## 2016-11-09 LAB — GC/CHLAMYDIA PROBE AMP (~~LOC~~) NOT AT ARMC
CHLAMYDIA, DNA PROBE: NEGATIVE
NEISSERIA GONORRHEA: NEGATIVE

## 2016-11-09 NOTE — Progress Notes (Signed)
CBC WITH DIF 11-07-16 EPIC

## 2016-11-09 NOTE — Patient Instructions (Signed)
Kelsey Black  11/09/2016   Your procedure is scheduled on: Monday 11-16-16  Report to [facility] Main  Entrance Take [place]  elevators to 3rd floor to  Short Stay Center at 830 AM.   Call this number if you have problems the morning of surgery [phone]    Remember: ONLY 1 PERSON MAY GO WITH YOU TO SHORT STAY TO GET  READY MORNING OF YOUR SURGERY.  Do not eat food or drink liquids :After Midnight.     Take these medicines the morning of surgery with A SIP OF WATER  :FAMOTIDINE (PEPCID), OXYCODONE IF NEEDED, PHENERGAN IF NEEDED,                You may not have any metal on your body including hair pins and              piercings  Do not wear jewelry, make-up, lotions, powders or perfumes, deodorant             Do not wear nail polish.  Do not shave  48 hours prior to surgery.              Men may shave face and neck.   Do not bring valuables to the hospital. [place] IS NOT             RESPONSIBLE   FOR VALUABLES.  Contacts, dentures or bridgework may not be worn into surgery.  Leave suitcase in the car. After surgery it may be brought to your room.     Patients discharged the day of surgery will not be allowed to drive home.  Name and phone number of your driver:  Special Instructions: N/A              Please read over the following fact sheets you were given: _____________________________________________________________________             [facility] - Preparing for Surgery Before surgery, you can play an important role.  Because skin is not sterile, your skin needs to be as free of germs as possible.  You can reduce the number of germs on your skin by washing with CHG (chlorahexidine gluconate) soap before surgery.  CHG is an antiseptic cleaner which kills germs and bonds with the skin to continue killing germs even after washing. Please DO NOT use if you have an allergy to CHG or antibacterial soaps.  If your skin becomes  reddened/irritated stop using the CHG and inform your nurse when you arrive at Short Stay. Do not shave (including legs and underarms) for at least 48 hours prior to the first CHG shower.  You may shave your face/neck. Please follow these instructions carefully:  1.  Shower with CHG Soap the night before surgery and the  morning of Surgery.  2.  If you choose to wash your hair, wash your hair first as usual with your  normal  shampoo.  3.  After you shampoo, rinse your hair and body thoroughly to remove the  shampoo.                           4.  Use CHG as you would any other liquid soap.  You can apply chg directly  to the skin and wash  Gently with a scrungie or clean washcloth.  5.  Apply the CHG Soap to your body ONLY FROM THE NECK DOWN.   Do not use on face/ open                           Wound or open sores. Avoid contact with eyes, ears mouth and genitals (private parts).                       Wash face,  Genitals (private parts) with your normal soap.             6.  Wash thoroughly, paying special attention to the area where your surgery  will be performed.  7.  Thoroughly rinse your body with warm water from the neck down.  8.  DO NOT shower/wash with your normal soap after using and rinsing off  the CHG Soap.                9.  Pat yourself dry with a clean towel.            10.  Wear clean pajamas.            11.  Place clean sheets on your bed the night of your first shower and do not  sleep with pets. Day of Surgery : Do not apply any lotions/deodorants the morning of surgery.  Please wear clean clothes to the hospital/surgery center.  FAILURE TO FOLLOW THESE INSTRUCTIONS MAY RESULT IN THE CANCELLATION OF YOUR SURGERY PATIENT SIGNATURE_________________________________  NURSE SIGNATURE__________________________________  ________________________________________________________________________

## 2016-11-11 ENCOUNTER — Encounter (HOSPITAL_COMMUNITY)
Admission: RE | Admit: 2016-11-11 | Discharge: 2016-11-11 | Disposition: A | Discharge status: Home / Self Care | Payer: Medicaid Other | Source: Ambulatory Visit | Attending: Surgery | Admitting: Surgery

## 2016-11-11 ENCOUNTER — Encounter (HOSPITAL_COMMUNITY): Payer: Self-pay

## 2016-11-11 DIAGNOSIS — K8080 Other cholelithiasis without obstruction: Secondary | ICD-10-CM | POA: Insufficient documentation

## 2016-11-11 DIAGNOSIS — Z01810 Encounter for preprocedural cardiovascular examination: Secondary | ICD-10-CM | POA: Diagnosis not present

## 2016-11-11 DIAGNOSIS — Z01812 Encounter for preprocedural laboratory examination: Secondary | ICD-10-CM | POA: Insufficient documentation

## 2016-11-11 HISTORY — DX: Family history of other specified conditions: Z84.89

## 2016-11-11 HISTORY — DX: Calculus of gallbladder without cholecystitis without obstruction: K80.20

## 2016-11-11 HISTORY — DX: Nausea with vomiting, unspecified: R11.2

## 2016-11-11 HISTORY — DX: Encounter for supervision of normal pregnancy, unspecified, unspecified trimester: Z34.90

## 2016-11-11 LAB — URINALYSIS, ROUTINE W REFLEX MICROSCOPIC
BILIRUBIN URINE: NEGATIVE
GLUCOSE, UA: NEGATIVE mg/dL
KETONES UR: NEGATIVE mg/dL
NITRITE: POSITIVE — AB
Protein, ur: NEGATIVE mg/dL
Specific Gravity, Urine: 1.012 (ref 1.005–1.030)
pH: 7 (ref 5.0–8.0)

## 2016-11-11 NOTE — Progress Notes (Signed)
Spoke with dr Arta Bruce anesthesia and reviewed patient medical history. Anesthesia will see patient day of surgery. obsterical patients in procedural areas guideline copy placed on patient chart.

## 2016-11-11 NOTE — Progress Notes (Signed)
ROUTED UA RESULTS TO DR WHITE EPIC INBASKET.

## 2016-11-12 NOTE — Progress Notes (Signed)
Spoke with patient by phone and patient aware surgery time changed. Arrive 700 am [place] short stay 11-16-16. Called dr Henderson Cloud per patient request to make sure ua results received, no answer at dr Henderson Cloud office.

## 2016-11-12 NOTE — Progress Notes (Signed)
Spoke with sylvia at [facility] triage to report ua results from 11-11-16 to dr white and to get note faxed that patient is pregnant and dr Henderson Cloud is aware of patient having 11-16-16.

## 2016-11-13 NOTE — Progress Notes (Deleted)
  Note fromofb dr Marlow Baars ok to proceed with surgery 11-16-16 on chart

## 2016-11-13 NOTE — Progress Notes (Addendum)
Called dr Henderson Cloud office and spoke with ash dr Henderson Cloud nurse and note sent to dr white office staing patient ok for surgery per dr Leia Alf witoout fetal monitoring. Patient is asymptomatic with uti and uti will not be treated per ash  At dr Henderson Cloud office.

## 2016-11-13 NOTE — Progress Notes (Signed)
Note from dr dyanna [facility] ob ok to proceed with surgery 11-16-16 on chart

## 2016-11-13 NOTE — Progress Notes (Signed)
Spoke with Kelsey [facility] at [facility], [facility] has no note from dr Henderson Cloud at office, Kelsey Black wiil call dr Henderson Cloud office for note and fax it to [phone].

## 2016-11-15 MED ORDER — GENTAMICIN SULFATE 40 MG/ML IJ SOLN
5.0000 mg/kg | INTRAVENOUS | Status: AC
  Administered 2016-11-16: 400 mg via INTRAVENOUS
  Filled 2016-11-15: qty 10

## 2016-11-16 ENCOUNTER — Inpatient Hospital Stay (HOSPITAL_COMMUNITY)
Admission: RE | Admit: 2016-11-16 | Discharge: 2016-11-18 | DRG: 818 | Disposition: A | Discharge status: Home / Self Care | Payer: Medicaid Other | Source: Ambulatory Visit | Attending: Surgery | Admitting: Surgery

## 2016-11-16 ENCOUNTER — Ambulatory Visit (HOSPITAL_COMMUNITY): Payer: Medicaid Other | Admitting: Registered Nurse

## 2016-11-16 ENCOUNTER — Encounter (HOSPITAL_COMMUNITY)
Admission: RE | Disposition: A | Discharge status: Home / Self Care | Payer: Self-pay | Source: Ambulatory Visit | Attending: Surgery

## 2016-11-16 ENCOUNTER — Encounter (HOSPITAL_COMMUNITY): Payer: Self-pay | Admitting: Emergency Medicine

## 2016-11-16 DIAGNOSIS — O99612 Diseases of the digestive system complicating pregnancy, second trimester: Principal | ICD-10-CM | POA: Diagnosis present

## 2016-11-16 DIAGNOSIS — K8010 Calculus of gallbladder with chronic cholecystitis without obstruction: Secondary | ICD-10-CM | POA: Diagnosis present

## 2016-11-16 DIAGNOSIS — K811 Chronic cholecystitis: Secondary | ICD-10-CM | POA: Diagnosis present

## 2016-11-16 DIAGNOSIS — Z3A20 20 weeks gestation of pregnancy: Secondary | ICD-10-CM

## 2016-11-16 DIAGNOSIS — Z91013 Allergy to seafood: Secondary | ICD-10-CM

## 2016-11-16 DIAGNOSIS — Z880 Allergy status to penicillin: Secondary | ICD-10-CM

## 2016-11-16 HISTORY — PX: CHOLECYSTECTOMY: SHX55

## 2016-11-16 LAB — CBC
HEMATOCRIT: 29.7 % — AB (ref 36.0–46.0)
Hemoglobin: 9.6 g/dL — ABNORMAL LOW (ref 12.0–15.0)
MCH: 27.3 pg (ref 26.0–34.0)
MCHC: 32.3 g/dL (ref 30.0–36.0)
MCV: 84.4 fL (ref 78.0–100.0)
Platelets: 198 10*3/uL (ref 150–400)
RBC: 3.52 MIL/uL — ABNORMAL LOW (ref 3.87–5.11)
RDW: 14.7 % (ref 11.5–15.5)
WBC: 8.4 10*3/uL (ref 4.0–10.5)

## 2016-11-16 LAB — CREATININE, SERUM
Creatinine, Ser: 0.44 mg/dL (ref 0.44–1.00)
GFR calc non Af Amer: >60 mL/min (ref >60)

## 2016-11-16 LAB — GLUCOSE, CAPILLARY: GLUCOSE-CAPILLARY: 62 mg/dL — AB (ref 65–99)

## 2016-11-16 SURGERY — LAPAROSCOPIC CHOLECYSTECTOMY WITH INTRAOPERATIVE CHOLANGIOGRAM
Anesthesia: General

## 2016-11-16 MED ORDER — HYDROMORPHONE HCL-NACL 0.5-0.9 MG/ML-% IV SOSY
PREFILLED_SYRINGE | INTRAVENOUS | Status: AC
  Filled 2016-11-16: qty 3

## 2016-11-16 MED ORDER — NEOSTIGMINE METHYLSULFATE 5 MG/5ML IV SOSY
PREFILLED_SYRINGE | INTRAVENOUS | Status: DC | PRN
  Administered 2016-11-16: 4 mg via INTRAVENOUS

## 2016-11-16 MED ORDER — ONDANSETRON HCL 4 MG/2ML IJ SOLN
INTRAMUSCULAR | Status: DC | PRN
  Administered 2016-11-16: 4 mg via INTRAVENOUS

## 2016-11-16 MED ORDER — PROPOFOL 10 MG/ML IV BOLUS
INTRAVENOUS | Status: DC | PRN
  Administered 2016-11-16: 170 mg via INTRAVENOUS

## 2016-11-16 MED ORDER — LACTATED RINGERS IV SOLN
INTRAVENOUS | Status: DC
  Administered 2016-11-16 (×2): via INTRAVENOUS

## 2016-11-16 MED ORDER — IOPAMIDOL (ISOVUE-300) INJECTION 61%
INTRAVENOUS | Status: AC
  Filled 2016-11-16: qty 50

## 2016-11-16 MED ORDER — DOCUSATE SODIUM 100 MG PO CAPS
200.0000 mg | ORAL_CAPSULE | Freq: Two times a day (BID) | ORAL | Status: DC
  Administered 2016-11-17 (×2): 200 mg via ORAL
  Filled 2016-11-16 (×3): qty 2

## 2016-11-16 MED ORDER — BUPIVACAINE-EPINEPHRINE (PF) 0.25% -1:200000 IJ SOLN
INTRAMUSCULAR | Status: AC
  Filled 2016-11-16: qty 30

## 2016-11-16 MED ORDER — HEPARIN SODIUM (PORCINE) 5000 UNIT/ML IJ SOLN
5000.0000 [IU] | Freq: Once | INTRAMUSCULAR | Status: AC
  Administered 2016-11-16: 5000 [IU] via SUBCUTANEOUS
  Filled 2016-11-16: qty 1

## 2016-11-16 MED ORDER — FENTANYL CITRATE (PF) 250 MCG/5ML IJ SOLN
INTRAMUSCULAR | Status: DC | PRN
  Administered 2016-11-16 (×2): 100 ug via INTRAVENOUS
  Administered 2016-11-16: 50 ug via INTRAVENOUS

## 2016-11-16 MED ORDER — HYDROMORPHONE HCL-NACL 0.5-0.9 MG/ML-% IV SOSY
0.2500 mg | PREFILLED_SYRINGE | INTRAVENOUS | Status: DC | PRN
  Administered 2016-11-16 (×3): 0.5 mg via INTRAVENOUS

## 2016-11-16 MED ORDER — HYDROCODONE-ACETAMINOPHEN 5-325 MG PO TABS
1.0000 | ORAL_TABLET | Freq: Four times a day (QID) | ORAL | Status: DC | PRN
  Administered 2016-11-16: 2 via ORAL
  Filled 2016-11-16: qty 2

## 2016-11-16 MED ORDER — ONDANSETRON HCL 4 MG/2ML IJ SOLN
4.0000 mg | Freq: Four times a day (QID) | INTRAMUSCULAR | Status: DC | PRN
  Administered 2016-11-16 – 2016-11-17 (×2): 4 mg via INTRAVENOUS
  Filled 2016-11-16 (×2): qty 2

## 2016-11-16 MED ORDER — LACTATED RINGERS IV SOLN
INTRAVENOUS | Status: DC
  Administered 2016-11-16 (×2): via INTRAVENOUS

## 2016-11-16 MED ORDER — SIMETHICONE 80 MG PO CHEW
40.0000 mg | CHEWABLE_TABLET | Freq: Four times a day (QID) | ORAL | Status: DC | PRN
  Administered 2016-11-17 (×2): 40 mg via ORAL
  Filled 2016-11-16 (×2): qty 1

## 2016-11-16 MED ORDER — HYDROMORPHONE HCL 1 MG/ML IJ SOLN
0.5000 mg | INTRAMUSCULAR | Status: DC | PRN
  Filled 2016-11-16: qty 1

## 2016-11-16 MED ORDER — SCOPOLAMINE 1 MG/3DAYS TD PT72
MEDICATED_PATCH | TRANSDERMAL | Status: AC
  Filled 2016-11-16: qty 1

## 2016-11-16 MED ORDER — ONDANSETRON HCL 4 MG/2ML IJ SOLN
INTRAMUSCULAR | Status: AC
  Filled 2016-11-16: qty 2

## 2016-11-16 MED ORDER — ONDANSETRON 4 MG PO TBDP
4.0000 mg | ORAL_TABLET | Freq: Four times a day (QID) | ORAL | Status: DC | PRN
  Administered 2016-11-16: 4 mg via ORAL
  Filled 2016-11-16: qty 1

## 2016-11-16 MED ORDER — LACTATED RINGERS IR SOLN
Status: DC | PRN
  Administered 2016-11-16: 1000 mL

## 2016-11-16 MED ORDER — ONDANSETRON HCL 4 MG/2ML IJ SOLN
INTRAMUSCULAR | Status: AC
Start: 2016-11-16 — End: ?
  Filled 2016-11-16: qty 2

## 2016-11-16 MED ORDER — OXYCODONE HCL 5 MG PO TABS: 5.0000 mg | ORAL_TABLET | Freq: Once | ORAL | Status: DC | PRN

## 2016-11-16 MED ORDER — CHLORHEXIDINE GLUCONATE CLOTH 2 % EX PADS: 6.0000 | MEDICATED_PAD | Freq: Once | CUTANEOUS | Status: DC

## 2016-11-16 MED ORDER — PROPOFOL 10 MG/ML IV BOLUS
INTRAVENOUS | Status: AC
  Filled 2016-11-16: qty 20

## 2016-11-16 MED ORDER — GLYCOPYRROLATE 0.2 MG/ML IV SOSY
PREFILLED_SYRINGE | INTRAVENOUS | Status: AC
  Filled 2016-11-16: qty 5

## 2016-11-16 MED ORDER — LIDOCAINE 2% (20 MG/ML) 5 ML SYRINGE
INTRAMUSCULAR | Status: AC
  Filled 2016-11-16: qty 5

## 2016-11-16 MED ORDER — PHENYLEPHRINE 40 MCG/ML (10ML) SYRINGE FOR IV PUSH (FOR BLOOD PRESSURE SUPPORT)
PREFILLED_SYRINGE | INTRAVENOUS | Status: AC
  Filled 2016-11-16: qty 10

## 2016-11-16 MED ORDER — ACETAMINOPHEN 325 MG PO TABS
650.0000 mg | ORAL_TABLET | Freq: Four times a day (QID) | ORAL | Status: DC | PRN
Start: 2016-11-16 — End: 2016-11-18
  Administered 2016-11-16 – 2016-11-18 (×4): 650 mg via ORAL
  Filled 2016-11-16 (×4): qty 2

## 2016-11-16 MED ORDER — SUCCINYLCHOLINE CHLORIDE 200 MG/10ML IV SOSY
PREFILLED_SYRINGE | INTRAVENOUS | Status: AC
  Filled 2016-11-16: qty 10

## 2016-11-16 MED ORDER — GLYCOPYRROLATE 0.2 MG/ML IV SOSY
PREFILLED_SYRINGE | INTRAVENOUS | Status: DC | PRN
  Administered 2016-11-16: 0.6 mg via INTRAVENOUS

## 2016-11-16 MED ORDER — TRAMADOL HCL 50 MG PO TABS
50.0000 mg | ORAL_TABLET | Freq: Four times a day (QID) | ORAL | Status: DC | PRN
Start: 2016-11-16 — End: 2016-11-17
  Filled 2016-11-16: qty 1

## 2016-11-16 MED ORDER — 0.9 % SODIUM CHLORIDE (POUR BTL) OPTIME
TOPICAL | Status: DC | PRN
  Administered 2016-11-16: 1000 mL

## 2016-11-16 MED ORDER — SUCCINYLCHOLINE CHLORIDE 200 MG/10ML IV SOSY
PREFILLED_SYRINGE | INTRAVENOUS | Status: DC | PRN
  Administered 2016-11-16: 100 mg via INTRAVENOUS

## 2016-11-16 MED ORDER — OXYCODONE HCL 5 MG/5ML PO SOLN: 5.0000 mg | Freq: Once | ORAL | Status: DC | PRN

## 2016-11-16 MED ORDER — DEXAMETHASONE SODIUM PHOSPHATE 10 MG/ML IJ SOLN
INTRAMUSCULAR | Status: DC | PRN
  Administered 2016-11-16: 10 mg via INTRAVENOUS

## 2016-11-16 MED ORDER — HYDROCODONE-ACETAMINOPHEN 5-325 MG PO TABS: 1.0000 | ORAL_TABLET | Freq: Four times a day (QID) | ORAL | 0 refills | Status: AC | PRN

## 2016-11-16 MED ORDER — BUPIVACAINE-EPINEPHRINE 0.25% -1:200000 IJ SOLN
INTRAMUSCULAR | Status: DC | PRN
  Administered 2016-11-16: 23 mL

## 2016-11-16 MED ORDER — FENTANYL CITRATE (PF) 250 MCG/5ML IJ SOLN
INTRAMUSCULAR | Status: AC
  Filled 2016-11-16: qty 5

## 2016-11-16 MED ORDER — LIDOCAINE 2% (20 MG/ML) 5 ML SYRINGE
INTRAMUSCULAR | Status: DC | PRN
  Administered 2016-11-16: 60 mg via INTRAVENOUS

## 2016-11-16 MED ORDER — ONDANSETRON HCL 4 MG/2ML IJ SOLN
4.0000 mg | Freq: Four times a day (QID) | INTRAMUSCULAR | Status: AC | PRN
  Administered 2016-11-16: 4 mg via INTRAVENOUS

## 2016-11-16 MED ORDER — CLINDAMYCIN PHOSPHATE 900 MG/50ML IV SOLN
900.0000 mg | INTRAVENOUS | Status: AC
  Administered 2016-11-16: 900 mg via INTRAVENOUS
  Filled 2016-11-16: qty 50

## 2016-11-16 MED ORDER — EPHEDRINE SULFATE-NACL 50-0.9 MG/10ML-% IV SOSY
PREFILLED_SYRINGE | INTRAVENOUS | Status: DC | PRN
  Administered 2016-11-16: 15 mg via INTRAVENOUS

## 2016-11-16 MED ORDER — NEOSTIGMINE METHYLSULFATE 5 MG/5ML IV SOSY
PREFILLED_SYRINGE | INTRAVENOUS | Status: AC
  Filled 2016-11-16: qty 5

## 2016-11-16 MED ORDER — PHENYLEPHRINE 40 MCG/ML (10ML) SYRINGE FOR IV PUSH (FOR BLOOD PRESSURE SUPPORT)
PREFILLED_SYRINGE | INTRAVENOUS | Status: DC | PRN
  Administered 2016-11-16: 120 ug via INTRAVENOUS

## 2016-11-16 MED ORDER — HEPARIN SODIUM (PORCINE) 5000 UNIT/ML IJ SOLN
5000.0000 [IU] | Freq: Three times a day (TID) | INTRAMUSCULAR | Status: DC
  Administered 2016-11-16 – 2016-11-17 (×2): 5000 [IU] via SUBCUTANEOUS
  Filled 2016-11-16 (×4): qty 1

## 2016-11-16 MED ORDER — ACETAMINOPHEN 500 MG PO TABS
1000.0000 mg | ORAL_TABLET | ORAL | Status: AC
  Administered 2016-11-16: 1000 mg via ORAL
  Filled 2016-11-16: qty 2

## 2016-11-16 MED ORDER — SCOPOLAMINE 1 MG/3DAYS TD PT72
MEDICATED_PATCH | TRANSDERMAL | Status: DC | PRN
  Administered 2016-11-16: 1 via TRANSDERMAL

## 2016-11-16 MED ORDER — ROCURONIUM BROMIDE 10 MG/ML (PF) SYRINGE
PREFILLED_SYRINGE | INTRAVENOUS | Status: DC | PRN
  Administered 2016-11-16: 30 mg via INTRAVENOUS
  Administered 2016-11-16: 10 mg via INTRAVENOUS

## 2016-11-16 MED ORDER — EPHEDRINE 5 MG/ML INJ
INTRAVENOUS | Status: AC
  Filled 2016-11-16: qty 10

## 2016-11-16 MED ORDER — DEXAMETHASONE SODIUM PHOSPHATE 10 MG/ML IJ SOLN
INTRAMUSCULAR | Status: AC
  Filled 2016-11-16: qty 1

## 2016-11-16 MED ORDER — ROCURONIUM BROMIDE 50 MG/5ML IV SOSY
PREFILLED_SYRINGE | INTRAVENOUS | Status: AC
  Filled 2016-11-16: qty 5

## 2016-11-16 SURGICAL SUPPLY — 38 items
APPLIER CLIP 5 13 M/L LIGAMAX5 (MISCELLANEOUS) ×3
APPLIER CLIP ROT 10 11.4 M/L (STAPLE) ×3
CABLE HIGH FREQUENCY MONO STRZ (ELECTRODE) ×3 IMPLANT
CHLORAPREP W/TINT 26ML (MISCELLANEOUS) ×3 IMPLANT
CLIP APPLIE 5 13 M/L LIGAMAX5 (MISCELLANEOUS) ×1 IMPLANT
CLIP APPLIE ROT 10 11.4 M/L (STAPLE) ×1 IMPLANT
COVER MAYO STAND STRL (DRAPES) IMPLANT
COVER SURGICAL LIGHT HANDLE (MISCELLANEOUS) ×3 IMPLANT
DECANTER SPIKE VIAL GLASS SM (MISCELLANEOUS) IMPLANT
DERMABOND ADVANCED (GAUZE/BANDAGES/DRESSINGS) ×2
DERMABOND ADVANCED .7 DNX12 (GAUZE/BANDAGES/DRESSINGS) ×1 IMPLANT
DRAPE C-ARM 42X120 X-RAY (DRAPES) IMPLANT
ELECT REM PT RETURN 15FT ADLT (MISCELLANEOUS) ×3 IMPLANT
GLOVE BIOGEL M STRL SZ7.5 (GLOVE) ×3 IMPLANT
GLOVE BIOGEL PI IND STRL 8 (GLOVE) ×1 IMPLANT
GLOVE BIOGEL PI INDICATOR 8 (GLOVE) ×2
GOWN STRL REUS W/TWL XL LVL3 (GOWN DISPOSABLE) ×9 IMPLANT
GRASPER SUT TROCAR 14GX15 (MISCELLANEOUS) IMPLANT
HEMOSTAT SNOW SURGICEL 2X4 (HEMOSTASIS) IMPLANT
IRRIG SUCT STRYKERFLOW 2 WTIP (MISCELLANEOUS) ×3
IRRIGATION SUCT STRKRFLW 2 WTP (MISCELLANEOUS) ×1 IMPLANT
KIT BASIN OR (CUSTOM PROCEDURE TRAY) ×3 IMPLANT
NEEDLE INSUFFLATION 14GA 120MM (NEEDLE) ×3 IMPLANT
POUCH RETRIEVAL ECOSAC 10 (ENDOMECHANICALS) ×1 IMPLANT
POUCH RETRIEVAL ECOSAC 10MM (ENDOMECHANICALS) ×2
POUCH SPECIMEN RETRIEVAL 10MM (ENDOMECHANICALS) IMPLANT
SCISSORS LAP 5X35 DISP (ENDOMECHANICALS) ×3 IMPLANT
SET CHOLANGIOGRAPH MIX (MISCELLANEOUS) IMPLANT
SLEEVE ADV FIXATION 5X100MM (TROCAR) ×6 IMPLANT
SUT MNCRL AB 4-0 PS2 18 (SUTURE) ×3 IMPLANT
SYR 10ML ECCENTRIC (SYRINGE) ×3 IMPLANT
TOWEL OR 17X26 10 PK STRL BLUE (TOWEL DISPOSABLE) ×3 IMPLANT
TOWEL OR NON WOVEN STRL DISP B (DISPOSABLE) IMPLANT
TRAY LAPAROSCOPIC (CUSTOM PROCEDURE TRAY) ×3 IMPLANT
TROCAR ADV FIXATION 12X100MM (TROCAR) IMPLANT
TROCAR ADV FIXATION 5X100MM (TROCAR) ×3 IMPLANT
TROCAR XCEL BLUNT TIP 100MML (ENDOMECHANICALS) ×3 IMPLANT
TUBING INSUF HEATED (TUBING) ×3 IMPLANT

## 2016-11-16 NOTE — Anesthesia Procedure Notes (Signed)
Procedure Name: Intubation Date/Time: 11/16/2016 9:06 AM Performed by: Talbot Grumbling Pre-anesthesia Checklist: Patient identified, Emergency Drugs available, Suction available and Patient being monitored Patient Re-evaluated:Patient Re-evaluated prior to induction Oxygen Delivery Method: Circle system utilized Preoxygenation: Pre-oxygenation with 100% oxygen Induction Type: IV induction Ventilation: Mask ventilation without difficulty Laryngoscope Size: Mac and 3 Grade View: Grade I Tube type: Oral Tube size: 7.0 mm Number of attempts: 1 Airway Equipment and Method: Stylet Placement Confirmation: ETT inserted through vocal cords under direct vision,  positive ETCO2 and breath sounds checked- equal and bilateral Secured at: 21 cm Tube secured with: Tape Dental Injury: Teeth and Oropharynx as per pre-operative assessment

## 2016-11-16 NOTE — Transfer of Care (Signed)
Immediate Anesthesia Transfer of Care Note  Patient: Kelsey Black Colon  Procedure(s) Performed: Procedure(s): LAPAROSCOPIC CHOLECYSTECTOMY (N/A)  Patient Location: PACU  Anesthesia Type:General  Level of Consciousness:  sedated, patient cooperative and responds to stimulation  Airway & Oxygen Therapy:Patient Spontanous Breathing and Patient connected to face mask oxgen  Post-op Assessment:  Report given to PACU RN and Post -op Vital signs reviewed and stable  Post vital signs:  Reviewed and stable  Last Vitals:  Vitals:   11/16/16 0651  BP: 110/66  Pulse: 76  Resp: 18  Temp: 36.8 C  SpO2: 100%    Complications: No apparent anesthesia complications

## 2016-11-16 NOTE — H&P (Signed)
CC: Here for surgery  HPI: [name] is a 26 year old female currently pregnant at [redacted]w[redacted]d has been seen in the emergency room twice in the last two months for upper quadrant abdominal pain. Each time the pain has been most severe in her RUQ but she notes some radiation to her LUQ as well.Her most recent episode was a week and a half ago and occurred 1-2 hours after eating pizza dressed with thousand island dressing. The pain was described as unbearable and as if someone grabbed something in her right abdomen and was twisting it. The pain lasted for 3-4 hours and subsided with IV analgesics given in the ED. She denies fever or chills. She denies a history of gastritis or reflux. Prior to two months ago, she has never had this pain before. The pain is severe enough and concerning enough to her that she has altered her diet to primarily clear liquids out of fear for recurrence of the pain.  Her OB care is currently through Dr. Henderson Cloud at [facility] in [place].  Past medical history: Currently [redacted]w[redacted]d day by LMP per her OB. Denies any other medical conditions  Past surgical history: Denies.  Family history: Denies family history of cancer.  Allergies: Penicillin (causes swelling of her legs); shellfish.  Social: Denies use of tobacco, alcohol, or illicit drugs.  Labs (from epic): WBC normaly; mildly elevated liver enzymes; normal t-bilirubin; normal lipase.  RUQ [place] (11/06/16) showed cholelithiasis. No gallbladder wall thickening or pericholecystic fluid. Large gallstone measured 5 mm in length. Common bile duct measured 9 mm and there is no intrahepatic duct dilation.  Diagnostic Studies History Michel Bickers, LPN; 9/52/8413 2:44 PM) Colonoscopy  never Mammogram  never Pap Smear  1-5 years ago  Medication History Michel Bickers, LPN; 0/11/2723 3:66 PM) Pepcid (  Tablet, Oral) Active. Promethazine HCl (  Tablet, Oral) Active. Prenatal Vitamin (27-0.8MG   Tablet, Oral) Active. Percocet (5-325MG  Tablet, Oral) Active. Medications Reconciled   Review of Systems Michel Bickers LPN; 4/40/3474 2:59 PM) General Present- Appetite Loss, Fatigue and Weight Loss. Not Present- Chills, Fever, Night Sweats and Weight Gain. Skin Not Present- Change in Wart/Mole, Dryness, Hives, Jaundice, New Lesions, Non-Healing Wounds, Rash and Ulcer. HEENT Not Present- Earache, Hearing Loss, Hoarseness, Nose Bleed, Oral Ulcers, Ringing in the Ears, Seasonal Allergies, Sinus Pain, Sore Throat, Visual Disturbances, Wears glasses/contact lenses and Yellow Eyes. Respiratory Not Present- Bloody sputum, Chronic Cough, Difficulty Breathing, Snoring and Wheezing. Breast Not Present- Breast Mass, Breast Pain, Nipple Discharge and Skin Changes. Cardiovascular Not Present- Chest Pain, Difficulty Breathing Lying Down, Leg Cramps, Palpitations, Rapid Heart Rate, Shortness of Breath and Swelling of Extremities. Gastrointestinal Present- Abdominal Pain, Nausea and Vomiting. Not Present- Bloating, Bloody Stool, Change in Bowel Habits, Chronic diarrhea, Constipation, Difficulty Swallowing, Excessive gas, Gets full quickly at meals, Hemorrhoids, Indigestion and Rectal Pain. Female Genitourinary Not Present- Frequency, Nocturia, Painful Urination, Pelvic Pain and Urgency. Musculoskeletal Present- Joint Stiffness. Not Present- Back Pain, Joint Pain, Muscle Pain, Muscle Weakness and Swelling of Extremities. Neurological Present- Headaches. Not Present- Decreased Memory, Fainting, Numbness, Seizures, Tingling, Tremor, Trouble walking and Weakness. Psychiatric Not Present- Anxiety, Bipolar, Change in Sleep Pattern, Depression, Fearful and Frequent crying. Endocrine Not Present- Cold Intolerance, Excessive Hunger, Hair Changes, Heat Intolerance, Hot flashes and New Diabetes. Hematology Not Present- Blood Thinners, Easy Bruising, Excessive bleeding, Gland problems, HIV and Persistent  Infections.  Vitals:   11/16/16 0651 11/16/16 0720  BP: 110/66   Pulse: 76   Resp: 18   Temp: 98.3 F (36.8  C)   TempSrc: Oral   SpO2: 100%   Weight:  80.7 kg (178 lb)  Height:   (1.626 m)   Physical Exam General: No acute distress, conversant. Eye: Anicteric sclera, moist conjunctiva; no lid lag. ENMT Trachea midline; no thyromegaly Chest and Lung Exam: Normal respiratory effort, lungs clear to auscultation bilaterally Cardiovascular: Regular rate and rhythm; no murmurs. Abdomen: Gravid; fundus of the uterus palpable at or just above the level of the umbilicus. Abdomen is otherwise soft, nontender, nondistended. Negative Murphy's sign. No palpable hepatosplenomegaly Neuropsychiatric: Alert and oriented 3; appropriate affect MSK: Peripheral edema; no palpable extremity lymphadenopathy Lymphatic: No palpable cervical or axillary lymphadenopathy   Assessment & Plan: Symptomatic cholelithiasis in pregnancy  -The anatomy and physiology of the gallbladder was described to her. The pathophysiology of gallbladder disease was described. I believe that given her symptoms her pain is most likely originating from her gallbladder and the stones therein. I believe that she would benefit from a laparoscopic versus open cholecystectomy despite being pregnant to avoid the risk of downstream complications related to gallbladder disease which could result in increased fetal morbidity and mortality.  -The procedure (laparoscopic vs open cholecystectomy with possible intraoperative cholangiogram and any other indicated procedures) was discussed in detail using pictures and diagrams. The material risks (including, but not limited to, pain, bleeding, infection, scarring, damage to surrounding structures including uterus, viscera, bile duct, fetal mortality, preterm labor, need for additional procedures, failure to address her abdominal pain, requirement of blood transfusion, heart attack, stroke,  death), benefits and alternatives were described. Her questions were answered to her and her mother's satisfaction. She has elected to proceed with surgery.  -The above has been discussed as well with our OB team who has seen her and cleared her for surgery  Stephanie Coup. Cliffton Asters, M.D. [facility]

## 2016-11-16 NOTE — Op Note (Addendum)
11/16/2016 10:27 AM  PATIENT: Kelsey Black  26 y.o. female  Patient Care Team: Carrington Clamp, MD as PCP - General (Obstetrics and Gynecology)  PRE-OPERATIVE DIAGNOSIS: SYMPTOMATIC CHOLELITHIASIS   POST-OPERATIVE DIAGNOSIS: Chronic cholecystitis  PROCEDURE: Laparoscopic cholecystectomy  SURGEON: Stephanie Coup. Cliffton Asters, MD  ASSISTANT: Carman Ching, MD   ANESTHESIA: General endotracheal  EBL: Total I/O In: 1400 [I.V.:1400] Out: 10 [Blood:10]  DRAINS: None  SPECIMEN: Gallbladder  COUNTS: Sponge, needle and instrument counts were reported correct x2 at the conclusion of the operation  DISPOSITION: PACU in satisfactory condition  FINDINGS: Omental adhesions to gallbladder. Somewhat edematous gallbladder. Critical view of safety obtained prior to dividing any structures. Codominant anterior and posterior cystic arteries both controlled with clips.  INDICATION: Kelsey Black is a 26 year old female currently pregnant at [redacted]w[redacted]d has been seen in the emergency room twice in the last two months for upper quadrant abdominal pain. Each time the pain has been most severe in her RUQ but she notes some radiation to her LUQ as well.Her most recent episode was a week and a half ago and occurred 1-2 hours after eating pizza dressed with thousand island dressing. The pain was described as unbearable and as if someone grabbed something in her right abdomen and was twisting it. The pain lasted for 3-4 hours and subsided with IV analgesics given in the ED. She denies fever or chills. She denies a history of gastritis or reflux. Prior to two months ago, she has never had this pain before. The pain is severe enough and concerning enough to her that she has altered her diet to primarily clear liquids out of fear for recurrence of the pain. Lab work in the ED showed mild transaminitis but normal bilirubin and WBC. US showed gallstones without gallbladder wall thickening or  pericholecystic fluid. Largest gallstone was 5mm. CBD measured 9mm; there was no intrahepatic duct dilation.  The anatomy & physiology of hepatobiliary & pancreatic function was discussed. The pathophysiology of gallbladder dysfunction was discussed. Natural history and material risks without surgery was discussed. I feel the risks of no intervention will lead to serious problems that outweigh the operative risks; therefore, I recommended cholecystectomy to remove the probable pathology. I explained laparoscopic techniques with possible need for an open approach. Additionally, I discussed the possible need for a cholangiogram to evaluate the bilary tract, if indicated.  The procedure, material risks (including but not limited to bleeding; infection; scarring; abscess; bile leak; injury to other organs such as bowel, blood vessels, nerves, bile duct; hernia; the need for additional procedures; heart attack; stroke; death), benefits and alternatives to surgery were discussed at length. The patient's questions were answered to their satisfaction and the patient elected to proceed with surgery.  I noted a good likelihood this will help address the problem.  Possibility that this will not correct all abdominal symptoms was also explained.  Goals of post-operative recovery were discussed as well.     DESCRIPTION:  The patient was identified & brought into the operating room. The patient was positioned supine on the OR table with a bump under her right side shifting her into a left lateral decubitus position. She was comfortable on this roll before going to sleep. SCDs were in place and active during the entire case. The patient underwent general endotracheal anesthesia. The abdomen was prepped and draped in the standard sterile fashion and antibiotics were administered. A surgical timeout was performed and confirmed our plan.   A supraumbilical incision was made well above  the palpable funus of the gravid  uterus. The supraumbilical fascia was identified and incised. The peritoneal cavity was gently entered bluntly. A purse-string 0 Vicryl suture was placed. The Hasson cannula was inserted into the peritoneal cavity cephalad to the fundus of the uterus and insufflation with CO2 commenced to . A laparoscope was inserted into the peritoneal cavity and inspection confirmed no evidence of trocar site complications with a normal appearing although gravid uterus. The patient was then positioned in reverse Trendelenburg and her left side was down. 3 additional 5mm trocars were placed along the right subcostal line - one 5mm port in mid subcostal region, another 5mm port in the right flank near the anterior axillary line, and a third 5mm port in the left subxiphoid region obliquely near the falciform ligament.  The liver and gallbladder were inspected. The gallbladder fundus was grasped and elevated cephalad. The gallbladder was edematous and tense. A laparoscopic gallbladder aspirator was placed into the fundus of the gallbladder and the contents aspirated to decompress the gallbladder. An additional grasper was then placed on the infundibulum of the gallbladder and the infundibulum was retracted laterally.d dissect Gentle blunt dissection was then employed with a Paediatric nurse working down into [employer]. The peritoneum on both sides of the gallbladder was opened with hook cautery. The cystic duct was identified, carefully circumferentially dissected. The cystic artery branched early into codominant anterior and posterior cystic arteries - these were then carefully circumferentially dissected circumferentially. The space between the cystic artery and hepatocystic plate was developed such that the liver could be seen through a window medial to the cystic artery. The triangle of Calot was cleared of all fibrofatty tissue. At this point, a critical view of safety was achieved and the only structures visualized was  the skeletonized cystic duct laterally, the cystic artery and its respective branches, and the liver through the window medial to the artery.   The cystic duct and was then clipped with 2 clips on the "stay" side and 1 clip on the specimen side. The anterior and posterior branches of the cystic artery was then ligated in the same manner with 2 clips on the "stay" side. The cystic duct and artery were then divided. The gallbladder was then freed from its remaining attachments to the liver using electrocautery. Hemostasis was achieved and then re-verified. The rest of the abdomen was inspected no injury nor bleeding elsewhere was identified. The RUQ was irrigated with normal saline. The clips and gallbladder fossa were reinspected and noted to be in place and hemostatic.  The gallbladder was then placed into an endocatch bag and removed from the umbilical port site. The 5mm ports were removed under direct visualization and noted to be hemostatic. The umbilical fascia was then closed using 0 Vicryl suture. The skin of all incision sites was approximated with 4-0 monocryl subcuticular suture and dermabond applied. The patient was then extubated and transferred to a stretcher for transport to PACU in satisfactory condition.  I discussed the postoperative care with the patient in the holding area. I will discuss  operative findings and postoperative goals / instructions with the patient's family.  Instructions will be provided in the chart as well.  Stephanie Coup. Cliffton Asters, M.D. [facility]

## 2016-11-16 NOTE — Anesthesia Preprocedure Evaluation (Signed)
Anesthesia Evaluation  Patient identified by MRN, date of birth, ID band Patient awake    Reviewed: Allergy & Precautions, H&P , NPO status , Patient's Chart, lab work & pertinent test results  Airway Mallampati: II   Neck ROM: full    Dental   Pulmonary neg pulmonary ROS,    breath sounds clear to auscultation       Cardiovascular negative cardio ROS   Rhythm:regular Rate:Normal     Neuro/Psych    GI/Hepatic Symptomatic cholelithiasis   Endo/Other    Renal/GU      Musculoskeletal   Abdominal   Peds  Hematology   Anesthesia Other Findings   Reproductive/Obstetrics (+) Pregnancy [redacted] weeks pregnant                             Anesthesia Physical Anesthesia Plan  ASA: II  Anesthesia Plan: General   Post-op Pain Management:    Induction: Intravenous  PONV Risk Score and Plan: 3 and Ondansetron, Dexamethasone, Treatment may vary due to age or medical condition and Scopolamine patch - Pre-op  Airway Management Planned: Oral ETT  Additional Equipment:   Intra-op Plan:   Post-operative Plan: Extubation in OR  Informed Consent: I have reviewed the patients History and Physical, chart, labs and discussed the procedure including the risks, benefits and alternatives for the proposed anesthesia with the patient or authorized representative who has indicated his/her understanding and acceptance.     Plan Discussed with: CRNA, Anesthesiologist and Surgeon  Anesthesia Plan Comments:         Anesthesia Quick Evaluation

## 2016-11-16 NOTE — Anesthesia Postprocedure Evaluation (Signed)
Anesthesia Post Note  Patient: Shakari Qazi Colon  Procedure(s) Performed: LAPAROSCOPIC CHOLECYSTECTOMY (N/A )     Patient location during evaluation: PACU Anesthesia Type: General Level of consciousness: awake and alert Pain management: pain level controlled Vital Signs Assessment: post-procedure vital signs reviewed and stable Respiratory status: spontaneous breathing, nonlabored ventilation, respiratory function stable and patient connected to nasal cannula oxygen Cardiovascular status: blood pressure returned to baseline and stable Postop Assessment: no apparent nausea or vomiting Anesthetic complications: no    Last Vitals:  Vitals:   11/16/16 1135 11/16/16 1151  BP: 113/67 (!) 108/56  Pulse: 88 81  Resp: 16 16  Temp: 36.6 C 36.6 C  SpO2:  100%    Last Pain:  Vitals:   11/16/16 1135  TempSrc:   PainSc: 8                  Jowell Bossi S

## 2016-11-17 DIAGNOSIS — Z880 Allergy status to penicillin: Secondary | ICD-10-CM | POA: Diagnosis not present

## 2016-11-17 DIAGNOSIS — O99612 Diseases of the digestive system complicating pregnancy, second trimester: Secondary | ICD-10-CM | POA: Diagnosis present

## 2016-11-17 DIAGNOSIS — Z91013 Allergy to seafood: Secondary | ICD-10-CM | POA: Diagnosis not present

## 2016-11-17 DIAGNOSIS — Z3A20 20 weeks gestation of pregnancy: Secondary | ICD-10-CM | POA: Diagnosis not present

## 2016-11-17 DIAGNOSIS — K8010 Calculus of gallbladder with chronic cholecystitis without obstruction: Secondary | ICD-10-CM | POA: Diagnosis present

## 2016-11-17 LAB — COMPREHENSIVE METABOLIC PANEL
ALBUMIN: 3.2 g/dL — AB (ref 3.5–5.0)
ALK PHOS: 169 U/L — AB (ref 38–126)
ALT: 163 U/L — AB (ref 14–54)
AST: 155 U/L — AB (ref 15–41)
Anion gap: 7 (ref 5–15)
BILIRUBIN TOTAL: 1.3 mg/dL — AB (ref 0.3–1.2)
BUN: <5 mg/dL — ABNORMAL LOW (ref 6–20)
CALCIUM: 8.7 mg/dL — AB (ref 8.9–10.3)
CO2: 25 mmol/L (ref 22–32)
CREATININE: 0.48 mg/dL (ref 0.44–1.00)
Chloride: 105 mmol/L (ref 101–111)
GFR calc Af Amer: >60 mL/min (ref >60)
GFR calc non Af Amer: >60 mL/min (ref >60)
GLUCOSE: 96 mg/dL (ref 65–99)
Potassium: 4 mmol/L (ref 3.5–5.1)
SODIUM: 137 mmol/L (ref 135–145)
TOTAL PROTEIN: 6.8 g/dL (ref 6.5–8.1)

## 2016-11-17 LAB — CBC
HCT: 32.4 % — ABNORMAL LOW (ref 36.0–46.0)
HEMOGLOBIN: 10.4 g/dL — AB (ref 12.0–15.0)
MCH: 27.4 pg (ref 26.0–34.0)
MCHC: 32.1 g/dL (ref 30.0–36.0)
MCV: 85.5 fL (ref 78.0–100.0)
Platelets: 258 10*3/uL (ref 150–400)
RBC: 3.79 MIL/uL — AB (ref 3.87–5.11)
RDW: 15.2 % (ref 11.5–15.5)
WBC: 9.6 10*3/uL (ref 4.0–10.5)

## 2016-11-17 LAB — CBC WITH DIFFERENTIAL/PLATELET
BASOS ABS: 0 10*3/uL (ref 0.0–0.1)
Basophils Relative: 0 %
EOS ABS: 0 10*3/uL (ref 0.0–0.7)
Eosinophils Relative: 0 %
HCT: 33.9 % — ABNORMAL LOW (ref 36.0–46.0)
HEMOGLOBIN: 11.1 g/dL — AB (ref 12.0–15.0)
LYMPHS ABS: 1.9 10*3/uL (ref 0.7–4.0)
LYMPHS PCT: 19 %
MCH: 27.5 pg (ref 26.0–34.0)
MCHC: 32.7 g/dL (ref 30.0–36.0)
MCV: 83.9 fL (ref 78.0–100.0)
Monocytes Absolute: 0.8 10*3/uL (ref 0.1–1.0)
Monocytes Relative: 7 %
NEUTROS PCT: 74 %
Neutro Abs: 7.8 10*3/uL — ABNORMAL HIGH (ref 1.7–7.7)
Platelets: 282 10*3/uL (ref 150–400)
RBC: 4.04 MIL/uL (ref 3.87–5.11)
RDW: 15.2 % (ref 11.5–15.5)
WBC: 10.5 10*3/uL (ref 4.0–10.5)

## 2016-11-17 LAB — LIPASE, BLOOD: Lipase: 18 U/L (ref 11–51)

## 2016-11-17 MED ORDER — HYDROCODONE-ACETAMINOPHEN 5-325 MG PO TABS
1.0000 | ORAL_TABLET | Freq: Four times a day (QID) | ORAL | Status: DC | PRN
  Administered 2016-11-17 (×2): 1 via ORAL
  Filled 2016-11-17: qty 1

## 2016-11-17 MED ORDER — HYDROCODONE-ACETAMINOPHEN 10-325 MG PO TABS: 1.0000 | ORAL_TABLET | Freq: Four times a day (QID) | ORAL | Status: DC | PRN

## 2016-11-17 NOTE — Progress Notes (Signed)
Subjective No acute events. Ambulating, tolerating diet without n/v. Only abdominal discomfort is centered around her umbilical incision  Objective: Vital signs in last 24 hours: Temp:  [97.7 F (36.5 C)-98.8 F (37.1 C)] 98.8 F (37.1 C) (10/02 0328) Pulse Rate:  [66-101] 72 (10/02 0328) Resp:  [13-17] 16 (10/02 0328) BP: (94-119)/(47-72) 94/47 (10/02 0328) SpO2:  [100 %] 100 % (10/02 0328) Last BM Date: 11/15/16  Intake/Output from previous day: 10/01 0701 - 10/02 0700 In: 3550 [P.O.:600; I.V.:2950] Out: 1010 [Urine:1000; Blood:10] Intake/Output this shift: No intake/output data recorded.  Gen: NAD, comfortable CV: RRR Pulm: Normal work of breathing Abd: Gravid; soft, nontender, nondistended. Incisions covered in dermabond; clean/dry/intact without erythema Ext: SCDs in place  Lab Results: CBC   Recent Labs  11/16/16 1424 11/17/16 0523  WBC 8.4 9.6  HGB 9.6* 10.4*  HCT 29.7* 32.4*  PLT 198 258   BMET  Recent Labs  11/16/16 1424  CREATININE 0.44    Anti-infectives: Anti-infectives    Start     Dose/Rate Route Frequency Ordered Stop   11/16/16 0700  clindamycin (CLEOCIN) IVPB 900 mg     900 mg 100 mL/hr over 30 Minutes Intravenous On call to O.R. 11/16/16 0650 11/16/16 0915   11/16/16 0600  gentamicin (GARAMYCIN) 400 mg in dextrose 5 % 100 mL IVPB     5 mg/kg  80.7 kg 110 mL/hr over 60 Minutes Intravenous On call to O.R. 11/15/16 1325 11/16/16 1015       Assessment/Plan: Patient Active Problem List   Diagnosis Date Noted  . Chronic cholecystitis 11/16/2016  . Bacterial vaginosis 08/01/2014   s/p Procedure(s): LAPAROSCOPIC CHOLECYSTECTOMY 11/16/2016 - POD#1  -Diet as tolerated -Ambulate 5x/day, IS 10x/hr while awake -D/C IVF, D/C IV narcotics; PO analgesia -Possible D/C late morning/early afternoon if tolerating diet, pain controlled, no n/v.  Andria Meuse, MD [facility]

## 2016-11-17 NOTE — Progress Notes (Signed)
Patient seen and examined this afternoon. Persistent abdominal wall discomfort, nausea, not reliably tolerating diet but denies emesis. Has also been seen by our OB team/nurses with reassuring fetal heart rates.  AF VS nml NAD RRR Normal work of breathing Abd gravid, soft, expected tenderness along RUQ, no tenderness anywhere else. No r/g.  Labs reviewed - mild transaminitis; tbili 1.3 from 1.0 at baseline. No leukocytosis. H&H stable.  -Will plan to keep overnight tonight, repeat LFTs to ensure down-trending and await reliable toleration of food -Minimize IV narcotics -PO tylenol + PO hydrocodone for breakthrough  Stephanie Coup. Cliffton Asters, M.D. [facility]

## 2016-11-17 NOTE — Discharge Instructions (Signed)
HERNIA REPAIR: POST OP INSTRUCTIONS  1. DIET: As tolerated. Follow a light bland diet the first 24 hours after arrival home, such as soup, liquids, crackers, etc.  Be sure to include lots of fluids daily.  Avoid fast food or heavy meals as your are more likely to get nauseated.  Eat a low fat the next few days after surgery.  2. Take your usually prescribed home medications unless otherwise directed.  3. PAIN CONTROL: a. Pain is best controlled by a usual combination of three different methods TOGETHER: i. Ice/Heat ii. Over the counter pain medication iii. Prescription pain medication b. Most patients will experience some swelling and bruising around the surgical site.  Ice packs or heating pads (30-60 minutes up to 6 times a day) will help. Some people prefer to use ice alone, heat alone, alternating between ice & heat.  Experiment to what works for you.  Swelling and bruising can take several weeks to resolve.   c. It is helpful to take an over-the-counter pain medication regularly for the first few weeks: i. Ibuprofen (Motrin/Advil) -  tabs - take 3 tabs ( ) every 6 hours as needed for pain ii. Acetaminophen (Tylenol) - you may take  every 6 hours as needed. You can take this with motrin as they act differently on the body. If you are taking a narcotic pain medication that has acetaminophen in it, do not take over the counter tylenol at the same time.  Iii. NOTE: You may take both of these medications together - most patients  find it most helpful when alternating between the two (i.e. Ibuprofen at 6am,  tylenol at 9am, ibuprofen at 12pm ...) d. A  prescription for pain medication should be given to you upon discharge.  Take your pain medication as prescribed if your pain is not adequatly controlled with the over-the-counter pain reliefs mentioned above.  4. Avoid getting constipated.  Between the surgery and the pain medications, it is common to experience some constipation.   Increasing fluid intake and taking a fiber supplement (such as Metamucil, Citrucel, FiberCon, MiraLax, etc) 1-2 times a day regularly will usually help prevent this problem from occurring.  A mild laxative (prune juice, Milk of Magnesia, MiraLax, etc) should be taken according to package directions if there are no bowel movements after 48 hours.    5. Dressing: Your incision is covered in Dermabond which is like sterile superglue for the skin. This will come off on it's own in a couple weeks. It is waterproof and you may bathe normally starting the day after your surgery in a shower. Avoid baths/pools/lakes/oceans until your wounds have fully healed.  6. ACTIVITIES as tolerated:   a. Avoid heavy lifting (>10lbs or 1 galloon of milk) for the next 6 weeks. b. You may resume regular (light) daily activities beginning the next day--such as daily self-care, walking, climbing stairs--gradually increasing activities as tolerated.  If you can walk 30 minutes without difficulty, it is safe to try more intense activity such as jogging, treadmill, bicycling, low-impact aerobics.  c. DO NOT PUSH THROUGH PAIN.  Let pain be your guide: If it hurts to do something, don't do it. d. Kelsey Black may drive when you are no longer taking prescription pain medication, you can comfortably wear a seatbelt, and you can safely maneuver your car and apply brakes. e. Kelsey Black may have sexual intercourse when it is comfortable.   7. Follow-up in our office a. Please call [facility] at [phone] to set up an  appointment to see your surgeon in the office for a follow-up appointment approximately 2-3 weeks after your surgery.  b. Make sure that you call for this appointment the day you arrive home to insure a convenient appointment time. 9.  If you have any forms that need to be completed like disability of Family Leave forms, please bring them to our office and one of our staff members will assist you in completing this paperwork. Please do not  give them to your doctor  WHEN TO CALL us [phone]: 1. Poor pain control 2. Reactions / problems with new medications (rash/itching, nausea, etc)  3. Fever over 101.5 F (38.5 C) 4. Inability to urinate 5. Nausea and/or vomiting  6. Worsening swelling or bruising 7. Continued bleeding from incision. 8. Increased pain, redness, or drainage from the incision   The clinic staff is available to answer your questions during regular business hours (8:30am-5pm).  Please dont hesitate to call and ask to speak to one of our nurses for clinical concerns.   If you have a medical emergency, go to the nearest emergency room or call 911.  A surgeon from [facility] is always on call at the hospitals in [facility], [place] [address], [place], [place]  [address] MAIN: [phone] FAX: [phone] [email or web address]

## 2016-11-17 NOTE — Progress Notes (Signed)
Pt reports no leaking or bleeding.  Abdomen palpates soft and nontender.  Denies cramping/cxn pain.  Does not really feel well after surgery yesterday.  Has appt with Dr Henderson Cloud on 10/23 for her normal OB visit.

## 2016-11-18 LAB — COMPREHENSIVE METABOLIC PANEL
ALT: 142 U/L — ABNORMAL HIGH (ref 14–54)
ANION GAP: 6 (ref 5–15)
AST: 88 U/L — ABNORMAL HIGH (ref 15–41)
Albumin: 3 g/dL — ABNORMAL LOW (ref 3.5–5.0)
Alkaline Phosphatase: 149 U/L — ABNORMAL HIGH (ref 38–126)
BILIRUBIN TOTAL: 1 mg/dL (ref 0.3–1.2)
BUN: 6 mg/dL (ref 6–20)
CO2: 26 mmol/L (ref 22–32)
Calcium: 8.7 mg/dL — ABNORMAL LOW (ref 8.9–10.3)
Chloride: 105 mmol/L (ref 101–111)
Creatinine, Ser: 0.58 mg/dL (ref 0.44–1.00)
GFR calc Af Amer: >60 mL/min (ref >60)
Glucose, Bld: 80 mg/dL (ref 65–99)
POTASSIUM: 4.5 mmol/L (ref 3.5–5.1)
Sodium: 137 mmol/L (ref 135–145)
TOTAL PROTEIN: 6.4 g/dL — AB (ref 6.5–8.1)

## 2016-11-18 NOTE — Discharge Summary (Signed)
Patient ID: Kelsey Black MRN: [id number] DOB/AGE: Mar 08, 1990 25 y.o.  Admit date: 11/16/2016 Discharge date: 11/18/2016  Discharge Diagnoses Patient Active Problem List   Diagnosis Date Noted  . Chronic cholecystitis 11/16/2016  . Bacterial vaginosis 08/01/2014    Consultants None  Procedures Laparoscopic cholecystectomy 11/16/16  HPI: [name] is a 26 year old female currently pregnant at [redacted]w[redacted]d has been seen in the emergency room twice in the last two months for upper quadrant abdominal pain. Each time the pain has been most severe in her RUQ but she notes some radiation to her LUQ as well.Her most recent episode was a week and a half ago and occurred 1-2 hours after eating pizza dressed with thousand island dressing. The pain was described as unbearable and as if someone grabbed something in her right abdomen and was twisting it. The pain lasted for 3-4 hours and subsided with IV analgesics given in the ED. She denies fever or chills. She denies a history of gastritis or reflux. Prior to two months ago, she has never had this pain before. The pain is severe enough and concerning enough to her that she has altered her diet to primarily clear liquids out of fear for recurrence of the pain.  Her OB care is currently through Dr. Henderson Cloud at [facility] in [place].  Hospital Course: Patient was taken to the operating room 11/16/16 and underwent to above procedure. Intraop findings of chronic cholecystitis. Procedure was uneventful. Fetal heart tones present prior to and after procedure as well as while in house in the ward. POD#0 she had significant pain and nausea and wasn't able to eat. On POD#1 she attempted food but again felt nauseated and complained of severe pain. Her LFTs were mildly elevated; tbili 1.3. Lipase was normal. Hgb stable. Labs trended and normalized on 10/3 including her bilirubin. She was tolerating a regular diet 10/3 and her pain was well  controlled with oral anaglesics.  Vitals:   11/17/16 0328 11/17/16 1243 11/17/16 2220 11/18/16 0523  BP: (!) 94/47 116/67 (!) 92/46 (!) 96/44  Pulse: 72 79 69 75  Resp: Temp: 98.8 F (37.1 C) 98.1 F (36.7 C) 98.4 F (36.9 C) 98.7 F (37.1 C)  TempSrc: Oral Oral Oral Oral  SpO2: 100% 100% 100% 100%  Weight:      Height:       NAD, comfortable and up ambulating CV RRR Pulm normal work of breathing Abd gravid, soft, NT/ND; incisions dressed with dermabond - clean/dry/intact without erythema  Patient deemed stable for discharge home. Will f/u in my office in 2-3 weeks   Allergies as of 11/18/2016      Reactions   Penicillins Swelling, Other (See Comments)   Reaction:  Arm/leg swelling  Has patient had a PCN reaction causing immediate rash, facial/tongue/throat swelling, SOB or lightheadedness with hypotension: Yes Has patient had a PCN reaction causing severe rash involving mucus membranes or skin necrosis: No Has patient had a PCN reaction that required hospitalization: No Has patient had a PCN reaction occurring within the last 10 years: No If all of the above answers are "NO", then may proceed with Cephalosporin use.   Shrimp [shellfish Allergy] Swelling, Other (See Comments)   Localized swelling      Medication List    STOP taking these medications   oxyCODONE-acetaminophen 5-325 MG tablet Commonly known as:  PERCOCET/ROXICET     TAKE these medications   famotidine 20 MG tablet Commonly known as:  PEPCID Take  1 tablet (20 mg total) by mouth 2 (two) times daily.   HYDROcodone-acetaminophen 5-325 MG tablet Commonly known as:  NORCO/VICODIN Take 1-2 tablets by mouth every 6 (six) hours as needed (pain).   ondansetron 4 MG disintegrating tablet Commonly known as:  ZOFRAN ODT Take 1 tablet (4 mg total) by mouth every 6 (six) hours as needed for nausea.   prenatal vitamin w/FE, FA 29-1 MG Chew chewable tablet Chew 1 tablet by mouth daily at 12 noon.    promethazine 25 MG tablet Commonly known as:  PHENERGAN Take 0.5-1 tablets (12.5-25 mg total) by mouth every 6 (six) hours as needed. What changed:  reasons to take this       Stephanie Coup. Cliffton Asters, M.D. [facility]

## 2017-01-18 ENCOUNTER — Inpatient Hospital Stay (HOSPITAL_COMMUNITY)
Admission: AD | Admit: 2017-01-18 | Discharge: 2017-01-18 | Disposition: A | Discharge status: Home / Self Care | Payer: Medicaid Other | Source: Ambulatory Visit | Attending: Obstetrics and Gynecology | Admitting: Obstetrics and Gynecology

## 2017-01-18 ENCOUNTER — Encounter (HOSPITAL_COMMUNITY): Payer: Self-pay | Admitting: *Deleted

## 2017-01-18 DIAGNOSIS — M549 Dorsalgia, unspecified: Secondary | ICD-10-CM

## 2017-01-18 DIAGNOSIS — Z880 Allergy status to penicillin: Secondary | ICD-10-CM | POA: Diagnosis not present

## 2017-01-18 DIAGNOSIS — Z9049 Acquired absence of other specified parts of digestive tract: Secondary | ICD-10-CM | POA: Diagnosis not present

## 2017-01-18 DIAGNOSIS — O26893 Other specified pregnancy related conditions, third trimester: Secondary | ICD-10-CM | POA: Insufficient documentation

## 2017-01-18 DIAGNOSIS — Z91013 Allergy to seafood: Secondary | ICD-10-CM | POA: Insufficient documentation

## 2017-01-18 DIAGNOSIS — Z825 Family history of asthma and other chronic lower respiratory diseases: Secondary | ICD-10-CM | POA: Insufficient documentation

## 2017-01-18 DIAGNOSIS — Z3A29 29 weeks gestation of pregnancy: Secondary | ICD-10-CM | POA: Insufficient documentation

## 2017-01-18 DIAGNOSIS — O9989 Other specified diseases and conditions complicating pregnancy, childbirth and the puerperium: Secondary | ICD-10-CM

## 2017-01-18 DIAGNOSIS — M545 Low back pain: Secondary | ICD-10-CM | POA: Diagnosis present

## 2017-01-18 DIAGNOSIS — O99891 Other specified diseases and conditions complicating pregnancy: Secondary | ICD-10-CM

## 2017-01-18 DIAGNOSIS — R42 Dizziness and giddiness: Secondary | ICD-10-CM

## 2017-01-18 LAB — CBC
HEMATOCRIT: 32.6 % — AB (ref 36.0–46.0)
HEMOGLOBIN: 10.2 g/dL — AB (ref 12.0–15.0)
MCH: 27.2 pg (ref 26.0–34.0)
MCHC: 31.3 g/dL (ref 30.0–36.0)
MCV: 86.9 fL (ref 78.0–100.0)
Platelets: 298 10*3/uL (ref 150–400)
RBC: 3.75 MIL/uL — AB (ref 3.87–5.11)
RDW: 16.2 % — ABNORMAL HIGH (ref 11.5–15.5)
WBC: 11.1 10*3/uL — ABNORMAL HIGH (ref 4.0–10.5)

## 2017-01-18 LAB — URINALYSIS, ROUTINE W REFLEX MICROSCOPIC
Bilirubin Urine: NEGATIVE
GLUCOSE, UA: NEGATIVE mg/dL
Hgb urine dipstick: NEGATIVE
Ketones, ur: NEGATIVE mg/dL
Nitrite: NEGATIVE
PH: 6 (ref 5.0–8.0)
Protein, ur: NEGATIVE mg/dL
Specific Gravity, Urine: 1.024 (ref 1.005–1.030)

## 2017-01-18 LAB — COMPREHENSIVE METABOLIC PANEL
ALBUMIN: 2.9 g/dL — AB (ref 3.5–5.0)
ALK PHOS: 127 U/L — AB (ref 38–126)
ALT: 24 U/L (ref 14–54)
ANION GAP: 6 (ref 5–15)
AST: 20 U/L (ref 15–41)
BILIRUBIN TOTAL: 0.5 mg/dL (ref 0.3–1.2)
BUN: 8 mg/dL (ref 6–20)
CALCIUM: 8.6 mg/dL — AB (ref 8.9–10.3)
CO2: 25 mmol/L (ref 22–32)
CREATININE: 0.36 mg/dL — AB (ref 0.44–1.00)
Chloride: 104 mmol/L (ref 101–111)
GFR calc non Af Amer: >60 mL/min (ref >60)
GLUCOSE: 80 mg/dL (ref 65–99)
Potassium: 4.4 mmol/L (ref 3.5–5.1)
Sodium: 135 mmol/L (ref 135–145)
TOTAL PROTEIN: 7.3 g/dL (ref 6.5–8.1)

## 2017-01-18 LAB — WET PREP, GENITAL
Clue Cells Wet Prep HPF POC: NONE SEEN
Sperm: NONE SEEN
Trich, Wet Prep: NONE SEEN
YEAST WET PREP: NONE SEEN

## 2017-01-18 NOTE — Discharge Instructions (Signed)

## 2017-01-18 NOTE — MAU Provider Note (Signed)
History  CSN: [id number] Arrival date and time: 01/18/17 1601  First Provider Initiated Contact with Patient 01/18/17 1649     Chief Complaint  Patient presents with  . Back Pain  . Abdominal Pain  . Dizziness    HPI: Kelsey Black is a 26 y.o. G1P0 with IUP at 3188w1d who presents to maternity admissions reporting lower back and abdominal pain. Reports this started while she was picking up her siblings from school after there was a school shooting. Reports feeling stressed and felt dizzy at the time as well. Felt like she was going to pass out, but did not pass out. Dizziness has since improved. Denies any SOB or chest pain. Back pain is on mid lower back and some on her lower abdomen as well. Denies vaginal discharge, or vaginal bleeding. Also denies any fever, chill, vomiting, diarrhea, urinary frequency, dysuria, or hematuria. Her pregnancy course is notable for cholecystectomy done on two months ago (11/16/16). She reports she has recovered well. Report good po intake, but reports she does not drink much fluids.   OB History  Gravida Para Term Preterm AB Living  1 0 0 0 0    SAB TAB Ectopic Multiple Live Births  0 0 0        # Outcome Date GA Lbr Len/2nd Weight Sex Delivery Anes PTL Lv  1 Current              Past Medical History:  Diagnosis Date  . Anemia   . Family history of adverse reaction to anesthesia    mother woke up during abdominal surgery 3 years ago  . Nausea & vomiting    last month  . Pregnancy    19 weeks and few days michelle horvath ob dr  . Symptomatic cholelithiasis    Past Surgical History:  Procedure Laterality Date  . CHOLECYSTECTOMY N/A 11/16/2016   Procedure: LAPAROSCOPIC CHOLECYSTECTOMY;  Surgeon: Andria MeuseWhite, Christopher M, MD;  Location: [facility];  Service: General;  Laterality: N/A;  . TOOTH EXTRACTION     Family History  Problem Relation Age of Onset  . Asthma Mother   . Asthma Sister   . Asthma Brother    Social History    Socioeconomic History  . Marital status: Single    Spouse name: Not on file  . Number of children: Not on file  . Years of education: Not on file  . Highest education level: Not on file  Social Needs  . Financial resource strain: Not on file  . Food insecurity - worry: Not on file  . Food insecurity - inability: Not on file  . Transportation needs - medical: Not on file  . Transportation needs - non-medical: Not on file  Occupational History  . Not on file  Tobacco Use  . Smoking status: Never Smoker  . Smokeless tobacco: Never Used  Substance and Sexual Activity  . Alcohol use: No    Alcohol/week: 0.0 oz  . Drug use: No  . Sexual activity: Yes  Other Topics Concern  . Not on file  Social History Narrative   ** Merged History Encounter **       Allergies  Allergen Reactions  . Penicillins Swelling and Other (See Comments)    Reaction:  Arm/leg swelling  Has patient had a PCN reaction causing immediate rash, facial/tongue/throat swelling, SOB or lightheadedness with hypotension: Yes Has patient had a PCN reaction causing severe rash involving mucus membranes or skin necrosis: No Has patient had a  PCN reaction that required hospitalization: No Has patient had a PCN reaction occurring within the last 10 years: No If all of the above answers are "NO", then may proceed with Cephalosporin use.  . Shrimp [Shellfish Allergy] Swelling and Other (See Comments)    Localized swelling    Medications Prior to Admission  Medication Sig Dispense Refill Last Dose  . famotidine (PEPCID) 20 MG tablet Take 1 tablet (20 mg total) by mouth 2 (two) times daily. 60 tablet 5 Past Month at Unknown time  . ondansetron (ZOFRAN ODT) 4 MG disintegrating tablet Take 1 tablet (4 mg total) by mouth every 6 (six) hours as needed for nausea. (Patient not taking: Reported on 11/09/2016) 20 tablet 0 Not Taking at Unknown time  . prenatal vitamin w/FE, FA (NATACHEW) 29-1 MG CHEW chewable tablet Chew 1  tablet by mouth daily at 12 noon.   11/15/2016 at Unknown time  . promethazine (PHENERGAN) 25 MG tablet Take 0.5-1 tablets (12.5-25 mg total) by mouth every 6 (six) hours as needed. (Patient taking differently: Take 12.5-25 mg by mouth every 6 (six) hours as needed for nausea or vomiting. ) 30 tablet 2 Past Month at Unknown time    I have reviewed patient's Past Medical Hx, Surgical Hx, Family Hx, Social Hx, medications and allergies.   Review of Systems: Negative except for what is mentioned in HPI.  Physical Exam   Blood pressure 115/67, pulse 85, temperature 98.1 F (36.7 C), temperature source Oral, resp. rate 16, height 5\' 5"  (1.651 m), weight 189 lb (85.7 kg), last menstrual period 05/26/2016, SpO2 100 %.  Constitutional: Well-developed, well-nourished female in no acute distress.  HENT: [place]/AT, normal oropharynx mucosa. MMM Eyes: normal conjunctivae, no scleral icterus Cardiovascular: normal rate, regular rhythm Respiratory: normal effort, lungs CTAB.  GI: Abd soft, non-tender, gravid appropriate for gestational age.   GU: Neg CVAT bilaterally. Pelvic: NEFG. Normal vaginal mucosa without lesions. Cervix with nabothian cyst, otherwise normal appearing, visually closed, without lesion, scant  discharge. Closed/long MSK: Extremities nontender, no edema Neurologic: Alert and oriented x 4. Psych: Normal mood and affect Skin: warm and dry   FHT:  Baseline 140 , moderate variability, accelerations present, no decelerations Toco: no contractions tracing  MAU Course/MDM:   Nursing notes and VS reviewed. Patient seen and examined, as noted above. VS wnl, O2 sat 100%.  Had dizziness earlier in the setting of stress, but now improved. Some back pain. No contractions on toco. SVE reassuring. Cultures collected.  CBC and CMP ordered given she is 26-months post-op and poor fluid intake.   Results reviewed:  Results for orders placed or performed during the hospital encounter of 01/18/17   Wet prep, genital  Result Value Ref Range   Yeast Wet Prep HPF POC NONE SEEN NONE SEEN   Trich, Wet Prep NONE SEEN NONE SEEN   Clue Cells Wet Prep HPF POC NONE SEEN NONE SEEN   WBC, Wet Prep HPF POC MANY (A) NONE SEEN   Sperm NONE SEEN   Urinalysis, Routine w reflex microscopic  Result Value Ref Range   Color, Urine AMBER (A) YELLOW   APPearance CLEAR CLEAR   Specific Gravity, Urine 1.024 1.005 - 1.030   pH 6.0 5.0 - 8.0   Glucose, UA NEGATIVE NEGATIVE mg/dL   Hgb urine dipstick NEGATIVE NEGATIVE   Bilirubin Urine NEGATIVE NEGATIVE   Ketones, ur NEGATIVE NEGATIVE mg/dL   Protein, ur NEGATIVE NEGATIVE mg/dL   Nitrite NEGATIVE NEGATIVE   Leukocytes, UA TRACE (A)  NEGATIVE   RBC / HPF 0-5 0 - 5 RBC/hpf   WBC, UA 6-30 0 - 5 WBC/hpf   Bacteria, UA RARE (A) NONE SEEN   Squamous Epithelial / LPF 6-30 (A) NONE SEEN   Mucus PRESENT   CBC  Result Value Ref Range   WBC 11.1 (H) 4.0 - 10.5 K/uL   RBC 3.75 (L) 3.87 - 5.11 MIL/uL   Hemoglobin 10.2 (L) 12.0 - 15.0 g/dL   HCT 29.532.6 (L) 62.136.0 - 30.846.0 %   MCV 86.9 78.0 - 100.0 fL   MCH 27.2 26.0 - 34.0 pg   MCHC 31.3 30.0 - 36.0 g/dL   RDW 65.716.2 (H) 84.611.5 - 96.215.5 %   Platelets 298 150 - 400 K/uL  Comprehensive metabolic panel  Result Value Ref Range   Sodium 135 135 - 145 mmol/L   Potassium 4.4 3.5 - 5.1 mmol/L   Chloride 104 101 - 111 mmol/L   CO2 25 22 - 32 mmol/L   Glucose, Bld 80 65 - 99 mg/dL   BUN 8 6 - 20 mg/dL   Creatinine, Ser 9.520.36 (L) 0.44 - 1.00 mg/dL   Calcium 8.6 (L) 8.9 - 10.3 mg/dL   Total Protein 7.3 6.5 - 8.1 g/dL   Albumin 2.9 (L) 3.5 - 5.0 g/dL   AST 20 15 - 41 U/L   ALT 24 14 - 54 U/L   Alkaline Phosphatase 127 (H) 38 - 126 U/L   Total Bilirubin 0.5 0.3 - 1.2 mg/dL   GFR calc non Af Amer >60 >60 mL/min   GFR calc Af Amer >60 >60 mL/min   Anion gap 6 5 - 15   Labs reassuring.  Pt doing well on re-evaluation. Only mild back pain.   Discussed with Dr. Claiborne Billingsallahan.   Assessment and Plan  Assessment: 1. Back pain  affecting pregnancy in third trimester   2. Lightheadedness     Plan: --Discharge home in stable condition and return precautions.  --Dicussed management of back pain at home, including Tylenol, heat, pregnancy support belt.  --Follow up at regular [facility] next week.    Degele, Kandra NicolasJulie P, MD 01/18/2017 4:50 PM

## 2017-01-18 NOTE — MAU Note (Signed)
Patient was picking up her siblings from their school after there was a school shooting.  When going to pick up the kids "under a lot of stress."  Pt started feeling cramping in lower back and abdomen.  Also feeling faint and that "everything was going black."  Pt never passed out.  Felt better after drinking water.  Reports has only had sips of water today and some cereal earlier.

## 2017-01-19 LAB — GC/CHLAMYDIA PROBE AMP (~~LOC~~) NOT AT ARMC
Chlamydia: NEGATIVE
NEISSERIA GONORRHEA: NEGATIVE

## 2017-01-27 ENCOUNTER — Encounter (HOSPITAL_COMMUNITY): Payer: Self-pay

## 2017-01-27 ENCOUNTER — Inpatient Hospital Stay (HOSPITAL_COMMUNITY)
Admission: AD | Admit: 2017-01-27 | Discharge: 2017-01-27 | Disposition: A | Discharge status: Home / Self Care | Payer: Medicaid Other | Source: Ambulatory Visit | Attending: Obstetrics and Gynecology | Admitting: Obstetrics and Gynecology

## 2017-01-27 DIAGNOSIS — Z3A30 30 weeks gestation of pregnancy: Secondary | ICD-10-CM | POA: Diagnosis not present

## 2017-01-27 DIAGNOSIS — Z880 Allergy status to penicillin: Secondary | ICD-10-CM | POA: Diagnosis not present

## 2017-01-27 DIAGNOSIS — Z3689 Encounter for other specified antenatal screening: Secondary | ICD-10-CM

## 2017-01-27 DIAGNOSIS — O368130 Decreased fetal movements, third trimester, not applicable or unspecified: Secondary | ICD-10-CM | POA: Diagnosis not present

## 2017-01-27 LAB — URINALYSIS, ROUTINE W REFLEX MICROSCOPIC
BILIRUBIN URINE: NEGATIVE
Glucose, UA: NEGATIVE mg/dL
HGB URINE DIPSTICK: NEGATIVE
KETONES UR: NEGATIVE mg/dL
Leukocytes, UA: NEGATIVE
Nitrite: NEGATIVE
Protein, ur: NEGATIVE mg/dL
SPECIFIC GRAVITY, URINE: 1.004 — AB (ref 1.005–1.030)
pH: 6 (ref 5.0–8.0)

## 2017-01-27 NOTE — MAU Note (Signed)
Pt states she fell on Monday landing on her abdomen. Has not felt baby move today. Denies any VB or pain

## 2017-01-27 NOTE — MAU Provider Note (Signed)
Chief Complaint:  Decreased Fetal Movement   First Provider Initiated Contact with Patient 01/27/17 1429      HPI: Kelsey Black is a 26 y.o. G1P0000 at 2030w3dwho presents to maternity admissions reporting decreased fetal movement today.  She fell 2 days ago outside on the ice but did not have pain and felt good fetal movement after the fall so did not come in. Today, she reports less fetal movement than usual. She ate breakfast and still did not feel any movement so she called the office and was told to come to MAU for evaluation.  There are no other associated symptoms.  She denies pain today. She denies LOF, vaginal bleeding, vaginal itching/burning, urinary symptoms, h/a, dizziness, n/v, or fever/chills.    HPI  Past Medical History: Past Medical History:  Diagnosis Date  . Anemia   . Family history of adverse reaction to anesthesia    mother woke up during abdominal surgery 3 years ago  . Nausea & vomiting    last month  . Pregnancy    19 weeks and few days michelle horvath ob dr  . Symptomatic cholelithiasis     Past obstetric history: OB History  Gravida Para Term Preterm AB Living  1 0 0 0 0    SAB TAB Ectopic Multiple Live Births  0 0 0        # Outcome Date GA Lbr Len/2nd Weight Sex Delivery Anes PTL Lv  1 Current               Past Surgical History: Past Surgical History:  Procedure Laterality Date  . CHOLECYSTECTOMY N/A 11/16/2016   Procedure: LAPAROSCOPIC CHOLECYSTECTOMY;  Surgeon: Andria MeuseWhite, Christopher M, MD;  Location: [facility];  Service: General;  Laterality: N/A;  . TOOTH EXTRACTION      Family History: Family History  Problem Relation Age of Onset  . Asthma Mother   . Asthma Sister   . Asthma Brother     Social History: Social History   Tobacco Use  . Smoking status: Never Smoker  . Smokeless tobacco: Never Used  Substance Use Topics  . Alcohol use: No    Alcohol/week: 0.0 oz  . Drug use: No    Allergies:  Allergies  Allergen  Reactions  . Penicillins Swelling and Other (See Comments)    Reaction:  Arm/leg swelling  Has patient had a PCN reaction causing immediate rash, facial/tongue/throat swelling, SOB or lightheadedness with hypotension: Yes Has patient had a PCN reaction causing severe rash involving mucus membranes or skin necrosis: No Has patient had a PCN reaction that required hospitalization: No Has patient had a PCN reaction occurring within the last 10 years: No If all of the above answers are "NO", then may proceed with Cephalosporin use.  . Shrimp [Shellfish Allergy] Swelling and Other (See Comments)    Localized swelling    Meds:  Medications Prior to Admission  Medication Sig Dispense Refill Last Dose  . famotidine (PEPCID) 20 MG tablet Take 1 tablet (20 mg total) by mouth 2 (two) times daily. 60 tablet 5 01/26/2017 at Unknown time  . Prenatal MV & Min w/FA-DHA (PRENATAL ADULT GUMMY/DHA/FA) 0.4-25 MG CHEW Chew 3 tablets by mouth daily.   01/26/2017 at Unknown time  . ondansetron (ZOFRAN ODT) 4 MG disintegrating tablet Take 1 tablet (4 mg total) by mouth every 6 (six) hours as needed for nausea. (Patient not taking: Reported on 11/09/2016) 20 tablet 0 Not Taking at Unknown time  . promethazine (PHENERGAN)  25 MG tablet Take 0.5-1 tablets (12.5-25 mg total) by mouth every 6 (six) hours as needed. (Patient taking differently: Take 12.5-25 mg by mouth every 6 (six) hours as needed for nausea or vomiting. ) 30 tablet 2 Past Month at Unknown time    ROS:  Review of Systems  Constitutional: Negative for chills, fatigue and fever.  Eyes: Negative for visual disturbance.  Respiratory: Negative for shortness of breath.   Cardiovascular: Negative for chest pain.  Gastrointestinal: Negative for abdominal pain, nausea and vomiting.  Genitourinary: Negative for difficulty urinating, dysuria, flank pain, pelvic pain, vaginal bleeding, vaginal discharge and vaginal pain.  Neurological: Negative for dizziness and  headaches.  Psychiatric/Behavioral: Negative.      I have reviewed patient's Past Medical Hx, Surgical Hx, Family Hx, Social Hx, medications and allergies.   Physical Exam  No data found. Constitutional: Well-developed, well-nourished female in no acute distress.  Cardiovascular: normal rate Respiratory: normal effort GI: Abd soft, non-tender, gravid appropriate for gestational age.  MS: Extremities nontender, no edema, normal ROM Neurologic: Alert and oriented x 4.  GU: Neg CVAT.  PELVIC EXAM: Cervix pink, visually closed, without lesion, scant white creamy discharge, vaginal walls and external genitalia normal Bimanual exam: Cervix 0/long/high, firm, anterior, neg CMT, uterus nontender, nonenlarged, adnexa without tenderness, enlargement, or mass     FHT:  Baseline 135 , moderate variability, accelerations present, no decelerations Contractions: None on toco or to palpation   Labs: No results found for this or any previous visit (from the past 24 hour(s)). --/--/O POS (06/16 1258)  Imaging:  No results found.  MAU Course/MDM: I have ordered labs and reviewed results.  NST reviewed and reactive Pt feeling fetal movement while in MAU Consult Dr Claiborne Billingsallahan with presentation, exam findings and test results.  D/C home with fetal kick counting reviewed F/U in office as scheduled, return to MAU with signs of labor or emergencies   Assessment: 1. Decreased fetal movements in third trimester, single or unspecified fetus   2. NST (non-stress test) reactive     Plan: Discharge home Labor precautions and fetal kick counts  Allergies as of 01/27/2017      Reactions   Penicillins Swelling, Other (See Comments)   Reaction:  Arm/leg swelling  Has patient had a PCN reaction causing immediate rash, facial/tongue/throat swelling, SOB or lightheadedness with hypotension: Yes Has patient had a PCN reaction causing severe rash involving mucus membranes or skin necrosis: No Has  patient had a PCN reaction that required hospitalization: No Has patient had a PCN reaction occurring within the last 10 years: No If all of the above answers are "NO", then may proceed with Cephalosporin use.   Shrimp [shellfish Allergy] Swelling, Other (See Comments)   Localized swelling      Medication List    STOP taking these medications   ondansetron 4 MG disintegrating tablet Commonly known as:  ZOFRAN ODT     TAKE these medications   famotidine 20 MG tablet Commonly known as:  PEPCID Take 1 tablet (20 mg total) by mouth 2 (two) times daily.   Prenatal Adult Gummy/DHA/FA 0.4-25 MG Chew Chew 3 tablets by mouth daily.   promethazine 25 MG tablet Commonly known as:  PHENERGAN Take 0.5-1 tablets (12.5-25 mg total) by mouth every 6 (six) hours as needed. What changed:  reasons to take this       Sharen CounterLisa Leftwich-Kirby Certified Nurse-Midwife 01/27/2017 3:12 PM

## 2017-02-16 DIAGNOSIS — O411290 Chorioamnionitis, unspecified trimester, not applicable or unspecified: Secondary | ICD-10-CM

## 2017-02-16 HISTORY — DX: Chorioamnionitis, unspecified trimester, not applicable or unspecified: O41.1290

## 2017-02-16 NOTE — L&D Delivery Note (Addendum)
Patient was C/C/+2 and pushed for approx 1hr 40 min minutes with epidural.    NSVD female infant, Apgars pending, weight pending.   The patient had 2nd degree laceration repaired with 2-0 vicryl Fundus was firm. EBL was expected amount. Placenta was delivered intact. Vagina was clear.  Delayed cord clamping not done, thick meconium present with low tone, baby transferred to warmer and NICU came to assess.  Antibiotics for chorioamnionitis not yet started at time of delivery, patient to receive one dose postpartum Kelsey Black

## 2017-02-20 ENCOUNTER — Other Ambulatory Visit: Payer: Self-pay

## 2017-02-20 ENCOUNTER — Encounter (HOSPITAL_COMMUNITY): Payer: Self-pay

## 2017-02-20 ENCOUNTER — Inpatient Hospital Stay (HOSPITAL_COMMUNITY)
Admission: AD | Admit: 2017-02-20 | Discharge: 2017-02-20 | Disposition: A | Discharge status: Home / Self Care | Payer: Medicaid Other | Source: Ambulatory Visit | Attending: Obstetrics and Gynecology | Admitting: Obstetrics and Gynecology

## 2017-02-20 DIAGNOSIS — O26893 Other specified pregnancy related conditions, third trimester: Secondary | ICD-10-CM | POA: Insufficient documentation

## 2017-02-20 DIAGNOSIS — N898 Other specified noninflammatory disorders of vagina: Secondary | ICD-10-CM | POA: Diagnosis not present

## 2017-02-20 DIAGNOSIS — O2243 Hemorrhoids in pregnancy, third trimester: Secondary | ICD-10-CM

## 2017-02-20 DIAGNOSIS — Z880 Allergy status to penicillin: Secondary | ICD-10-CM | POA: Insufficient documentation

## 2017-02-20 DIAGNOSIS — M545 Low back pain: Secondary | ICD-10-CM | POA: Diagnosis present

## 2017-02-20 DIAGNOSIS — Z3A33 33 weeks gestation of pregnancy: Secondary | ICD-10-CM | POA: Insufficient documentation

## 2017-02-20 DIAGNOSIS — O2240 Hemorrhoids in pregnancy, unspecified trimester: Secondary | ICD-10-CM | POA: Insufficient documentation

## 2017-02-20 DIAGNOSIS — R109 Unspecified abdominal pain: Secondary | ICD-10-CM | POA: Diagnosis present

## 2017-02-20 LAB — URINALYSIS, ROUTINE W REFLEX MICROSCOPIC
BILIRUBIN URINE: NEGATIVE
GLUCOSE, UA: NEGATIVE mg/dL
Hgb urine dipstick: NEGATIVE
KETONES UR: NEGATIVE mg/dL
LEUKOCYTES UA: NEGATIVE
NITRITE: NEGATIVE
PH: 8 (ref 5.0–8.0)
Protein, ur: NEGATIVE mg/dL
SPECIFIC GRAVITY, URINE: 1.008 (ref 1.005–1.030)

## 2017-02-20 LAB — WET PREP, GENITAL
CLUE CELLS WET PREP: NONE SEEN
Sperm: NONE SEEN
Trich, Wet Prep: NONE SEEN
YEAST WET PREP: NONE SEEN

## 2017-02-20 NOTE — Discharge Instructions (Signed)

## 2017-02-20 NOTE — Progress Notes (Addendum)
G1 @ 33.[redacted] wksga. Presents to triage for "spotting red blood". Denies LOF. + FM. EFM applied  Provider at bs assessing. Wetprep, gc collected. Pelvic exam done.   1115: relinquished care over to RN Debra C.

## 2017-02-20 NOTE — MAU Provider Note (Signed)
History     CSN: [id number]  Arrival date and time: 02/20/17 2143   None     Chief Complaint  Patient presents with  . Back Pain  . Abdominal Pain  . Vaginal Bleeding   HPI   [name] is a 27 y.o. female G1P0000 @ [redacted]w[redacted]d presenting with vaginal spotting that started this morning. She saw it once this morning and then again this evening. The spotting is only noted when she wipes. She does not need to wear a pad. Some lower back pain that is not new. No recent intercourse. + diarrhea.   OB History    Gravida Para Term Preterm AB Living   1 0 0 0 0     SAB TAB Ectopic Multiple Live Births   0 0 0          Past Medical History:  Diagnosis Date  . Anemia   . Family history of adverse reaction to anesthesia    mother woke up during abdominal surgery 3 years ago  . Nausea & vomiting    last month  . Pregnancy    19 weeks and few days Kelsey Black ob dr  . Symptomatic cholelithiasis     Past Surgical History:  Procedure Laterality Date  . CHOLECYSTECTOMY N/A 11/16/2016   Procedure: LAPAROSCOPIC CHOLECYSTECTOMY;  Surgeon: Andria Meuse, MD;  Location: [facility];  Service: General;  Laterality: N/A;  . TOOTH EXTRACTION      Family History  Problem Relation Age of Onset  . Asthma Mother   . Asthma Sister   . Asthma Brother     Social History   Tobacco Use  . Smoking status: Never Smoker  . Smokeless tobacco: Never Used  Substance Use Topics  . Alcohol use: No    Alcohol/week: 0.0 oz  . Drug use: No    Allergies:  Allergies  Allergen Reactions  . Penicillins Swelling and Other (See Comments)    Reaction:  Arm/leg swelling  Has patient had a PCN reaction causing immediate rash, facial/tongue/throat swelling, SOB or lightheadedness with hypotension: Yes Has patient had a PCN reaction causing severe rash involving mucus membranes or skin necrosis: No Has patient had a PCN reaction that required hospitalization: No Has patient had a PCN  reaction occurring within the last 10 years: No If all of the above answers are "NO", then may proceed with Cephalosporin use.  . Shrimp [Shellfish Allergy] Swelling and Other (See Comments)    Localized swelling    Medications Prior to Admission  Medication Sig Dispense Refill Last Dose  . famotidine (PEPCID) 20 MG tablet Take 1 tablet (20 mg total) by mouth 2 (two) times daily. 60 tablet 5 01/26/2017 at Unknown time  . Prenatal MV & Min w/FA-DHA (PRENATAL ADULT GUMMY/DHA/FA) 0.4-25 MG CHEW Chew 3 tablets by mouth daily.   01/26/2017 at Unknown time  . promethazine (PHENERGAN) 25 MG tablet Take 0.5-1 tablets (12.5-25 mg total) by mouth every 6 (six) hours as needed. (Patient taking differently: Take 12.5-25 mg by mouth every 6 (six) hours as needed for nausea or vomiting. ) 30 tablet 2 Past Month at Unknown time   Results for orders placed or performed during the hospital encounter of 02/20/17 (from the past 48 hour(s))  Urinalysis, Routine w reflex microscopic     Status: Abnormal   Collection Time: 02/20/17  9:55 PM  Result Value Ref Range   Color, Urine STRAW (A) YELLOW   APPearance CLEAR CLEAR   Specific  Gravity, Urine 1.008 1.005 - 1.030   pH 8.0 5.0 - 8.0   Glucose, UA NEGATIVE NEGATIVE mg/dL   Hgb urine dipstick NEGATIVE NEGATIVE   Bilirubin Urine NEGATIVE NEGATIVE   Ketones, ur NEGATIVE NEGATIVE mg/dL   Protein, ur NEGATIVE NEGATIVE mg/dL   Nitrite NEGATIVE NEGATIVE   Leukocytes, UA NEGATIVE NEGATIVE  Wet prep, genital     Status: Abnormal   Collection Time: 02/20/17 10:22 PM  Result Value Ref Range   Yeast Wet Prep HPF POC NONE SEEN NONE SEEN   Trich, Wet Prep NONE SEEN NONE SEEN   Clue Cells Wet Prep HPF POC NONE SEEN NONE SEEN   WBC, Wet Prep HPF POC MODERATE (A) NONE SEEN    Comment: MODERATE BACTERIA SEEN   Sperm NONE SEEN    Review of Systems  Gastrointestinal: Positive for diarrhea. Negative for constipation.  Genitourinary: Negative for dysuria.   Physical  Exam   Blood pressure (!) 97/58, pulse 81, temperature 98.8 F (37.1 C), temperature source Oral, resp. rate 18, last menstrual period 05/26/2016, SpO2 100 %.  Physical Exam  Constitutional: She is oriented to person, place, and time. She appears well-developed and well-nourished. No distress.  HENT:  Head: Normocephalic.  Eyes: Pupils are equal, round, and reactive to light.  Respiratory: Effort normal.  GI: Soft. She exhibits no distension. There is no tenderness. There is no rebound.  Genitourinary: Rectal exam shows external hemorrhoid.  Musculoskeletal: Normal range of motion.  Neurological: She is alert and oriented to person, place, and time.  Skin: Skin is warm. She is not diaphoretic.  Psychiatric: Her behavior is normal.   Fetal Tracing: Baseline: 130 bpm Variability: Moderate  Accelerations: 15x15 Decelerations: None Toco: None  MAU Course  Procedures  None  MDM  O positive blood type  Discussed patient with Dr. Claiborne Billingsallahan at 1134 PM, [facility]. No blood noted on exam.  Assessment and Plan   A:  1. Vaginal discharge during pregnancy in third trimester   2. [redacted] weeks gestation of pregnancy   3. Hemorrhoids during pregnancy in third trimester     P:  Discharge home in stable condition Strict return precautions Follow up with Dr. Claiborne Billingsallahan as needed or as scheduled OTC hemorrhoid medication discussed  Kynzi Levay, Harolyn RutherfordJennifer I, NP 02/21/2017 12:54 AM

## 2017-02-20 NOTE — Progress Notes (Signed)
Jennifer Rasch NP in to discuss test results and d/c plan with pt. Written and verbal d/c instructions given and understanding voiced 

## 2017-02-22 LAB — GC/CHLAMYDIA PROBE AMP (~~LOC~~) NOT AT ARMC
CHLAMYDIA, DNA PROBE: NEGATIVE
Neisseria Gonorrhea: NEGATIVE

## 2017-03-05 LAB — OB RESULTS CONSOLE GBS: STREP GROUP B AG: NEGATIVE

## 2017-03-05 LAB — OB RESULTS CONSOLE GC/CHLAMYDIA
CHLAMYDIA, DNA PROBE: NEGATIVE
GC PROBE AMP, GENITAL: NEGATIVE

## 2017-03-17 ENCOUNTER — Other Ambulatory Visit: Payer: Self-pay

## 2017-03-17 ENCOUNTER — Encounter (HOSPITAL_COMMUNITY): Payer: Self-pay | Admitting: *Deleted

## 2017-03-17 ENCOUNTER — Inpatient Hospital Stay (HOSPITAL_COMMUNITY)
Admission: AD | Admit: 2017-03-17 | Discharge: 2017-03-17 | Disposition: A | Discharge status: Home / Self Care | Payer: Medicaid Other | Source: Ambulatory Visit | Attending: Obstetrics | Admitting: Obstetrics

## 2017-03-17 DIAGNOSIS — Z3A37 37 weeks gestation of pregnancy: Secondary | ICD-10-CM | POA: Insufficient documentation

## 2017-03-17 DIAGNOSIS — O26893 Other specified pregnancy related conditions, third trimester: Secondary | ICD-10-CM | POA: Insufficient documentation

## 2017-03-17 DIAGNOSIS — O9989 Other specified diseases and conditions complicating pregnancy, childbirth and the puerperium: Secondary | ICD-10-CM | POA: Diagnosis not present

## 2017-03-17 DIAGNOSIS — N898 Other specified noninflammatory disorders of vagina: Secondary | ICD-10-CM | POA: Diagnosis not present

## 2017-03-17 DIAGNOSIS — N888 Other specified noninflammatory disorders of cervix uteri: Secondary | ICD-10-CM

## 2017-03-17 DIAGNOSIS — Z0371 Encounter for suspected problem with amniotic cavity and membrane ruled out: Secondary | ICD-10-CM

## 2017-03-17 LAB — URINALYSIS, ROUTINE W REFLEX MICROSCOPIC
Bilirubin Urine: NEGATIVE
GLUCOSE, UA: NEGATIVE mg/dL
KETONES UR: NEGATIVE mg/dL
Nitrite: NEGATIVE
PROTEIN: NEGATIVE mg/dL
Specific Gravity, Urine: 1.012 (ref 1.005–1.030)
pH: 6 (ref 5.0–8.0)

## 2017-03-17 LAB — POCT FERN TEST: POCT FERN TEST: NEGATIVE

## 2017-03-17 NOTE — MAU Provider Note (Signed)
S: [name] is a 27 y.o. G1P0000 at 5933w3d  who presents to MAU today complaining of leaking of fluid since today.  She endorses vaginal bleeding. She endorses contractions. She reports normal fetal movement.    O: BP 115/60 (BP Location: Right Arm)   Pulse 95   Temp 98.5 F (36.9 C) (Oral)   Resp 18   Ht 5\' 4"  (1.626 m)   Wt 201 lb 4 oz (91.3 kg)   LMP 05/26/2016   BMI 34.54 kg/m  GENERAL: Well-developed, well-nourished female in no acute distress.  HEAD: Normocephalic, atraumatic.  CHEST: Normal effort of breathing, regular heart rate ABDOMEN: Soft, nontender, gravid PELVIC: Normal external female genitalia. Vagina is pink and rugated. Cervix with normal contour, no lesions. Normal discharge.  negative pooling. Mucus noted at cervix. Friable cervix after swab was collected.   Cervical exam:  Dilation: 1 Effacement (%): 50 Station: -3 Exam by:: Kamdin Follett, NP   Fetal Monitoring: Baseline: 125 bpm Variability: moderate  Accelerations: 15x15 Decelerations: none Contractions: quiet   Results for orders placed or performed during the hospital encounter of 03/17/17 (from the past 24 hour(s))  Urinalysis, Routine w reflex microscopic     Status: Abnormal   Collection Time: 03/17/17  7:25 PM  Result Value Ref Range   Color, Urine YELLOW YELLOW   APPearance HAZY (A) CLEAR   Specific Gravity, Urine 1.012 1.005 - 1.030   pH 6.0 5.0 - 8.0   Glucose, UA NEGATIVE NEGATIVE mg/dL   Hgb urine dipstick MODERATE (A) NEGATIVE   Bilirubin Urine NEGATIVE NEGATIVE   Ketones, ur NEGATIVE NEGATIVE mg/dL   Protein, ur NEGATIVE NEGATIVE mg/dL   Nitrite NEGATIVE NEGATIVE   Leukocytes, UA MODERATE (A) NEGATIVE   RBC / HPF 0-5 0 - 5 RBC/hpf   WBC, UA 6-30 0 - 5 WBC/hpf   Bacteria, UA RARE (A) NONE SEEN   Squamous Epithelial / LPF 6-30 (A) NONE SEEN   Mucus PRESENT   Fern Test     Status: Normal   Collection Time: 03/17/17  7:54 PM  Result Value Ref Range   POCT Fern Test  Negative = intact amniotic membranes      A: SIUP at 4333w3d  Membranes intact  P:  Discussed exam with Dr. Ramiro Harvestlark Ok for DC home Bleeding precautions Return to MAU if symptoms worsen    Kelsey Black, Kelsey DikeJennifer I, NP 03/17/2017 8:16 PM

## 2017-03-17 NOTE — MAU Note (Signed)
Pt here due to vaginal bleeding started around 1730 today. Positive for fetal movement, denies sudden gush of fluid, but there is vaginal leaking. EFM applied - FHR 130s, Toco applied - abd. Soft.

## 2017-03-17 NOTE — MAU Note (Signed)
Urine is in the Lab 

## 2017-03-17 NOTE — MAU Note (Signed)
MAU provider Suzette Battiest(Veronica, CNM) notified, pt. ready to be seen (Dr. Chestine Sporelark would like MAU provider to perform speculum exam.)

## 2017-03-17 NOTE — Discharge Instructions (Signed)

## 2017-03-17 NOTE — MAU Note (Signed)
Fern collected by provider via speculum - Fern negative.

## 2017-03-21 ENCOUNTER — Other Ambulatory Visit: Payer: Self-pay

## 2017-03-21 ENCOUNTER — Inpatient Hospital Stay (HOSPITAL_COMMUNITY)
Admission: AD | Admit: 2017-03-21 | Discharge: 2017-03-21 | Disposition: A | Discharge status: Home / Self Care | Payer: Medicaid Other | Source: Ambulatory Visit | Attending: Obstetrics and Gynecology | Admitting: Obstetrics and Gynecology

## 2017-03-21 ENCOUNTER — Encounter (HOSPITAL_COMMUNITY): Payer: Self-pay | Admitting: *Deleted

## 2017-03-21 DIAGNOSIS — O479 False labor, unspecified: Secondary | ICD-10-CM

## 2017-03-21 LAB — URINALYSIS, ROUTINE W REFLEX MICROSCOPIC
Bilirubin Urine: NEGATIVE
Glucose, UA: NEGATIVE mg/dL
Ketones, ur: NEGATIVE mg/dL
Leukocytes, UA: NEGATIVE
NITRITE: NEGATIVE
PROTEIN: NEGATIVE mg/dL
Specific Gravity, Urine: 1.01 (ref 1.005–1.030)
pH: 7 (ref 5.0–8.0)

## 2017-03-21 NOTE — MAU Note (Signed)
I have communicated with Dr. Dareen PianoAnderson and reviewed vital signs:  Vitals:   03/21/17 0819  BP: 106/66  Pulse: 91  Resp: 20  Temp: 98.1 F (36.7 C)  SpO2: 99%    Vaginal exam:  Dilation: 3 Effacement (%): 50 Station: -3 Presentation: Vertex Exam by:: F. Haig Gerardo, RNC,   Also reviewed contraction pattern and that non-stress test is reactive.  It has been documented that patient is contracting every 4-6 minutes with a cervical exam  not indicating active labor.  Patient denies any other complaints.  Based on this report provider has given order for discharge.  A discharge order and diagnosis entered by a provider.   Labor discharge instructions reviewed with patient.

## 2017-03-21 NOTE — MAU Note (Signed)
Pt presents with ctxs that began @ 0500 this morning.  Reports presence of bloody show, no VB.  Denies LOF.  Reports unsure if any FM today.

## 2017-03-21 NOTE — Discharge Instructions (Signed)
Fetal Movement Counts °Patient Name: ________________________________________________ Patient Due Date: ____________________ °What is a fetal movement count? °A fetal movement count is the number of times that you feel your baby move during a certain amount of time. This may also be called a fetal kick count. A fetal movement count is recommended for every pregnant woman. You may be asked to start counting fetal movements as early as week 28 of your pregnancy. °Pay attention to when your baby is most active. You may notice your baby's sleep and wake cycles. You may also notice things that make your baby move more. You should do a fetal movement count: °· When your baby is normally most active. °· At the same time each day. ° °A good time to count movements is while you are resting, after having something to eat and drink. °How do I count fetal movements? °1. Find a quiet, comfortable area. Sit, or lie down on your side. °2. Write down the date, the start time and stop time, and the number of movements that you felt between those two times. Take this information with you to your health care visits. °3. For 2 hours, count kicks, flutters, swishes, rolls, and jabs. You should feel at least 10 movements during 2 hours. °4. You may stop counting after you have felt 10 movements. °5. If you do not feel 10 movements in 2 hours, have something to eat and drink. Then, keep resting and counting for 1 hour. If you feel at least 4 movements during that hour, you may stop counting. °Contact a health care provider if: °· You feel fewer than 4 movements in 2 hours. °· Your baby is not moving like he or she usually does. °Date: ____________ Start time: ____________ Stop time: ____________ Movements: ____________ °Date: ____________ Start time: ____________ Stop time: ____________ Movements: ____________ °Date: ____________ Start time: ____________ Stop time: ____________ Movements: ____________ °Date: ____________ Start time:  ____________ Stop time: ____________ Movements: ____________ °Date: ____________ Start time: ____________ Stop time: ____________ Movements: ____________ °Date: ____________ Start time: ____________ Stop time: ____________ Movements: ____________ °Date: ____________ Start time: ____________ Stop time: ____________ Movements: ____________ °Date: ____________ Start time: ____________ Stop time: ____________ Movements: ____________ °Date: ____________ Start time: ____________ Stop time: ____________ Movements: ____________ °This information is not intended to replace advice given to you by your health care provider. Make sure you discuss any questions you have with your health care provider. °Document Released: 03/04/2006 Document Revised: 10/02/2015 Document Reviewed: 03/14/2015 °Elsevier Interactive Patient Education © 2018 [employer] °Braxton Hicks Contractions °Contractions of the uterus can occur throughout pregnancy, but they are not always a sign that you are in labor. You may have practice contractions called Braxton Hicks contractions. These false labor contractions are sometimes confused with true labor. °What are Braxton Hicks contractions? °Braxton Hicks contractions are tightening movements that occur in the muscles of the uterus before labor. Unlike true labor contractions, these contractions do not result in opening (dilation) and thinning of the cervix. Toward the end of pregnancy (32-34 weeks), Braxton Hicks contractions can happen more often and may become stronger. These contractions are sometimes difficult to tell apart from true labor because they can be very uncomfortable. You should not feel embarrassed if you go to the hospital with false labor. °Sometimes, the only way to tell if you are in true labor is for your health care provider to look for changes in the cervix. The health care provider will do a physical exam and may monitor your contractions. If   you are not in true labor, the exam  should show that your cervix is not dilating and your water has not broken. °If there are other health problems associated with your pregnancy, it is completely safe for you to be sent home with false labor. You may continue to have Braxton Hicks contractions until you go into true labor. °How to tell the difference between true labor and false labor °True labor °· Contractions last 30-70 seconds. °· Contractions become very regular. °· Discomfort is usually felt in the top of the uterus, and it spreads to the lower abdomen and low back. °· Contractions do not go away with walking. °· Contractions usually become more intense and increase in frequency. °· The cervix dilates and gets thinner. °False labor °· Contractions are usually shorter and not as strong as true labor contractions. °· Contractions are usually irregular. °· Contractions are often felt in the front of the lower abdomen and in the groin. °· Contractions may go away when you walk around or change positions while lying down. °· Contractions get weaker and are shorter-lasting as time goes on. °· The cervix usually does not dilate or become thin. °Follow these instructions at home: °· Take over-the-counter and prescription medicines only as told by your health care provider. °· Keep up with your usual exercises and follow other instructions from your health care provider. °· Eat and drink lightly if you think you are going into labor. °· If Braxton Hicks contractions are making you uncomfortable: °? Change your position from lying down or resting to walking, or change from walking to resting. °? Sit and rest in a tub of warm water. °? Drink enough fluid to keep your urine pale yellow. Dehydration may cause these contractions. °? Do slow and deep breathing several times an hour. °· Keep all follow-up prenatal visits as told by your health care provider. This is important. °Contact a health care provider if: °· You have a fever. °· You have continuous pain  in your abdomen. °Get help right away if: °· Your contractions become stronger, more regular, and closer together. °· You have fluid leaking or gushing from your vagina. °· You pass blood-tinged mucus (bloody show). °· You have bleeding from your vagina. °· You have low back pain that you never had before. °· You feel your baby’s head pushing down and causing pelvic pressure. °· Your baby is not moving inside you as much as it used to. °Summary °· Contractions that occur before labor are called Braxton Hicks contractions, false labor, or practice contractions. °· Braxton Hicks contractions are usually shorter, weaker, farther apart, and less regular than true labor contractions. True labor contractions usually become progressively stronger and regular and they become more frequent. °· Manage discomfort from Braxton Hicks contractions by changing position, resting in a warm bath, drinking plenty of water, or practicing deep breathing. °This information is not intended to replace advice given to you by your health care provider. Make sure you discuss any questions you have with your health care provider. °Document Released: 06/18/2016 Document Revised: 06/18/2016 Document Reviewed: 06/18/2016 °Elsevier Interactive Patient Education © 2018 [employer] °Vaginal Delivery °Vaginal delivery means that you will give birth by pushing your baby out of your birth canal (vagina). A team of health care providers will help you before, during, and after vaginal delivery. Birth experiences are unique for every woman and every pregnancy, and birth experiences vary depending on where you choose to give birth. °What should I do   to prepare for my baby's birth? °Before your baby is born, it is important to talk with your health care provider about: °· Your labor and delivery preferences. These may include: °? Medicines that you may be given. °? How you will manage your pain. This might include non-medical pain relief techniques or  injectable pain relief such as epidural analgesia. °? How you and your baby will be monitored during labor and delivery. °? Who may be in the labor and delivery room with you. °? Your feelings about surgical delivery of your baby (cesarean delivery, or C-section) if this becomes necessary. °? Your feelings about receiving donated blood through an IV tube (blood transfusion) if this becomes necessary. °· Whether you are able: °? To take pictures or videos of the birth. °? To eat during labor and delivery. °? To move around, walk, or change positions during labor and delivery. °· What to expect after your baby is born, such as: °? Whether delayed umbilical cord clamping and cutting is offered. °? Who will care for your baby right after birth. °? Medicines or tests that may be recommended for your baby. °? Whether breastfeeding is supported in your hospital or birth center. °? How long you will be in the hospital or birth center. °· How any medical conditions you have may affect your baby or your labor and delivery experience. ° °To prepare for your baby's birth, you should also: °· Attend all of your health care visits before delivery (prenatal visits) as recommended by your health care provider. This is important. °· Prepare your home for your baby's arrival. Make sure that you have: °? Diapers. °? Baby clothing. °? Feeding equipment. °? Safe sleeping arrangements for you and your baby. °· Install a car seat in your vehicle. Have your car seat checked by a certified car seat installer to make sure that it is installed safely. °· Think about who will help you with your new baby at home for at least the first several weeks after delivery. ° °What can I expect when I arrive at the birth center or hospital? °Once you are in labor and have been admitted into the hospital or birth center, your health care provider may: °· Review your pregnancy history and any concerns you have. °· Insert an IV tube into one of your veins.  This is used to give you fluids and medicines. °· Check your blood pressure, pulse, temperature, and heart rate (vital signs). °· Check whether your bag of water (amniotic sac) has broken (ruptured). °· Talk with you about your birth plan and discuss pain control options. ° °Monitoring °Your health care provider may monitor your contractions (uterine monitoring) and your baby's heart rate (fetal monitoring). You may need to be monitored: °· Often, but not continuously (intermittently). °· All the time or for long periods at a time (continuously). Continuous monitoring may be needed if: °? You are taking certain medicines, such as medicine to relieve pain or make your contractions stronger. °? You have pregnancy or labor complications. ° °Monitoring may be done by: °· Placing a special stethoscope or a handheld monitoring device on your abdomen to check your baby's heartbeat, and feeling your abdomen for contractions. This method of monitoring does not continuously record your baby's heartbeat or your contractions. °· Placing monitors on your abdomen (external monitors) to record your baby's heartbeat and the frequency and length of contractions. You may not have to wear external monitors all the time. °· Placing monitors inside of   your uterus (internal monitors) to record your baby's heartbeat and the frequency, length, and strength of your contractions. °? Your health care provider may use internal monitors if he or she needs more information about the strength of your contractions or your baby's heart rate. °? Internal monitors are put in place by passing a thin, flexible wire through your vagina and into your uterus. Depending on the type of monitor, it may remain in your uterus or on your baby's head until birth. °? Your health care provider will discuss the benefits and risks of internal monitoring with you and will ask for your permission before inserting the monitors. °· Telemetry. This is a type of  continuous monitoring that can be done with external or internal monitors. Instead of having to stay in bed, you are able to move around during telemetry. Ask your health care provider if telemetry is an option for you. ° °Physical exam °Your health care provider may perform a physical exam. This may include: °· Checking whether your baby is positioned: °? With the head toward your vagina (head-down). This is most common. °? With the head toward the top of your uterus (head-up or breech). If your baby is in a breech position, your health care provider may try to turn your baby to a head-down position so you can deliver vaginally. If it does not seem that your baby can be born vaginally, your provider may recommend surgery to deliver your baby. In rare cases, you may be able to deliver vaginally if your baby is head-up (breech delivery). °? Lying sideways (transverse). Babies that are lying sideways cannot be delivered vaginally. °· Checking your cervix to determine: °? Whether it is thinning out (effacing). °? Whether it is opening up (dilating). °? How low your baby has moved into your birth canal. ° °What are the three stages of labor and delivery? ° °Normal labor and delivery is divided into the following three stages: °Stage 1 °· Stage 1 is the longest stage of labor, and it can last for hours or days. Stage 1 includes: °? Early labor. This is when contractions may be irregular, or regular and mild. Generally, early labor contractions are more than 10 minutes apart. °? Active labor. This is when contractions get longer, more regular, more frequent, and more intense. °? The transition phase. This is when contractions happen very close together, are very intense, and may last longer than during any other part of labor. °· Contractions generally feel mild, infrequent, and irregular at first. They get stronger, more frequent (about every 2-3 minutes), and more regular as you progress from early labor through active  labor and transition. °· Many women progress through stage 1 naturally, but you may need help to continue making progress. If this happens, your health care provider may talk with you about: °? Rupturing your amniotic sac if it has not ruptured yet. °? Giving you medicine to help make your contractions stronger and more frequent. °· Stage 1 ends when your cervix is completely dilated to 4 inches (10 cm) and completely effaced. This happens at the end of the transition phase. °Stage 2 °· Once your cervix is completely effaced and dilated to 4 inches (10 cm), you may start to feel an urge to push. It is common for the body to naturally take a rest before feeling the urge to push, especially if you received an epidural or certain other pain medicines. This rest period may last for up to 1-2 hours, depending   on your unique labor experience. °· During stage 2, contractions are generally less painful, because pushing helps relieve contraction pain. Instead of contraction pain, you may feel stretching and burning pain, especially when the widest part of your baby's head passes through the vaginal opening (crowning). °· Your health care provider will closely monitor your pushing progress and your baby's progress through the vagina during stage 2. °· Your health care provider may massage the area of skin between your vaginal opening and anus (perineum) or apply warm compresses to your perineum. This helps it stretch as the baby's head starts to crown, which can help prevent perineal tearing. °? In some cases, an incision may be made in your perineum (episiotomy) to allow the baby to pass through the vaginal opening. An episiotomy helps to make the opening of the vagina larger to allow more room for the baby to fit through. °· It is very important to breathe and focus so your health care provider can control the delivery of your baby's head. Your health care provider may have you decrease the intensity of your pushing, to  help prevent perineal tearing. °· After delivery of your baby's head, the shoulders and the rest of the body generally deliver very quickly and without difficulty. °· Once your baby is delivered, the umbilical cord may be cut right away, or this may be delayed for 1-2 minutes, depending on your baby's health. This may vary among health care providers, hospitals, and birth centers. °· If you and your baby are healthy enough, your baby may be placed on your chest or abdomen to help maintain the baby's temperature and to help you bond with each other. Some mothers and babies start breastfeeding at this time. Your health care team will dry your baby and help keep your baby warm during this time. °· Your baby may need immediate care if he or she: °? Showed signs of distress during labor. °? Has a medical condition. °? Was born too early (prematurely). °? Had a bowel movement before birth (meconium). °? Shows signs of difficulty transitioning from being inside the uterus to being outside of the uterus. °If you are planning to breastfeed, your health care team will help you begin a feeding. °Stage 3 °· The third stage of labor starts immediately after the birth of your baby and ends after you deliver the placenta. The placenta is an organ that develops during pregnancy to provide oxygen and nutrients to your baby in the womb. °· Delivering the placenta may require some pushing, and you may have mild contractions. Breastfeeding can stimulate contractions to help you deliver the placenta. °· After the placenta is delivered, your uterus should tighten (contract) and become firm. This helps to stop bleeding in your uterus. To help your uterus contract and to control bleeding, your health care provider may: °? Give you medicine by injection, through an IV tube, by mouth, or through your rectum (rectally). °? Massage your abdomen or perform a vaginal exam to remove any blood clots that are left in your uterus. °? Empty your  bladder by placing a thin, flexible tube (catheter) into your bladder. °? Encourage you to breastfeed your baby. °After labor is over, you and your baby will be monitored closely to ensure that you are both healthy until you are ready to go home. Your health care team will teach you how to care for yourself and your baby. °This information is not intended to replace advice given to you by your health   care provider. Make sure you discuss any questions you have with your health care provider. °Document Released: 11/12/2007 Document Revised: 08/23/2015 Document Reviewed: 02/17/2015 °Elsevier Interactive Patient Education © 2018 [employer] ° °

## 2017-03-22 ENCOUNTER — Inpatient Hospital Stay (HOSPITAL_COMMUNITY): Payer: Medicaid Other | Admitting: Anesthesiology

## 2017-03-22 ENCOUNTER — Inpatient Hospital Stay (HOSPITAL_COMMUNITY)
Admission: AD | Admit: 2017-03-22 | Discharge: 2017-03-24 | DRG: 805 | Disposition: A | Discharge status: Home / Self Care | Payer: Medicaid Other | Source: Ambulatory Visit | Attending: Nurse Practitioner | Admitting: Nurse Practitioner

## 2017-03-22 ENCOUNTER — Encounter (HOSPITAL_COMMUNITY): Payer: Self-pay | Admitting: *Deleted

## 2017-03-22 DIAGNOSIS — Z3A38 38 weeks gestation of pregnancy: Secondary | ICD-10-CM | POA: Diagnosis not present

## 2017-03-22 DIAGNOSIS — Z3483 Encounter for supervision of other normal pregnancy, third trimester: Secondary | ICD-10-CM | POA: Diagnosis present

## 2017-03-22 DIAGNOSIS — O411230 Chorioamnionitis, third trimester, not applicable or unspecified: Secondary | ICD-10-CM | POA: Diagnosis present

## 2017-03-22 DIAGNOSIS — O9902 Anemia complicating childbirth: Secondary | ICD-10-CM | POA: Diagnosis present

## 2017-03-22 DIAGNOSIS — D649 Anemia, unspecified: Secondary | ICD-10-CM | POA: Diagnosis present

## 2017-03-22 DIAGNOSIS — Z880 Allergy status to penicillin: Secondary | ICD-10-CM

## 2017-03-22 LAB — POCT FERN TEST
POCT Fern Test: NEGATIVE
POCT Fern Test: POSITIVE

## 2017-03-22 LAB — TYPE AND SCREEN
ABO/RH(D): O POS
ANTIBODY SCREEN: NEGATIVE

## 2017-03-22 LAB — CBC
HCT: 33.4 % — ABNORMAL LOW (ref 36.0–46.0)
HEMOGLOBIN: 10.6 g/dL — AB (ref 12.0–15.0)
MCH: 25.5 pg — ABNORMAL LOW (ref 26.0–34.0)
MCHC: 31.7 g/dL (ref 30.0–36.0)
MCV: 80.3 fL (ref 78.0–100.0)
Platelets: 289 10*3/uL (ref 150–400)
RBC: 4.16 MIL/uL (ref 3.87–5.11)
RDW: 18.9 % — AB (ref 11.5–15.5)
WBC: 14.1 10*3/uL — AB (ref 4.0–10.5)

## 2017-03-22 LAB — RPR: RPR Ser Ql: NONREACTIVE

## 2017-03-22 MED ORDER — TERBUTALINE SULFATE 1 MG/ML IJ SOLN
0.2500 mg | Freq: Once | INTRAMUSCULAR | Status: DC | PRN
  Filled 2017-03-22: qty 1

## 2017-03-22 MED ORDER — CLINDAMYCIN PHOSPHATE 900 MG/50ML IV SOLN: 900.0000 mg | Freq: Four times a day (QID) | INTRAVENOUS | Status: DC

## 2017-03-22 MED ORDER — OXYCODONE-ACETAMINOPHEN 5-325 MG PO TABS
1.0000 | ORAL_TABLET | ORAL | Status: DC | PRN
  Administered 2017-03-23: 1 via ORAL
  Filled 2017-03-22: qty 1

## 2017-03-22 MED ORDER — DIPHENHYDRAMINE HCL 25 MG PO CAPS: 25.0000 mg | ORAL_CAPSULE | Freq: Four times a day (QID) | ORAL | Status: DC | PRN

## 2017-03-22 MED ORDER — LACTATED RINGERS IV SOLN
500.0000 mL | Freq: Once | INTRAVENOUS | Status: AC
  Administered 2017-03-22: 1000 mL via INTRAVENOUS

## 2017-03-22 MED ORDER — BENZOCAINE-MENTHOL 20-0.5 % EX AERO: 1.0000 "application " | INHALATION_SPRAY | CUTANEOUS | Status: DC | PRN

## 2017-03-22 MED ORDER — ONDANSETRON HCL 4 MG PO TABS: 4.0000 mg | ORAL_TABLET | ORAL | Status: DC | PRN

## 2017-03-22 MED ORDER — OXYTOCIN BOLUS FROM INFUSION
500.0000 mL | Freq: Once | INTRAVENOUS | Status: AC
  Administered 2017-03-22: 500 mL via INTRAVENOUS

## 2017-03-22 MED ORDER — EPHEDRINE 5 MG/ML INJ
10.0000 mg | INTRAVENOUS | Status: DC | PRN
  Filled 2017-03-22: qty 2

## 2017-03-22 MED ORDER — LACTATED RINGERS IV SOLN
500.0000 mL | INTRAVENOUS | Status: DC | PRN
  Administered 2017-03-22: 1000 mL via INTRAVENOUS

## 2017-03-22 MED ORDER — OXYCODONE-ACETAMINOPHEN 5-325 MG PO TABS: 2.0000 | ORAL_TABLET | ORAL | Status: DC | PRN

## 2017-03-22 MED ORDER — COCONUT OIL OIL
1.0000 | TOPICAL_OIL | Status: DC | PRN
Start: 2017-03-22 — End: 2017-03-24

## 2017-03-22 MED ORDER — PHENYLEPHRINE 40 MCG/ML (10ML) SYRINGE FOR IV PUSH (FOR BLOOD PRESSURE SUPPORT)
80.0000 ug | PREFILLED_SYRINGE | INTRAVENOUS | Status: DC | PRN
  Filled 2017-03-22: qty 5

## 2017-03-22 MED ORDER — OXYTOCIN 40 UNITS IN LACTATED RINGERS INFUSION - SIMPLE MED
1.0000 m[IU]/min | INTRAVENOUS | Status: DC
  Administered 2017-03-22: 2 m[IU]/min via INTRAVENOUS

## 2017-03-22 MED ORDER — TETANUS-DIPHTH-ACELL PERTUSSIS 5-2.5-18.5 LF-MCG/0.5 IM SUSP: 0.5000 mL | Freq: Once | INTRAMUSCULAR | Status: DC

## 2017-03-22 MED ORDER — ACETAMINOPHEN 325 MG PO TABS: 650.0000 mg | ORAL_TABLET | ORAL | Status: DC | PRN

## 2017-03-22 MED ORDER — FLEET ENEMA 7-19 GM/118ML RE ENEM: 1.0000 | ENEMA | RECTAL | Status: DC | PRN

## 2017-03-22 MED ORDER — IBUPROFEN 600 MG PO TABS
600.0000 mg | ORAL_TABLET | Freq: Four times a day (QID) | ORAL | Status: DC
  Administered 2017-03-23: 600 mg via ORAL
  Filled 2017-03-22: qty 1

## 2017-03-22 MED ORDER — WITCH HAZEL-GLYCERIN EX PADS: 1.0000 "application " | MEDICATED_PAD | CUTANEOUS | Status: DC | PRN

## 2017-03-22 MED ORDER — PRENATAL MULTIVITAMIN CH
1.0000 | ORAL_TABLET | Freq: Every day | ORAL | Status: DC
  Filled 2017-03-22: qty 1

## 2017-03-22 MED ORDER — OXYTOCIN 40 UNITS IN LACTATED RINGERS INFUSION - SIMPLE MED
2.5000 [IU]/h | INTRAVENOUS | Status: DC
  Administered 2017-03-22: 2.5 [IU]/h via INTRAVENOUS
  Filled 2017-03-22: qty 1000

## 2017-03-22 MED ORDER — PHENYLEPHRINE 40 MCG/ML (10ML) SYRINGE FOR IV PUSH (FOR BLOOD PRESSURE SUPPORT)
80.0000 ug | PREFILLED_SYRINGE | INTRAVENOUS | Status: DC | PRN
  Filled 2017-03-22: qty 10
  Filled 2017-03-22: qty 5

## 2017-03-22 MED ORDER — DIBUCAINE 1 % RE OINT: 1.0000 "application " | TOPICAL_OINTMENT | RECTAL | Status: DC | PRN

## 2017-03-22 MED ORDER — SENNOSIDES-DOCUSATE SODIUM 8.6-50 MG PO TABS
2.0000 | ORAL_TABLET | ORAL | Status: DC
  Administered 2017-03-23 (×2): 2 via ORAL
  Filled 2017-03-22 (×2): qty 2

## 2017-03-22 MED ORDER — ZOLPIDEM TARTRATE 5 MG PO TABS
5.0000 mg | ORAL_TABLET | Freq: Every evening | ORAL | Status: DC | PRN
Start: 2017-03-22 — End: 2017-03-24

## 2017-03-22 MED ORDER — GENTAMICIN SULFATE 40 MG/ML IJ SOLN
Freq: Once | INTRAVENOUS | Status: AC
  Administered 2017-03-22: 22:00:00 via INTRAVENOUS
  Filled 2017-03-22: qty 4

## 2017-03-22 MED ORDER — FENTANYL 2.5 MCG/ML BUPIVACAINE 1/10 % EPIDURAL INFUSION (WH - ANES)
14.0000 mL/h | INTRAMUSCULAR | Status: DC | PRN
  Administered 2017-03-22 (×2): 14 mL/h via EPIDURAL
  Filled 2017-03-22 (×2): qty 100

## 2017-03-22 MED ORDER — LIDOCAINE HCL (PF) 1 % IJ SOLN
30.0000 mL | INTRAMUSCULAR | Status: DC | PRN
  Administered 2017-03-22: 30 mL via SUBCUTANEOUS
  Filled 2017-03-22: qty 30

## 2017-03-22 MED ORDER — SIMETHICONE 80 MG PO CHEW: 80.0000 mg | CHEWABLE_TABLET | ORAL | Status: DC | PRN

## 2017-03-22 MED ORDER — LACTATED RINGERS IV SOLN
INTRAVENOUS | Status: DC
  Administered 2017-03-22 (×3): via INTRAVENOUS

## 2017-03-22 MED ORDER — SOD CITRATE-CITRIC ACID 500-334 MG/5ML PO SOLN: 30.0000 mL | ORAL | Status: DC | PRN

## 2017-03-22 MED ORDER — LIDOCAINE HCL (PF) 1 % IJ SOLN
INTRAMUSCULAR | Status: DC | PRN
  Administered 2017-03-22: 8 mL via EPIDURAL
  Administered 2017-03-22: 4 mL via EPIDURAL

## 2017-03-22 MED ORDER — OXYCODONE-ACETAMINOPHEN 5-325 MG PO TABS
2.0000 | ORAL_TABLET | ORAL | Status: DC | PRN
  Administered 2017-03-23 (×2): 2 via ORAL
  Filled 2017-03-22 (×2): qty 2

## 2017-03-22 MED ORDER — ACETAMINOPHEN 500 MG PO TABS
1000.0000 mg | ORAL_TABLET | Freq: Four times a day (QID) | ORAL | Status: DC | PRN
  Administered 2017-03-22: 1000 mg via ORAL
  Filled 2017-03-22: qty 2

## 2017-03-22 MED ORDER — ONDANSETRON HCL 4 MG/2ML IJ SOLN: 4.0000 mg | INTRAMUSCULAR | Status: DC | PRN

## 2017-03-22 MED ORDER — ONDANSETRON HCL 4 MG/2ML IJ SOLN: 4.0000 mg | Freq: Four times a day (QID) | INTRAMUSCULAR | Status: DC | PRN

## 2017-03-22 MED ORDER — DIPHENHYDRAMINE HCL 50 MG/ML IJ SOLN: 12.5000 mg | INTRAMUSCULAR | Status: DC | PRN

## 2017-03-22 MED ORDER — OXYCODONE-ACETAMINOPHEN 5-325 MG PO TABS: 1.0000 | ORAL_TABLET | ORAL | Status: DC | PRN

## 2017-03-22 NOTE — MAU Note (Signed)
Pt presents to MAU c/o ctxs that are every 3-5 mins apart. Pt states she's had some bleeding that she noticed when she wiped. Pt states the baby has not moved since 2/3 @2100 . FHR 126 upon EFM placement. Pt reports a HA. Pt unsure if water is broken.   BP: 120/70 Pulse: 93 SPO2: 100 RA  Temp: 98.7 Wt: 193.12

## 2017-03-22 NOTE — Progress Notes (Signed)
Pharmacy Antibiotic Note  Kelsey BryantKatherine Mercado Black is a 27 y.o. female admitted on 03/22/2017 with contraction and SROM at 38 weeks.  Pharmacy has been consulted for Gentamicin dosing for chorioamniontis (Triple I).  Plan: Gentamicin 160mg  IV q8h Will continue to follow and assess need for further labs   Height: 5\' 4"  (162.6 cm) Weight: 193 lb 1.9 oz (87.6 kg) IBW/kg (Calculated) : 54.7  Adjusted/ Dosing BW: 64.6kg  Temp (24hrs), Avg:98.5 F (36.9 C), Min:97.6 F (36.4 C), Max:99.1 F (37.3 C)  Recent Labs  Lab 03/22/17 0605  WBC 14.1*    CrCl cannot be calculated (Patient's most recent lab result is older than the maximum 21 days allowed.).    Allergies  Allergen Reactions  . Penicillins Swelling and Other (See Comments)    Reaction:  Arm/leg swelling  Has patient had a PCN reaction causing immediate rash, facial/tongue/throat swelling, SOB or lightheadedness with hypotension: Yes Has patient had a PCN reaction causing severe rash involving mucus membranes or skin necrosis: No Has patient had a PCN reaction that required hospitalization: No Has patient had a PCN reaction occurring within the last 10 years: No If all of the above answers are "NO", then may proceed with Cephalosporin use.  . Shrimp [Shellfish Allergy] Swelling and Other (See Comments)    Localized swelling    Antimicrobials this admission: Clindamycin 900mg  IV q8h ( Pt has Penicillin allergy that involves swelling)    Thank you for allowing pharmacy to be a part of this patient's care.  Claybon Jabsngel, Panagiotis Oelkers G 03/22/2017 8:27 PM

## 2017-03-22 NOTE — Progress Notes (Signed)
Saw patient approx 4pm, doing well, comfortable. Progressing on pitocin, prior to arrival variable decelerations present that essentially resolved with position change. Spoke with RN at 6pm for update.  Pt was rechecked and 9cm.  Some variable decelerations, asked nurse to readjust toco to ensure proper tracing and to call with any late decelerations/ nonreassuring strip.  Advised RN to recheck 1hr.

## 2017-03-22 NOTE — MAU Provider Note (Signed)
S: Ms. Jefm BryantKatherine Mercado Colon is a 27 y.o. G1P0000 at 8087w1d  who presents to MAU today complaining of leaking of fluid intermittently x 1 week but with fluid leaking with each contraction today. Contractions started this morning at 9 am and have gotten progressively stronger and closer together all day.  She reports seeing light red when wiping since this morning. She last felt the baby moved at 9 pm yesterday but reports she was hurting all day so probably not focusing on movement. She is feeling movement in MAU.  She endorses vaginal bleeding. She endorses contractions.   O: BP 120/70 (BP Location: Left Arm)   Pulse 82   Temp 98.7 F (37.1 C) (Oral)   Resp 18   Ht 5\' 4"  (1.626 m)   Wt 193 lb 1.9 oz (87.6 kg)   LMP 05/26/2016   BMI 33.15 kg/m  GENERAL: Well-developed, well-nourished female in no acute distress.  HEAD: Normocephalic, atraumatic.  CHEST: Normal effort of breathing, regular heart rate ABDOMEN: Soft, nontender, gravid PELVIC: Normal external female genitalia. Vagina is pink and rugated. Cervix with normal contour, no lesions. Mucus discharge with pink tinge.  negative pooling.   Cervical exam:  Dilation: 4 Effacement (%): 80 Station: -3 Presentation: Vertex Exam by:: Arbie CookeyLisa Leftwhich Kirby CNM    Fetal Monitoring: Baseline: 125 Variability: moderate Accelerations: present Decelerations: isolated variable x 1 Contractions: Every 5 minutes by palpation, none on toco  Results for orders placed or performed during the hospital encounter of 03/21/17 (from the past 24 hour(s))  Urinalysis, Routine w reflex microscopic     Status: Abnormal   Collection Time: 03/21/17  8:25 AM  Result Value Ref Range   Color, Urine YELLOW YELLOW   APPearance CLEAR CLEAR   Specific Gravity, Urine 1.010 1.005 - 1.030   pH 7.0 5.0 - 8.0   Glucose, UA NEGATIVE NEGATIVE mg/dL   Hgb urine dipstick SMALL (A) NEGATIVE   Bilirubin Urine NEGATIVE NEGATIVE   Ketones, ur NEGATIVE NEGATIVE mg/dL    Protein, ur NEGATIVE NEGATIVE mg/dL   Nitrite NEGATIVE NEGATIVE   Leukocytes, UA NEGATIVE NEGATIVE   RBC / HPF 0-5 0 - 5 RBC/hpf   WBC, UA 0-5 0 - 5 WBC/hpf   Bacteria, UA RARE (A) NONE SEEN   Squamous Epithelial / LPF 0-5 (A) NONE SEEN   Mucus PRESENT      A: SIUP at 6487w1d  Membranes intact  P: Report given to RN to contact MD on call for further instructions  Hurshel PartyLeftwich-Kirby, Akia Desroches A, CNM 03/22/2017 3:28 AM

## 2017-03-22 NOTE — Anesthesia Preprocedure Evaluation (Signed)
Anesthesia Evaluation  Patient identified by MRN, date of birth, ID band Patient awake    Reviewed: Allergy & Precautions, H&P , NPO status , Patient's Chart, lab work & pertinent test results  Airway Mallampati: II  TM Distance: >3 FB Neck ROM: Full    Dental  (+) Dental Advisory Given   Pulmonary neg pulmonary ROS,    Pulmonary exam normal breath sounds clear to auscultation       Cardiovascular negative cardio ROS Normal cardiovascular exam Rhythm:Regular Rate:Normal     Neuro/Psych negative neurological ROS  negative psych ROS   GI/Hepatic Neg liver ROS, Symptomatic cholelithiasis   Endo/Other  negative endocrine ROS  Renal/GU negative Renal ROS     Musculoskeletal negative musculoskeletal ROS (+)   Abdominal   Peds  Hematology  (+) anemia ,   Anesthesia Other Findings   Reproductive/Obstetrics (+) Pregnancy                             Anesthesia Physical  Anesthesia Plan  ASA: II  Anesthesia Plan: Epidural   Post-op Pain Management:    Induction:   PONV Risk Score and Plan:   Airway Management Planned: Natural Airway  Additional Equipment:   Intra-op Plan:   Post-operative Plan:   Informed Consent: I have reviewed the patients History and Physical, chart, labs and discussed the procedure including the risks, benefits and alternatives for the proposed anesthesia with the patient or authorized representative who has indicated his/her understanding and acceptance.     Plan Discussed with:   Anesthesia Plan Comments: (Labs reviewed. Platelets acceptable, patient not taking any blood thinning medications. Risks and benefits discussed with patient, patient expressed understanding and wished to proceed.)        Anesthesia Quick Evaluation

## 2017-03-22 NOTE — Anesthesia Procedure Notes (Signed)
Epidural Patient location during procedure: OB Start time: 03/22/2017 7:42 AM End time: 03/22/2017 7:48 AM  Staffing Anesthesiologist: Beryle LatheBrock, Thomas E, MD Performed: anesthesiologist   Preanesthetic Checklist Completed: patient identified, pre-op evaluation, timeout performed, IV checked, risks and benefits discussed and monitors and equipment checked  Epidural Patient position: sitting Prep: DuraPrep Patient monitoring: continuous pulse ox and blood pressure Approach: midline Location: L2-L3 Injection technique: LOR saline  Needle:  Needle type: Tuohy  Needle gauge: 17 G Needle length: 9 cm Needle insertion depth: 5 cm Catheter size: 19 Gauge Catheter at skin depth: 10 cm Test dose: negative and Other (1% lidocaine)  Additional Notes Patient identified. Risks including, but not limited to, bleeding, infection, nerve damage, paralysis, inadequate analgesia, blood pressure changes, nausea, vomiting, allergic reaction, postpartum back pain, itching, and headache were discussed. Patient expressed understanding and wished to proceed. Sterile prep and drape, including hand hygiene, mask, and sterile gloves were used. The patient was positioned and the spine was prepped. The skin was anesthetized with lidocaine. No paraesthesia or other complication noted. The patient did not experience any signs of intravascular injection such as tinnitus or metallic taste in mouth, nor signs of intrathecal spread such as rapid motor block. Please see nursing notes for vital signs. The patient tolerated the procedure well.   Leslye Peerhomas Brock, MDReason for block:procedure for pain

## 2017-03-22 NOTE — H&P (Signed)
27 y.o. 3030w1d  G1P0000 comes in c/o ctx an LOF.  Otherwise has good fetal movement and no bleeding.  Past Medical History:  Diagnosis Date  . Anemia   . Family history of adverse reaction to anesthesia    mother woke up during abdominal surgery 3 years ago  . Nausea & vomiting    last month  . Pregnancy    19 weeks and few days michelle horvath ob dr  . Symptomatic cholelithiasis     Past Surgical History:  Procedure Laterality Date  . CHOLECYSTECTOMY N/A 11/16/2016   Procedure: LAPAROSCOPIC CHOLECYSTECTOMY;  Surgeon: Andria MeuseWhite, Christopher M, MD;  Location: [facility];  Service: General;  Laterality: N/A;  . TOOTH EXTRACTION      OB History  Gravida Para Term Preterm AB Living  1 0 0 0 0    SAB TAB Ectopic Multiple Live Births  0 0 0        # Outcome Date GA Lbr Len/2nd Weight Sex Delivery Anes PTL Lv  1 Current               Social History   Socioeconomic History  . Marital status: Single    Spouse name: Not on file  . Number of children: Not on file  . Years of education: Not on file  . Highest education level: Not on file  Social Needs  . Financial resource strain: Not on file  . Food insecurity - worry: Not on file  . Food insecurity - inability: Not on file  . Transportation needs - medical: Not on file  . Transportation needs - non-medical: Not on file  Occupational History  . Not on file  Tobacco Use  . Smoking status: Never Smoker  . Smokeless tobacco: Never Used  Substance and Sexual Activity  . Alcohol use: No    Alcohol/week: 0.0 oz  . Drug use: No  . Sexual activity: Yes  Other Topics Concern  . Not on file  Social History Narrative   ** Merged History Encounter **       Penicillins and Shrimp [shellfish allergy]    Prenatal Transfer Tool  Maternal Diabetes: No Genetic Screening: Normal Maternal Ultrasounds/Referrals: Normal Fetal Ultrasounds or other Referrals:  None Maternal Substance Abuse:  No Significant Maternal Medications:   None Significant Maternal Lab Results: Lab values include: Group B Strep negative, Other:  zika testing 09/2016 negative  Other PNC: visit to [place] early pregnancy, zika testing negative    Vitals:   03/22/17 0223 03/22/17 0630  BP: 120/70 114/62  Pulse: 82 78  Resp: 18 18  Temp: 98.7 F (37.1 C) 98.3 F (36.8 C)  TempSrc: Oral Oral  Weight: 87.6 kg (193 lb 1.9 oz)   Height: 5\' 4"  (1.626 m)     Physical exam per MAU note SVE:  3.5 60/-3 per MAU FHTs:  125, good STV, NST R Toco:  Not tracing well   A/P   Admitted by MA with labor  SROM approx 5am  GBS neg  Routine care  OsmondALLAHAN, Luther ParodySIDNEY

## 2017-03-22 NOTE — Anesthesia Pain Management Evaluation Note (Signed)
  CRNA Pain Management Visit Note  Patient: Kelsey Black, 27 y.o., female  "Hello I am a [facility] of the anesthesia team [facility]. We have an anesthesia team available at all times to provide care throughout the hospital, including epidural management and anesthesia for C-section. I don't know your plan for the delivery whether it a natural birth, water birth, IV sedation, nitrous supplementation, doula or epidural, but we want to meet your pain goals."   1.Was your pain managed to your expectations on prior hospitalizations?   No prior hospitalizations  2.What is your expectation for pain management during this hospitalization?     Epidural  3.How can we help you reach that goal? Maintain epidrual   Record the patient's initial score and the patient's pain goal.   Pain: 0  Pain Goal: 3 [facility] wants you to be able to say your pain was always managed very well.  Christion Leonhard 03/22/2017

## 2017-03-23 LAB — CBC
HCT: 26.8 % — ABNORMAL LOW (ref 36.0–46.0)
HEMOGLOBIN: 8.6 g/dL — AB (ref 12.0–15.0)
MCH: 25.7 pg — ABNORMAL LOW (ref 26.0–34.0)
MCHC: 32.1 g/dL (ref 30.0–36.0)
MCV: 80.2 fL (ref 78.0–100.0)
Platelets: 222 10*3/uL (ref 150–400)
RBC: 3.34 MIL/uL — ABNORMAL LOW (ref 3.87–5.11)
RDW: 18.8 % — AB (ref 11.5–15.5)
WBC: 17.9 10*3/uL — AB (ref 4.0–10.5)

## 2017-03-23 MED ORDER — IBUPROFEN 100 MG/5ML PO SUSP
600.0000 mg | Freq: Four times a day (QID) | ORAL | Status: DC
  Administered 2017-03-23 – 2017-03-24 (×5): 600 mg via ORAL
  Filled 2017-03-23 (×10): qty 30

## 2017-03-23 NOTE — Clinical Social Work Maternal (Signed)
CLINICAL SOCIAL WORK MATERNAL/CHILD NOTE  Patient Details  Name: Kelsey Black MRN: [id number] Date of Birth: 06/09/1990  Date:  03/23/2017  Clinical Social Worker Initiating Note:  Khamarion Bjelland, LCSW Date/Time: Initiated:  03/23/17/1200     Child's Name:  Hailey   Biological Parents:  Mother, Father(Fawne Scruton and Markeith Davis)   Need for Interpreter:  None   Reason for Referral:  Other (Comment)(NICU admission after Code Apgar)   Address:  [address] [place] [address]    Phone number:  [phone] (home)     Additional phone number:   Household Members/Support Persons (HM/SP):   Household Member/Support Person 1   HM/SP Name Relationship DOB or Age  HM/SP -1 Markeith Davis FOB/Significant other    HM/SP -2        HM/SP -3        HM/SP -4        HM/SP -5        HM/SP -6        HM/SP -7        HM/SP -8          Natural Supports (not living in the home):  Friends, Parent, Immediate Family, Extended Family(MOB reports that she has a good support system.  Her mother and 15 year old sister were here with her today.)   Professional Supports: None   Employment:     Type of Work:     Education:      Homebound arranged:    Financial Resources:  Medicaid   Other Resources:      Cultural/Religious Considerations Which May Impact Care: None stated.  Strengths:      Psychotropic Medications:         Pediatrician:       Pediatrician List:       [place]    [place]    [place]    [place]    [place]      Pediatrician Fax Number:    Risk Factors/Current Problems:  None   Cognitive State:  Alert , Able to Concentrate , Linear Thinking , Other (Comment)(CSW feels MOB is still somewhat in shock regarding baby's traumatic delivery, admission to NICU, subsequent seizures and her own physical illness (fever) which has now resolved.)   Mood/Affect:  Calm ,  Interested    CSW Assessment: CSW met briefly with MOB in her first floor room/106 to introduce services, offer support, and complete assessment due to baby's admission to NICU at 39 weeks after Code Apgar with subsequent seizures. MOB was standing near her bathroom door and states she and her family were getting ready to go upstairs to see baby after she went to the bathroom.  CSW offered to return at a later time after introducing role and reason for visit, but MOB invited CSW to stay and talk with her mother and sister while she was in the bathroom.  CSW did so and spoke about the family and where everyone lives.  MGM states she plans to be called Abuela and that MOB will come to her home for two weeks while she recovers from delivery and then will return to her apartment with FOB.  She and FOB are in a relationship and live together.  MOB's sister, Dangelly (15), states that baby is the first of her generation and that everyone is excited about her.  She states they have two other siblings, Kimberrly (21) and Johnny (18).  MGM explained   that she was with MOB during delivery, as was FOB.  MOB then joined the conversation and states she does not know what led to baby's admission to NICU because "I spaced out."  She explained, "everything happened too fast.  I had a rag over my face because I had a fever and they all told me that she came out lifeless."  CSW provided supportive brief counseling as MOB began to process her birth experience and related emotions.  She appears to still be in shock and very much still processing the information she is collecting.  She acknowledges that she has not been able to absorb everything she has been told since baby's birth.  CSW normalized and validated this and recommends family conferences away from baby's bedside to help her achieve understanding of the situation.  CSW did not complete full assessment, but focused on supporting MOB and ensuring that she is aware that CSW  is here to talk with if she feels she would like to process her feelings at any time.  CSW provided MOB with contact information and asked her to call any time.  CSW asked for permission to check in with MOB periodically and she agreed.  CSW Plan/Description:  No Further Intervention Required/No Barriers to Discharge, Psychosocial Support and Ongoing Assessment of Needs, Perinatal Mood and Anxiety Disorder (PMADs) Education    Normal Recinos Elizabeth, LCSW 03/23/2017, 4:14 PM 

## 2017-03-23 NOTE — Progress Notes (Signed)
Post Partum Day 1 Subjective: no complaints, up ad lib, voiding and tolerating PO  Objective: Blood pressure 114/68, pulse 66, temperature 97.6 F (36.4 C), temperature source Oral, resp. rate 18, height 5\' 4"  (1.626 m), weight 87.6 kg (193 lb 1.9 oz), last menstrual period 05/26/2016, SpO2 98 %, unknown if currently breastfeeding.  Physical Exam:  General: alert, cooperative and appears stated age Lochia: appropriate Uterine Fundus: firm   Recent Labs    03/22/17 0605 03/23/17 0504  HGB 10.6* 8.6*  HCT 33.4* 26.8*    Assessment/Plan: Patient doing well Baby in NICU. Pt desires to breast feed. Attempted to pump x1 last night but fell asleep. Will work with lactation today Baby being evaluated for seizure-like activity    LOS: 1 day   Kelsey Black 03/23/2017, 10:00 AM

## 2017-03-23 NOTE — Lactation Note (Signed)
This note was copied from a baby's chart. Lactation Consultation Note  Patient Name: Girl Jefm BryantKatherine Mercado Colon Today's Date: 03/23/2017   Baby 18 hours old in NICU. Reviewed hand expression w/ drops expressed. Ordered breastmilk labels and provided mother with yellow labels and NICU booklet. Assisted w/ DEBP using hand compression. Recommend mother post pump 8 times per day for 10-20 min with DEBP on initiation setting. Reviewed cleaning and milk storage.  Mother states it is frustrating to now get volume at this time.  Encouraged mother. Recommend pumping q 2-3 hours. Mom made aware of O/P services, breastfeeding support groups, community resources, and our phone # for post-discharge questions.        Maternal Data    Feeding    LATCH Score                   Interventions    Lactation Tools Discussed/Used     Consult Status      Hardie PulleyBerkelhammer, Yashas Camilli Boschen 03/23/2017, 3:28 PM

## 2017-03-23 NOTE — Anesthesia Postprocedure Evaluation (Signed)
Anesthesia Post Note  Patient: Kelsey BryantKatherine Mercado Black  Procedure(s) Performed: AN AD HOC LABOR EPIDURAL     Patient location during evaluation: Mother Baby Anesthesia Type: Epidural Level of consciousness: awake Pain management: pain level controlled Vital Signs Assessment: post-procedure vital signs reviewed and stable Respiratory status: spontaneous breathing Cardiovascular status: stable Postop Assessment: patient able to bend at knees and epidural receding Anesthetic complications: no    Last Vitals:  Vitals:   03/23/17 0040 03/23/17 0440  BP: 110/61 114/68  Pulse: 77 66  Resp: 18 18  Temp: 36.8 C 36.4 C  SpO2: 98% 98%    Last Pain:  Vitals:   03/23/17 0616  TempSrc:   PainSc: Asleep   Pain Goal: Patients Stated Pain Goal: 10 (03/22/17 0727)               Edison PaceWILKERSON,Wilgus Deyton

## 2017-03-24 DIAGNOSIS — O411290 Chorioamnionitis, unspecified trimester, not applicable or unspecified: Secondary | ICD-10-CM | POA: Insufficient documentation

## 2017-03-24 NOTE — Discharge Summary (Signed)
Obstetric Discharge Summary Reason for Admission: rupture of membranes Prenatal Procedures: NST Intrapartum Procedures: spontaneous vaginal delivery Postpartum Procedures: none Complications-Operative and Postpartum: 2 degree perineal laceration Hemoglobin  Date Value Ref Range Status  03/23/2017 8.6 (L) 12.0 - 15.0 g/dL Final   HCT  Date Value Ref Range Status  03/23/2017 26.8 (L) 36.0 - 46.0 % Final    Discharge Diagnoses: Term Pregnancy-delivered  Discharge Information: Date: 03/24/2017 Activity: pelvic rest Diet: routine Medications: Ibuprofen and Iron Condition: stable Instructions: refer to practice specific booklet Discharge to: home Follow-up Information    Philip AspenCallahan, Sidney, DO Follow up in 4 week(s).   Specialty:  Obstetrics and Gynecology Contact information: [address] [place] [address] [phone]           Newborn Data: Live born female  Birth Weight: 6 lb 3.1 oz (2810 g) APGAR: 1, 5  Newborn Delivery   Birth date/time:  03/22/2017 21:16:00 Delivery type:  Vaginal, Spontaneous    Baby in NICU for eval of sepsis.  Jourden Gilson A 03/24/2017, 7:45 AM

## 2017-03-24 NOTE — Progress Notes (Signed)
Patient is eating, ambulating, voiding.  Pain control is good.  Vitals:   03/22/17 2340 03/23/17 0040 03/23/17 0440 03/23/17 1940  BP: 104/60 110/61 114/68 110/64  Pulse: 64 77 66 74  Resp: 18 18 18 18   Temp: 98.4 F (36.9 C) 98.3 F (36.8 C) 97.6 F (36.4 C) 98.1 F (36.7 C)  TempSrc: Oral  Oral Axillary  SpO2: 100% 98% 98% 100%  Weight:      Height:        Fundus firm Perineum without swelling.  Lab Results  Component Value Date   WBC 17.9 (H) 03/23/2017   HGB 8.6 (L) 03/23/2017   HCT 26.8 (L) 03/23/2017   MCV 80.2 03/23/2017   PLT 222 03/23/2017    --/--/O POS (02/04 0605)/RI  A/P Post partum day 2.  Routine care.  Expect d/c today.  Baby in NICU being evaluated.    Brennin Durfee A

## 2017-03-25 ENCOUNTER — Ambulatory Visit: Payer: Self-pay

## 2017-03-25 NOTE — Lactation Note (Signed)
This note was copied from a baby's chart. Lactation Consultation Note  Patient Name: Kelsey Black Today's Date: 03/25/2017 Reason for consult: Follow-up assessment;NICU baby;Early term 37-38.6wks(saw mom in NICU - Rm  206 )  LC was called by the RN Kelsey Black to see mom. ( moms request)  LC asked Kelsey Black to check moms breast and if engorged - ice and LC would be up to check her.  Per mom was pumping every 3 hours until she went home and has not pumped since.  Today LC assessed breast tissue with moms permission. Noted milk to be in , but not engorged.  ( mom had iced) , LC reviewed the DEBP set and the #24 F was a good fit, mom comfortable.  She pumped 20 mins without the LC there and 4-5 ml EBM yield.  LC recommended to take a break, then ice , and pump again.  LC discussed with mom the importance of consistent pumping every 2-3 hours, and PRN around the  Clock. Hand express 1st and mom demo back and did well. And after pumping.  [facility] and will [facility] mom up [facility].  Mother informed of post-discharge support and given phone number to the lactation department, including services for phone call assistance; out-patient appointments; and breastfeeding support group. List of other breastfeeding resources in the community given in the handout. Encouraged mother to call for problems or concerns related to breastfeeding.   Maternal Data Has patient been taught Hand Expression?: Yes Does the patient have breastfeeding experience prior to this delivery?: No  Feeding Feeding Type: Formula Nipple Type: Slow - flow  LATCH Score                   Interventions Interventions: Breast feeding basics reviewed;DEBP  Lactation Tools Discussed/Used Tools: Pump;Flanges Flange Size: 24;27;Other (comment)(mentioned to mom mamy need #27 when breast are really full ) Breast pump type: Double-Electric Breast Pump WIC Program: Yes Pump Review: Setup, frequency, and  cleaning;Milk Storage Initiated by:: MAI  Date initiated:: 03/25/17   Consult Status Consult Status: PRN Follow-up type: Other (comment)(baby in NICU )    Kelsey Black 03/25/2017, 1:41 PM

## 2017-11-02 ENCOUNTER — Encounter (HOSPITAL_COMMUNITY): Payer: Self-pay | Admitting: *Deleted

## 2017-11-02 ENCOUNTER — Emergency Department (HOSPITAL_COMMUNITY): Payer: Medicaid Other

## 2017-11-02 ENCOUNTER — Other Ambulatory Visit: Payer: Self-pay

## 2017-11-02 ENCOUNTER — Emergency Department (HOSPITAL_COMMUNITY)
Admission: EM | Admit: 2017-11-02 | Discharge: 2017-11-02 | Disposition: A | Discharge status: Home / Self Care | Payer: Medicaid Other | Attending: Emergency Medicine | Admitting: Emergency Medicine

## 2017-11-02 DIAGNOSIS — R0789 Other chest pain: Secondary | ICD-10-CM | POA: Diagnosis present

## 2017-11-02 DIAGNOSIS — Z79899 Other long term (current) drug therapy: Secondary | ICD-10-CM | POA: Insufficient documentation

## 2017-11-02 DIAGNOSIS — R091 Pleurisy: Secondary | ICD-10-CM | POA: Diagnosis not present

## 2017-11-02 LAB — CBC
HEMATOCRIT: 37 % (ref 36.0–46.0)
Hemoglobin: 11.3 g/dL — ABNORMAL LOW (ref 12.0–15.0)
MCH: 26.2 pg (ref 26.0–34.0)
MCHC: 30.5 g/dL (ref 30.0–36.0)
MCV: 85.8 fL (ref 78.0–100.0)
Platelets: 332 10*3/uL (ref 150–400)
RBC: 4.31 MIL/uL (ref 3.87–5.11)
RDW: 15.6 % — AB (ref 11.5–15.5)
WBC: 9.4 10*3/uL (ref 4.0–10.5)

## 2017-11-02 LAB — URINALYSIS, ROUTINE W REFLEX MICROSCOPIC
BILIRUBIN URINE: NEGATIVE
Glucose, UA: NEGATIVE mg/dL
KETONES UR: NEGATIVE mg/dL
LEUKOCYTES UA: NEGATIVE
NITRITE: NEGATIVE
PH: 6 (ref 5.0–8.0)
Protein, ur: NEGATIVE mg/dL
SPECIFIC GRAVITY, URINE: 1.014 (ref 1.005–1.030)

## 2017-11-02 LAB — COMPREHENSIVE METABOLIC PANEL
ALT: 13 U/L (ref 0–44)
ANION GAP: 8 (ref 5–15)
AST: 14 U/L — AB (ref 15–41)
Albumin: 3.5 g/dL (ref 3.5–5.0)
Alkaline Phosphatase: 81 U/L (ref 38–126)
BILIRUBIN TOTAL: 0.5 mg/dL (ref 0.3–1.2)
BUN: 17 mg/dL (ref 6–20)
CALCIUM: 8.7 mg/dL — AB (ref 8.9–10.3)
CO2: 24 mmol/L (ref 22–32)
Chloride: 106 mmol/L (ref 98–111)
Creatinine, Ser: 0.81 mg/dL (ref 0.44–1.00)
GFR calc Af Amer: >60 mL/min (ref >60)
GLUCOSE: 94 mg/dL (ref 70–99)
POTASSIUM: 4.2 mmol/L (ref 3.5–5.1)
Sodium: 138 mmol/L (ref 135–145)
TOTAL PROTEIN: 6.6 g/dL (ref 6.5–8.1)

## 2017-11-02 LAB — I-STAT BETA HCG BLOOD, ED (MC, WL, AP ONLY): I-stat hCG, quantitative: <5 m[IU]/mL (ref <5)

## 2017-11-02 LAB — LIPASE, BLOOD: Lipase: 30 U/L (ref 11–51)

## 2017-11-02 LAB — D-DIMER, QUANTITATIVE: D-Dimer, Quant: 0.38 ug/mL-FEU (ref 0.00–0.50)

## 2017-11-02 MED ORDER — IBUPROFEN 400 MG PO TABS
400.0000 mg | ORAL_TABLET | Freq: Once | ORAL | Status: AC
  Administered 2017-11-02: 400 mg via ORAL
  Filled 2017-11-02: qty 1

## 2017-11-02 MED ORDER — IOPAMIDOL (ISOVUE-370) INJECTION 76%
100.0000 mL | Freq: Once | INTRAVENOUS | Status: AC | PRN
  Administered 2017-11-02: 100 mL via INTRAVENOUS

## 2017-11-02 MED ORDER — BARIUM SULFATE 0.1 % PO SUSP
ORAL | Status: AC
  Filled 2017-11-02: qty 1

## 2017-11-02 MED ORDER — IBUPROFEN 800 MG PO TABS: 800.0000 mg | ORAL_TABLET | Freq: Three times a day (TID) | ORAL | 0 refills | Status: AC

## 2017-11-02 NOTE — ED Provider Notes (Signed)
Medical screening examination/treatment/procedure(s) were conducted as a shared visit with non-physician practitioner(s) and myself.  I personally evaluated the patient during the encounter.  None Patient is 7 months postpartum and breast-feeding.  She developed sudden very sharp pain in her left chest.  Much worse with deep breaths or trying to lie flat.  She reports she cannot breathe without severe pain.  Patient is breast-feeding.  No oral contraceptive use.  No smoking.  She did have a plane trip to [place] within the last month.  No personal history of DVT.  Patient is alert and appropriate.  No respiratory distress.  Normal for heart and lungs.  No reproducible pain to palpation.  Patient does have severe pain with deep inspiration.  Abdomen is soft nontender.  Extremities normal.  We will proceed with CT PE study to rule out PE.  I agree with plan of management.   Arby BarrettePfeiffer, Aloma Boch, MD 11/02/17 [id number]

## 2017-11-02 NOTE — ED Triage Notes (Signed)
Pt reports LUQ and left side chest pain that started last night. Increases with movement and deep breathing. Denies injury.

## 2017-11-02 NOTE — Discharge Instructions (Addendum)
I have prescribed NSAIDS for your pain,please take as directed.If your symptoms worsen, or you experience any shortness of breath you may return to the ED for reevaluation.

## 2017-11-02 NOTE — ED Provider Notes (Signed)
[facility] EMERGENCY DEPARTMENT Provider Note   CSN: [id number] Arrival date & time: 11/02/17  7425     History   Chief Complaint Chief Complaint  Patient presents with  . Abdominal Pain  . Chest Pain    HPI Duane Trias Colon is a 27 y.o. female.  27 y.o female with a PMH of anemia presents to the ED with a chief complaint of chest pain x 6 hours.  She reports her pain is located on the left side under her left breast.  She describes it as a sharp pain constant with no radiation.  Patient states the pain is worse with inspiration, yawning.  She reports "hurts so bad with yawning ", she has only been able to take swallow breath. She states the pain is exacerbated by lifting her left arm above her head. She denies any previous history of DVT, PE, or family clotting disorders. She denies any fever, abdominal pain, urinary symptoms or other complaints.  He denies any previous history of smoking and states she has not been drinking as she is currently breast-feeding had a delivery in February.  She denies any oral contraceptive use.      Past Medical History:  Diagnosis Date  . Anemia   . Family history of adverse reaction to anesthesia    mother woke up during abdominal surgery 3 years ago  . Nausea & vomiting    last month  . Pregnancy    19 weeks and few days michelle horvath ob dr  . Symptomatic cholelithiasis     Patient Active Problem List   Diagnosis Date Noted  . Indication for care in labor or delivery 03/22/2017  . Chronic cholecystitis 11/16/2016  . Bacterial vaginosis 08/01/2014    Past Surgical History:  Procedure Laterality Date  . CHOLECYSTECTOMY N/A 11/16/2016   Procedure: LAPAROSCOPIC CHOLECYSTECTOMY;  Surgeon: Andria Meuse, MD;  Location: [facility];  Service: General;  Laterality: N/A;  . TOOTH EXTRACTION       OB History    Gravida  1   Para  1   Term  1   Preterm  0   AB  0   Living  1     SAB  0   TAB  0     Ectopic  0   Multiple  0   Live Births  1            Home Medications    Prior to Admission medications   Medication Sig Start Date End Date Taking? Authorizing Provider  Prenatal Vit-Fe Fumarate-FA (PRENATAL MULTIVITAMIN) TABS tablet Take 1 tablet by mouth daily at 12 noon.   Yes [provider]  famotidine (PEPCID) 20 MG tablet Take 1 tablet (20 mg total) by mouth 2 (two) times daily. Patient not taking: Reported on 11/02/2017 11/06/16   Hurshel Party, CNM  ibuprofen (ADVIL,MOTRIN) 800 MG tablet Take 1 tablet (800 mg total) by mouth 3 (three) times daily for 7 days. 11/02/17 11/09/17  Claude Manges, PA-C  promethazine (PHENERGAN) 25 MG tablet Take 0.5-1 tablets (12.5-25 mg total) by mouth every 6 (six) hours as needed. Patient not taking: Reported on 03/21/2017 11/06/16   Hurshel Party, CNM    Family History Family History  Problem Relation Age of Onset  . Asthma Mother   . Asthma Sister   . Asthma Brother   . Asthma Father   . Epilepsy Father   . Diabetes Father   . Diabetes Maternal  Grandfather   . Hypertension Maternal Grandfather     Social History Social History   Tobacco Use  . Smoking status: Never Smoker  . Smokeless tobacco: Never Used  Substance Use Topics  . Alcohol use: No    Alcohol/week: 0.0 standard drinks  . Drug use: No     Allergies   Penicillins and Shrimp [shellfish allergy]   Review of Systems Review of Systems  Constitutional: Negative for chills and fever.  HENT: Negative for ear pain and sore throat.   Eyes: Negative for pain and visual disturbance.  Respiratory: Positive for shortness of breath. Negative for cough.   Cardiovascular: Positive for chest pain. Negative for palpitations and leg swelling.  Gastrointestinal: Negative for abdominal pain, diarrhea, nausea and vomiting.  Genitourinary: Negative for dysuria and hematuria.  Musculoskeletal: Negative for arthralgias and back pain.  Skin: Negative for  color change and rash.  Neurological: Negative for seizures, syncope, light-headedness and headaches.  All other systems reviewed and are negative.    Physical Exam Updated Vital Signs BP 113/67   Pulse 73   Temp 98.7 F (37.1 C) (Oral)   Resp 16   Ht 5\' 5"  (1.651 m)   Wt 83.9 kg   LMP 10/03/2017   SpO2 97%   BMI 30.79 kg/m   Physical Exam  Constitutional: She appears well-developed and well-nourished.  HENT:  Head: Normocephalic and atraumatic.  Eyes: Pupils are equal, round, and reactive to light.  Cardiovascular: Normal heart sounds.  Pulmonary/Chest: No tachypnea. No respiratory distress. She has no decreased breath sounds. She has no wheezes. She exhibits no tenderness.  She appears uncomfortable as she tries to take a deep breath in, effort is diminished. Pain not reproducible with palpation at this time.  Exacerbated by patient lifting left arm above her head.    Nursing note and vitals reviewed.    ED Treatments / Results  Labs (all labs ordered are listed, but only abnormal results are displayed) Labs Reviewed  COMPREHENSIVE METABOLIC PANEL - Abnormal; Notable for the following components:      Result Value   Calcium 8.7 (*)    AST 14 (*)    All other components within normal limits  CBC - Abnormal; Notable for the following components:   Hemoglobin 11.3 (*)    RDW 15.6 (*)    All other components within normal limits  URINALYSIS, ROUTINE W REFLEX MICROSCOPIC - Abnormal; Notable for the following components:   Color, Urine STRAW (*)    Hgb urine dipstick SMALL (*)    Bacteria, UA FEW (*)    All other components within normal limits  LIPASE, BLOOD  D-DIMER, QUANTITATIVE (NOT AT [facility])  I-STAT BETA HCG BLOOD, ED ([facility], [facility], [facility] ONLY)    EKG None  Radiology Dg Chest 2 View  Result Date: 11/02/2017 CLINICAL DATA:  Shortness of breath and pain of the left breast area starting tonight. EXAM: CHEST - 2 VIEW COMPARISON:  06/19/2010 FINDINGS: Normal heart  size and pulmonary vascularity. No focal airspace disease or consolidation in the lungs. No blunting of costophrenic angles. No pneumothorax. Mediastinal contours appear intact. Surgical clips in the right upper quadrant. IMPRESSION: No active cardiopulmonary disease. Electronically Signed   By: Burman Nieves M.D.   On: 11/02/2017 06:58   Ct Angio Chest Pe W And/or Wo Contrast  Result Date: 11/02/2017 CLINICAL DATA:  Left lower quadrant pain, left-sided chest pain EXAM: CT ANGIOGRAPHY CHEST WITH CONTRAST TECHNIQUE: Multidetector CT imaging of the chest was performed  using the standard protocol during bolus administration of intravenous contrast. Multiplanar CT image reconstructions and MIPs were obtained to evaluate the vascular anatomy. CONTRAST:  100mL ISOVUE-370 IOPAMIDOL (ISOVUE-370) INJECTION 76% COMPARISON:  None. FINDINGS: Cardiovascular: Satisfactory opacification of the pulmonary arteries to the segmental level. No evidence of pulmonary embolism. Normal heart size. No pericardial effusion. Mediastinum/Nodes: No enlarged mediastinal, hilar, or axillary lymph nodes. Thyroid gland, trachea, and esophagus demonstrate no significant findings. Lungs/Pleura: Lungs are clear. No pleural effusion or pneumothorax. Upper Abdomen: No acute abnormality. Musculoskeletal: No chest wall abnormality. No acute or significant osseous findings. Review of the MIP images confirms the above findings. IMPRESSION: 1. No evidence of pulmonary embolus. Electronically Signed   By: Elige KoHetal  Patel   On: 11/02/2017 14:00    Procedures Procedures (including critical care time)  Medications Ordered in ED Medications  ibuprofen (ADVIL,MOTRIN) tablet 400 mg (400 mg Oral Given 11/02/17 1114)  iopamidol (ISOVUE-370) 76 % injection 100 mL (100 mLs Intravenous Contrast Given 11/02/17 1310)     Initial Impression / Assessment and Plan / ED Course  I have reviewed the triage vital signs and the nursing notes.  Pertinent labs &  imaging results that were available during my care of the patient were reviewed by me and considered in my medical decision making (see chart for details).  Clinical Course as of Nov 03 1415  Tue Nov 02, 2017  0956 Within normal limit for patients baseline  Hemoglobin(!): 11.3 [JS]    Clinical Course User Index [JS] Claude MangesSoto, Sheralee Qazi, PA-C    Into the ED with chest pain that began at 2 AM this morning.  Patient has not tried any medical therapy for this.  Upon examination the pain is not reproducible with palpation she appears very uncomfortable as she tries to take in a deep breath her effort is diminished.  There are breath sounds present throughout all lung fields.Patient pain is worsen with lying flat but better with sitting up.   CBC showed a globin of 11.3 this seems to be patient's baseline as her last visit her hemoglobin was 8.6, 7 months ago. CMP showed no electrolyte abnormality, creatine is within normal limit, Lipase is normal.  Chest Xray showed no pneumonia, pleural effusion, or pneumothorax. EKG showed no changes consistent with ischemia, NO STEMI.  D dimer ordered to r/o embolism, results 0.38 negative at this time.    12:38 PM Patient evaluated and seen by Dr. Vanessa KickPffeifer, patient is concerned about pulmonary embolism will order CT Angio chest to further R/O embolism.  2:13 PM CT Angio showed no pulmonary embolism.  Time will treat patient with NSAIDs such as ibuprofen for her pleurisy.  She is instructed to return if symptoms worsen or she cannot get relief or her pain increases.  She and agrees and understands plan.  Return precautions discussed with patient.  Final Clinical Impressions(s) / ED Diagnoses   Final diagnoses:  Chest wall pain  Pleurisy    ED Discharge Orders         Ordered    ibuprofen (ADVIL,MOTRIN) 800 MG tablet  3 times daily     11/02/17 1411           Claude MangesSoto, Elma Shands, PA-C 11/02/17 1417    Arby BarrettePfeiffer, Marcy, MD 11/04/17 [phone]

## 2017-11-02 NOTE — ED Notes (Signed)
Patient transported to CT 

## 2018-03-13 ENCOUNTER — Other Ambulatory Visit: Payer: Self-pay

## 2018-03-13 ENCOUNTER — Emergency Department (HOSPITAL_COMMUNITY): Payer: Medicaid Other

## 2018-03-13 ENCOUNTER — Encounter (HOSPITAL_COMMUNITY): Payer: Self-pay

## 2018-03-13 ENCOUNTER — Emergency Department (HOSPITAL_COMMUNITY)
Admission: EM | Admit: 2018-03-13 | Discharge: 2018-03-13 | Disposition: A | Discharge status: Home / Self Care | Payer: Medicaid Other | Attending: Emergency Medicine | Admitting: Emergency Medicine

## 2018-03-13 DIAGNOSIS — R0781 Pleurodynia: Secondary | ICD-10-CM

## 2018-03-13 DIAGNOSIS — R0789 Other chest pain: Secondary | ICD-10-CM | POA: Diagnosis present

## 2018-03-13 DIAGNOSIS — R0602 Shortness of breath: Secondary | ICD-10-CM | POA: Insufficient documentation

## 2018-03-13 DIAGNOSIS — Z880 Allergy status to penicillin: Secondary | ICD-10-CM | POA: Diagnosis not present

## 2018-03-13 LAB — CBC
HCT: 35.3 % — ABNORMAL LOW (ref 36.0–46.0)
Hemoglobin: 10.8 g/dL — ABNORMAL LOW (ref 12.0–15.0)
MCH: 25.7 pg — ABNORMAL LOW (ref 26.0–34.0)
MCHC: 30.6 g/dL (ref 30.0–36.0)
MCV: 84 fL (ref 80.0–100.0)
Platelets: 300 10*3/uL (ref 150–400)
RBC: 4.2 MIL/uL (ref 3.87–5.11)
RDW: 15.9 % — ABNORMAL HIGH (ref 11.5–15.5)
WBC: 3.9 10*3/uL — AB (ref 4.0–10.5)
nRBC: 0 % (ref 0.0–0.2)

## 2018-03-13 LAB — BASIC METABOLIC PANEL
Anion gap: 7 (ref 5–15)
BUN: 6 mg/dL (ref 6–20)
CO2: 24 mmol/L (ref 22–32)
Calcium: 8.4 mg/dL — ABNORMAL LOW (ref 8.9–10.3)
Chloride: 105 mmol/L (ref 98–111)
Creatinine, Ser: 0.63 mg/dL (ref 0.44–1.00)
GFR calc non Af Amer: >60 mL/min (ref >60)
Glucose, Bld: 94 mg/dL (ref 70–99)
Potassium: 4.1 mmol/L (ref 3.5–5.1)
SODIUM: 136 mmol/L (ref 135–145)

## 2018-03-13 LAB — D-DIMER, QUANTITATIVE: D-Dimer, Quant: 0.68 ug/mL-FEU — ABNORMAL HIGH (ref 0.00–0.50)

## 2018-03-13 LAB — I-STAT BETA HCG BLOOD, ED (MC, WL, AP ONLY): I-stat hCG, quantitative: <5 m[IU]/mL (ref <5)

## 2018-03-13 LAB — I-STAT TROPONIN, ED: Troponin i, poc: 0 ng/mL (ref 0.00–0.08)

## 2018-03-13 MED ORDER — FAMOTIDINE 20 MG PO TABS
20.0000 mg | ORAL_TABLET | Freq: Once | ORAL | Status: AC
  Administered 2018-03-13: 20 mg via ORAL
  Filled 2018-03-13: qty 1

## 2018-03-13 MED ORDER — SODIUM CHLORIDE 0.9% FLUSH
3.0000 mL | Freq: Once | INTRAVENOUS | Status: AC
  Administered 2018-03-13: 3 mL via INTRAVENOUS

## 2018-03-13 MED ORDER — PREDNISONE 20 MG PO TABS
60.0000 mg | ORAL_TABLET | Freq: Once | ORAL | Status: AC
  Administered 2018-03-13: 60 mg via ORAL
  Filled 2018-03-13: qty 3

## 2018-03-13 MED ORDER — IOPAMIDOL (ISOVUE-370) INJECTION 76%
INTRAVENOUS | Status: AC
  Filled 2018-03-13: qty 100

## 2018-03-13 MED ORDER — IOPAMIDOL (ISOVUE-370) INJECTION 76%
75.0000 mL | Freq: Once | INTRAVENOUS | Status: AC | PRN
  Administered 2018-03-13: 75 mL via INTRAVENOUS

## 2018-03-13 MED ORDER — DIPHENHYDRAMINE HCL 50 MG/ML IJ SOLN
25.0000 mg | Freq: Once | INTRAMUSCULAR | Status: AC
  Administered 2018-03-13: 25 mg via INTRAVENOUS
  Filled 2018-03-13: qty 1

## 2018-03-13 MED ORDER — KETOROLAC TROMETHAMINE 30 MG/ML IJ SOLN
30.0000 mg | Freq: Once | INTRAMUSCULAR | Status: AC
  Administered 2018-03-13: 30 mg via INTRAVENOUS
  Filled 2018-03-13: qty 1

## 2018-03-13 MED ORDER — ACETAMINOPHEN 500 MG PO TABS
1000.0000 mg | ORAL_TABLET | Freq: Once | ORAL | Status: AC
  Administered 2018-03-13: 1000 mg via ORAL
  Filled 2018-03-13: qty 2

## 2018-03-13 NOTE — ED Notes (Signed)
Patient verbalizes understanding of discharge instructions. Opportunity for questioning and answers were provided. 

## 2018-03-13 NOTE — Discharge Instructions (Addendum)
Take ibuprofen and Tylenol for the pain.  Close follow-up with your primary care physician

## 2018-03-13 NOTE — ED Triage Notes (Signed)
Pt presents with exertional right sided chest pain, right arm pain, SOB x2 days , and generalized weakness x1 month. Pt states she has been using an inhaler without relief. Pt states pain in her arm worsens with movement. Pt states pain in her chest worsens with exertion, describes it as a stabbing pain. Pt A+Ox4, skin warm and dry. Pt also endorses a sore throat, dizziness, and lack of appetite x2-3 weeks.

## 2018-03-13 NOTE — ED Provider Notes (Signed)
[facility]   CSN: [id number] Arrival date & time: 03/13/18  1212     History   Chief Complaint Chief Complaint  Patient presents with  . Chest Pain  . Shortness of Breath  . Weakness    HPI Kelsey Black is a 28 y.o. female.  HPI Patient is a 28 year old female presents the emergency department complaints of exertional right-sided chest pain as well as associated shortness of breath and pleuritic pain.  She does have a history of pleurisy.  No history of DVT or pulmonary embolism in her or her family members.  No unilateral leg swelling.  No recent travel or surgery.  Denies productive cough.  Reports pleuritic pain that radiates from her right chest into her right shoulder.  No prior history of ACS.  Denies back pain.  Denies abdominal pain.  No nausea or vomiting.  Symptoms are mild to moderate in severity.   Past Medical History:  Diagnosis Date  . Anemia   . Family history of adverse reaction to anesthesia    mother woke up during abdominal surgery 3 years ago  . Nausea & vomiting    last month  . Pregnancy    19 weeks and few days michelle horvath ob dr  . Symptomatic cholelithiasis     Patient Active Problem List   Diagnosis Date Noted  . Indication for care in labor or delivery 03/22/2017  . Chronic cholecystitis 11/16/2016  . Bacterial vaginosis 08/01/2014    Past Surgical History:  Procedure Laterality Date  . CHOLECYSTECTOMY N/A 11/16/2016   Procedure: LAPAROSCOPIC CHOLECYSTECTOMY;  Surgeon: Andria MeuseWhite, Christopher M, MD;  Location: [facility];  Service: General;  Laterality: N/A;  . TOOTH EXTRACTION       OB History    Gravida  1   Para  1   Term  1   Preterm  0   AB  0   Living  1     SAB  0   TAB  0   Ectopic  0   Multiple  0   Live Births  1            Home Medications    Prior to Admission medications   Medication Sig Start Date End Date Taking? Authorizing Provider   PROAIR HFA [phone] 2461(90 Base) MCG/ACT inhaler Inhale 2 puffs into the lungs every 6 (six) hours as needed for shortness of breath.  12/11/17  Yes [provider]  famotidine (PEPCID) 20 MG tablet Take 1 tablet (20 mg total) by mouth 2 (two) times daily. Patient not taking: Reported on 11/02/2017 11/06/16   Leftwich-Kirby, Wilmer FloorLisa A, CNM  promethazine (PHENERGAN) 25 MG tablet Take 0.5-1 tablets (12.5-25 mg total) by mouth every 6 (six) hours as needed. Patient not taking: Reported on 03/21/2017 11/06/16   Hurshel PartyLeftwich-Kirby, Lisa A, CNM    Family History Family History  Problem Relation Age of Onset  . Asthma Mother   . Asthma Sister   . Asthma Brother   . Asthma Father   . Epilepsy Father   . Diabetes Father   . Diabetes Maternal Grandfather   . Hypertension Maternal Grandfather     Social History Social History   Tobacco Use  . Smoking status: Never Smoker  . Smokeless tobacco: Never Used  Substance Use Topics  . Alcohol use: No    Alcohol/week: 0.0 standard drinks  . Drug use: No     Allergies   Penicillins and  Shrimp [shellfish allergy]   Review of Systems Review of Systems  All other systems reviewed and are negative.    Physical Exam Updated Vital Signs BP 113/75   Pulse 82   Temp 98.7 F (37.1 C) (Oral)   Resp (!) 22   Ht 5\' 5"  (1.651 m)   Wt 88 kg   SpO2 100%   BMI 32.28 kg/m   Physical Exam Vitals signs and nursing note reviewed.  Constitutional:      General: She is not in acute distress.    Appearance: She is well-developed.  HENT:     Head: Normocephalic and atraumatic.  Neck:     Musculoskeletal: Normal range of motion.  Cardiovascular:     Rate and Rhythm: Normal rate and regular rhythm.     Heart sounds: Normal heart sounds.  Pulmonary:     Effort: Pulmonary effort is normal.     Breath sounds: Normal breath sounds.  Abdominal:     General: There is no distension.     Palpations: Abdomen is soft.     Tenderness: There is no abdominal  tenderness.  Musculoskeletal: Normal range of motion.     Comments: Normal bilateral radial pulses.  Normal grip strength equal bilaterally.  Skin:    General: Skin is warm and dry.  Neurological:     Mental Status: She is alert and oriented to person, place, and time.  Psychiatric:        Judgment: Judgment normal.      ED Treatments / Results  Labs (all labs ordered are listed, but only abnormal results are displayed) Labs Reviewed  BASIC METABOLIC PANEL - Abnormal; Notable for the following components:      Result Value   Calcium 8.4 (*)    All other components within normal limits  CBC - Abnormal; Notable for the following components:   WBC 3.9 (*)    Hemoglobin 10.8 (*)    HCT 35.3 (*)    MCH 25.7 (*)    RDW 15.9 (*)    All other components within normal limits  D-DIMER, QUANTITATIVE (NOT AT [facility]) - Abnormal; Notable for the following components:   D-Dimer, Quant 0.68 (*)    All other components within normal limits  I-STAT TROPONIN, ED  I-STAT BETA HCG BLOOD, ED ([facility], [facility], [facility] ONLY)    EKG EKG Interpretation  Date/Time:  Sunday March 13 2018 12:28:10 EST Ventricular Rate:  89 PR Interval:    QRS Duration: 77 QT Interval:  341 QTC Calculation: 415 R Axis:   71 Text Interpretation:  Sinus rhythm No significant change was found Confirmed by Azalia Bilis ([id number]) on 03/13/2018 4:18:34 PM   Radiology Dg Chest 2 View  Result Date: 03/13/2018 CLINICAL DATA:  Chest pain EXAM: CHEST - 2 VIEW COMPARISON:  11/02/2017 FINDINGS: Normal heart size and mediastinal contours. No acute infiltrate or edema. No effusion or pneumothorax. No acute osseous findings. IMPRESSION: Negative chest. Electronically Signed   By: Marnee Spring M.D.   On: 03/13/2018 13:47   Ct Angio Chest Pe W And/or Wo Contrast  Result Date: 03/13/2018 CLINICAL DATA:  Left-sided chest pain EXAM: CT ANGIOGRAPHY CHEST WITH CONTRAST TECHNIQUE: Multidetector CT imaging of the chest was performed using the  standard protocol during bolus administration of intravenous contrast. Multiplanar CT image reconstructions and MIPs were obtained to evaluate the vascular anatomy. CONTRAST:  75mL ISOVUE-370 COMPARISON:  Chest x-ray from earlier in the same day. CT from 11/02/2017 FINDINGS: Cardiovascular: Disc aorta is within  normal limits without aneurysmal dilatation or dissection. No focal calcifications are noted. The heart is at the upper limits of normal in size likely accentuated by the recent postpartum state. The pulmonary artery shows a normal branching pattern without intraluminal filling defect to suggest pulmonary embolism. No coronary calcifications are noted. Mediastinum/Nodes: No hilar or mediastinal adenopathy is noted. The esophagus is within normal limits. The thoracic inlet is unremarkable. Lungs/Pleura: The lungs are well aerated bilaterally. No focal infiltrate or sizable effusion is seen. No pneumothorax is noted. Upper Abdomen: Visualized upper abdomen is within normal limits. Prior cholecystectomy is noted. Musculoskeletal: No acute bony abnormality is seen. Review of the MIP images confirms the above findings. IMPRESSION: No evidence of pulmonary emboli. No acute abnormality noted. Electronically Signed   By: Alcide CleverMark  Lukens M.D.   On: 03/13/2018 16:11    Procedures Procedures (including critical care time)  Medications Ordered in ED Medications  sodium chloride flush (NS) 0.9 % injection 3 mL (3 mLs Intravenous Given 03/13/18 1239)  ketorolac (TORADOL) 30 MG/ML injection 30 mg (30 mg Intravenous Given 03/13/18 1458)  acetaminophen (TYLENOL) tablet 1,000 mg (1,000 mg Oral Given 03/13/18 1458)  iopamidol (ISOVUE-370) 76 % injection 75 mL (75 mLs Intravenous Contrast Given 03/13/18 1532)     Initial Impression / Assessment and Plan / ED Course  I have reviewed the triage vital signs and the nursing notes.  Pertinent labs & imaging results that were available during my care of the patient were  reviewed by me and considered in my medical decision making (see chart for details).     Elevated d dimer but CT imaging negative for PE.  Likely pleurisy.  Home with anti-inflammatories.  Close primary care follow-up.  Patient understands return to the ER for new or worsening symptoms.  Doubt ACS.  Final Clinical Impressions(s) / ED Diagnoses   Final diagnoses:  None    ED Discharge Orders    None       Azalia Bilisampos, Remy Dia, MD 03/13/18 1623

## 2018-09-19 IMAGING — US US OB TRANSVAGINAL
1 series · 15 of 28 positions shown · non-contrast
Comparison: Pelvic ultrasound 01/19/2011

CLINICAL DATA: Cramping since 11 a.m.. Uncertain last menstrual
period.

EXAM:
OBSTETRIC <14 WK US AND TRANSVAGINAL OB US
TECHNIQUE: Both transabdominal and transvaginal ultrasound examinations were
performed for complete evaluation of the gestation as well as the
maternal uterus, adnexal regions, and pelvic cul-de-sac.
Transvaginal technique was performed to assess early pregnancy.

[Series 1: us ob transvaginal · 15 of 66 slices shown]
[im 1/66]
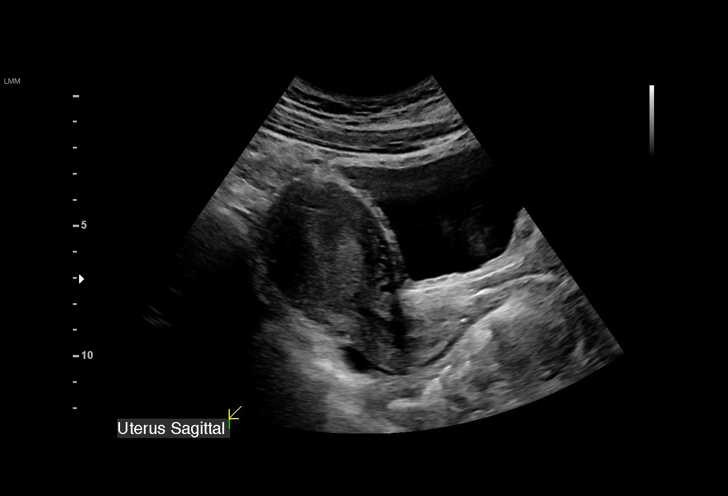
[im 5/66]
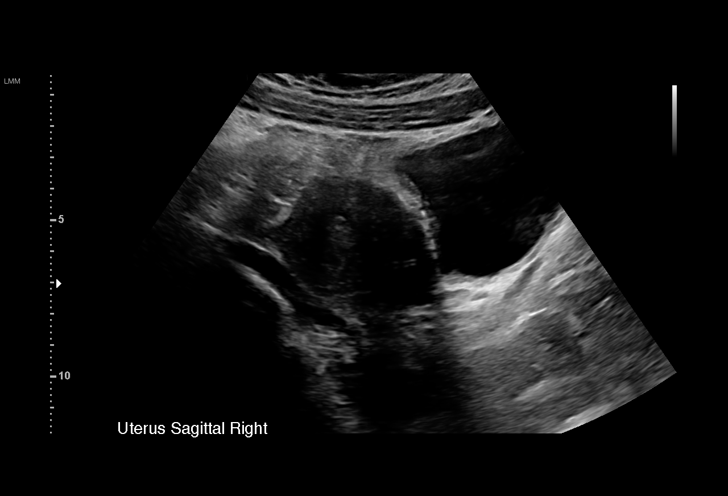
[im 10/66]
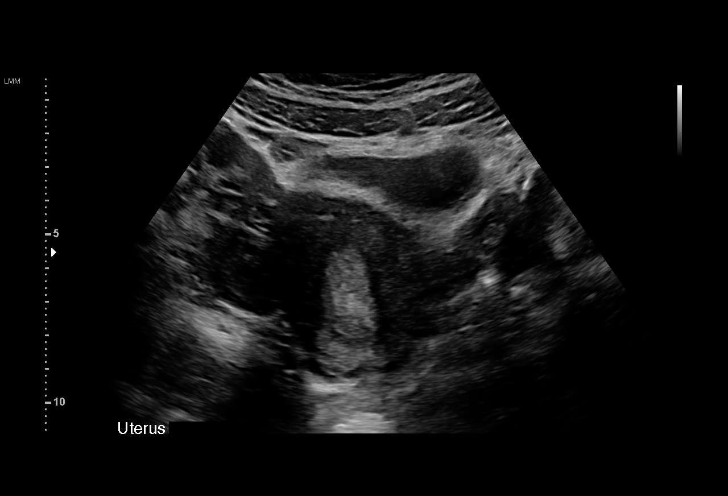
[im 15/66]
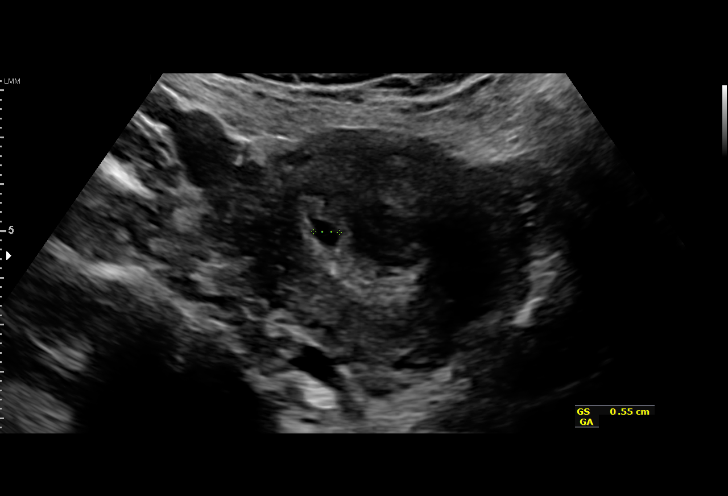
[im 20/66]
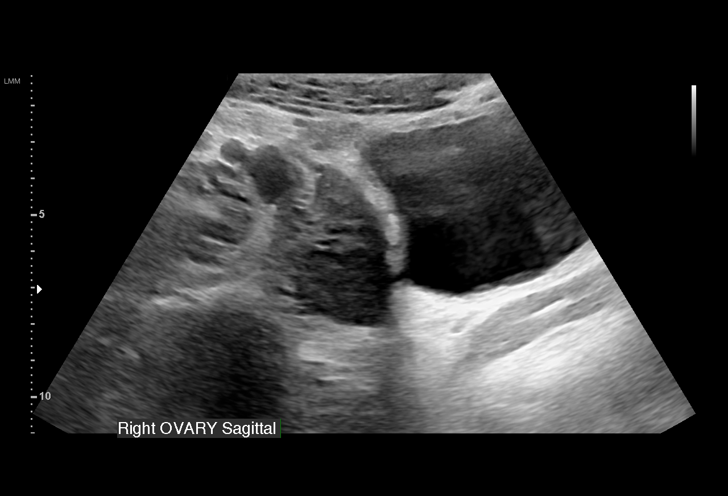
[im 25/66]
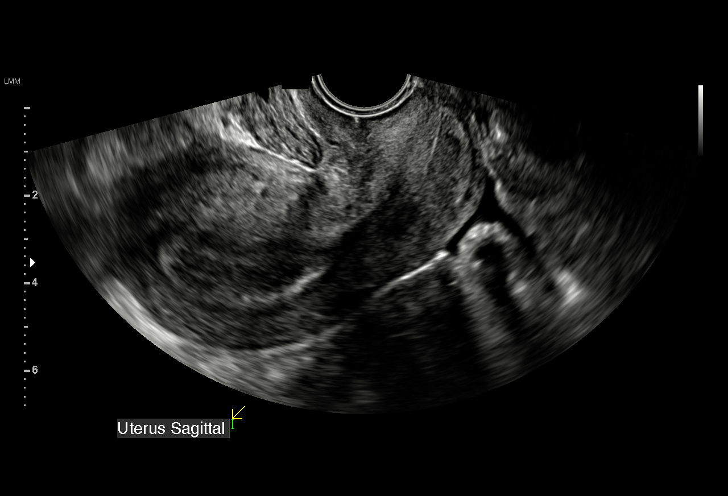
[im 29/66]
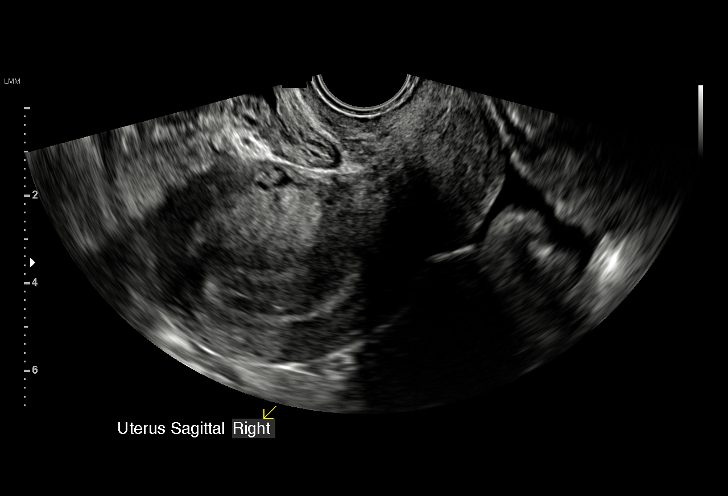
[im 34/66]
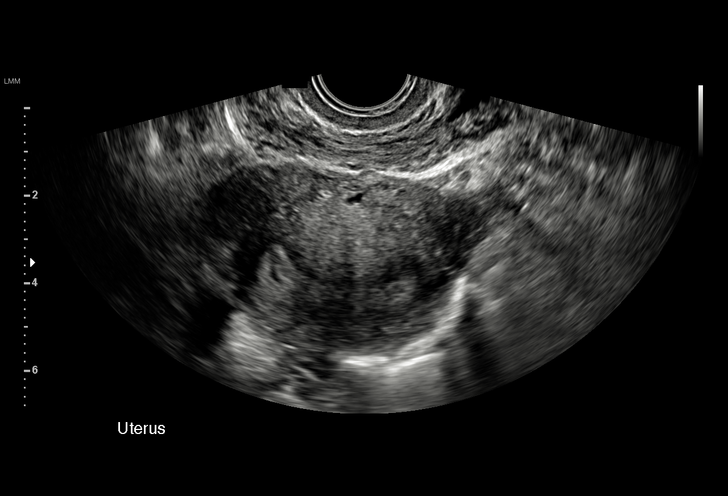
[im 37/66]
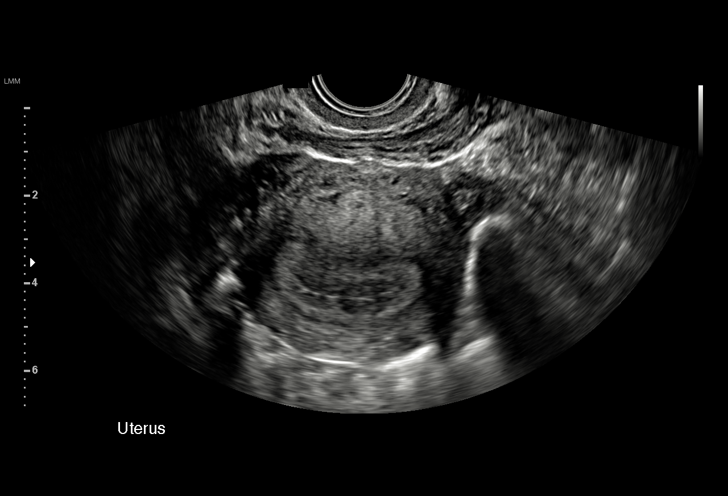
[im 41/66]
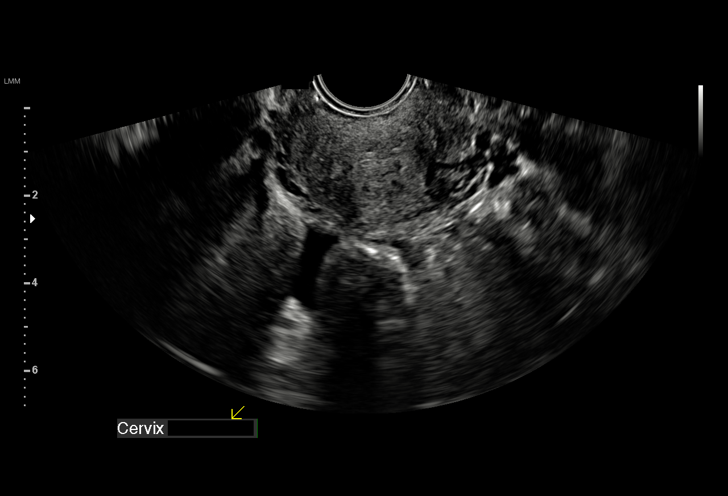
[im 46/66]
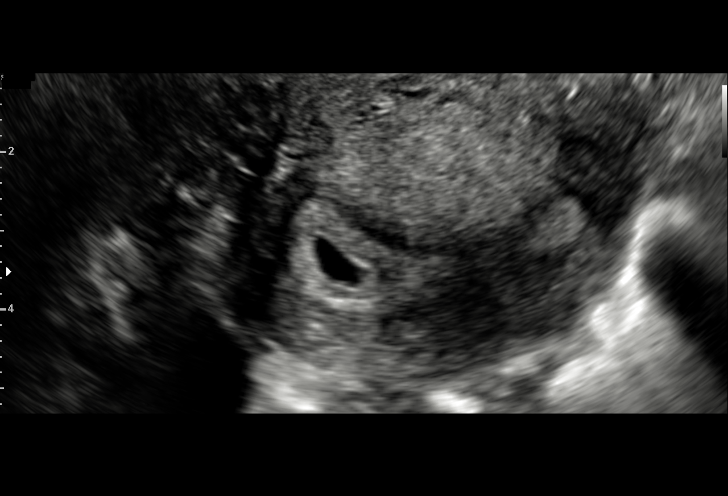
[im 51/66]
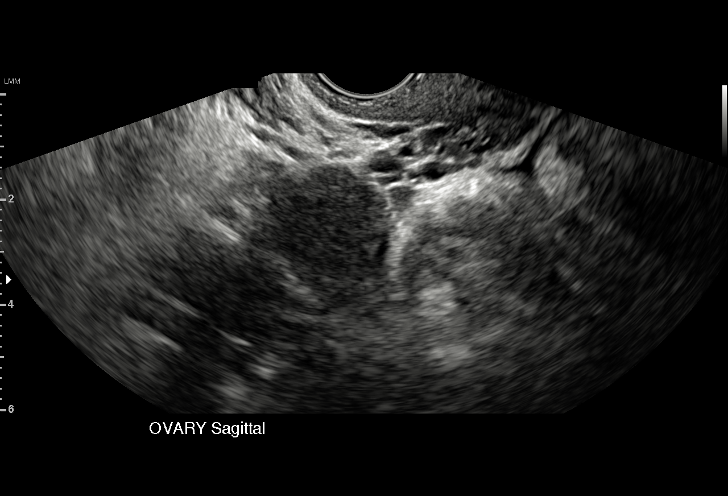
[im 56/66]
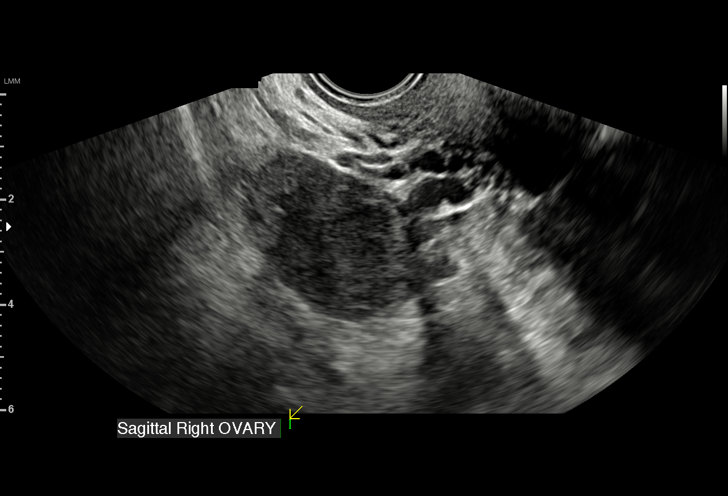
[im 61/66]
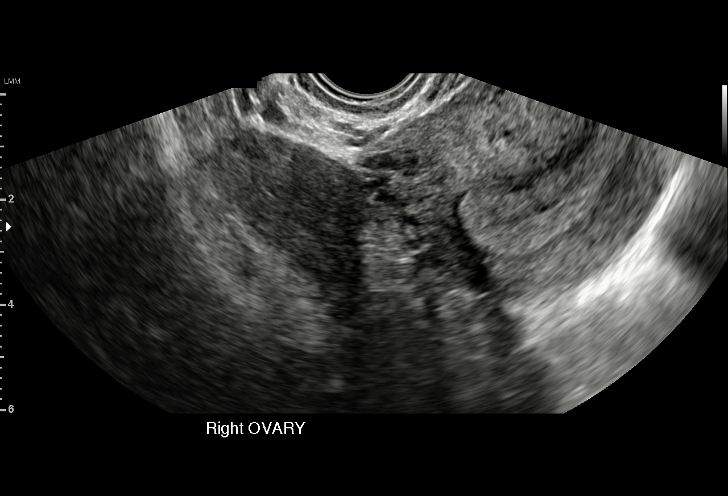
[im 66/66]
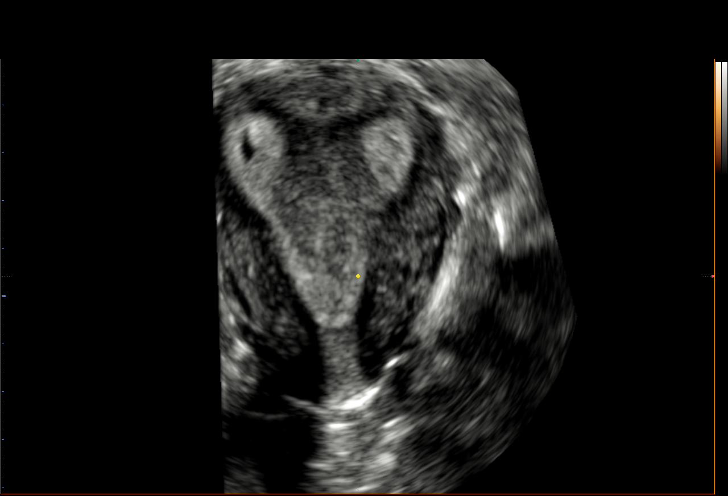

[15 of 28 positions shown; findings below may reference images not displayed]

FINDINGS: Intrauterine gestational sac: Present

Yolk sac:  Not seen

Embryo:  Not seen

MSD: 5.7 mm  mm   5 w   2  d

Subchorionic hemorrhage:  None visualized.

Maternal uterus/adnexae: Normal.

No free pelvic fluid.
IMPRESSION: Asymmetrically positioned 5.7 mm cystic structure within the
endometrium of the right horn of the uterine fundus, which may
represent an early gestational sac.

Probable early intrauterine gestational sac, but no yolk sac, fetal
pole, or cardiac activity yet visualized. Recommend follow-up
quantitative B-HCG levels and follow-up US in 14 days to assess
viability. This recommendation follows SRU consensus guidelines:
Diagnostic Criteria for Nonviable Pregnancy Early in the First
Trimester. N Engl J Med 5773; [DATE].

## 2018-12-25 IMAGING — US US ABDOMEN LIMITED
1 series · 15 of 25 positions shown · non-contrast
Comparison: None.

CLINICAL DATA: Abdominal pain with nausea.

EXAM:
ULTRASOUND ABDOMEN LIMITED RIGHT UPPER QUADRANT

[Series 1: us abdomen limited · 15 of 48 slices shown]
[im 1/48]
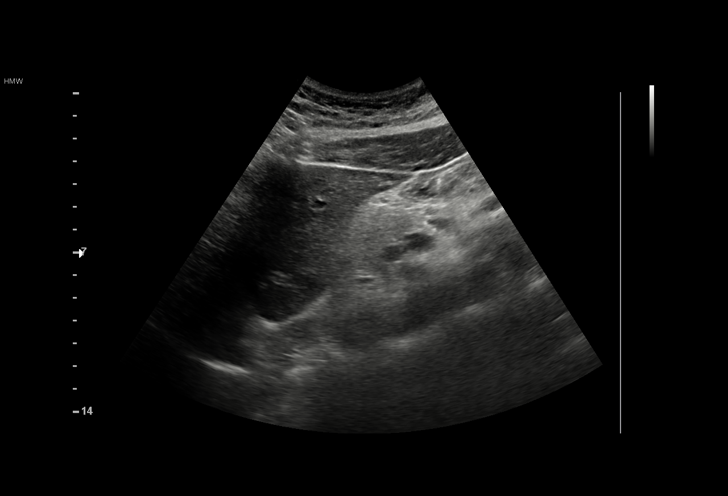
[im 4/48]
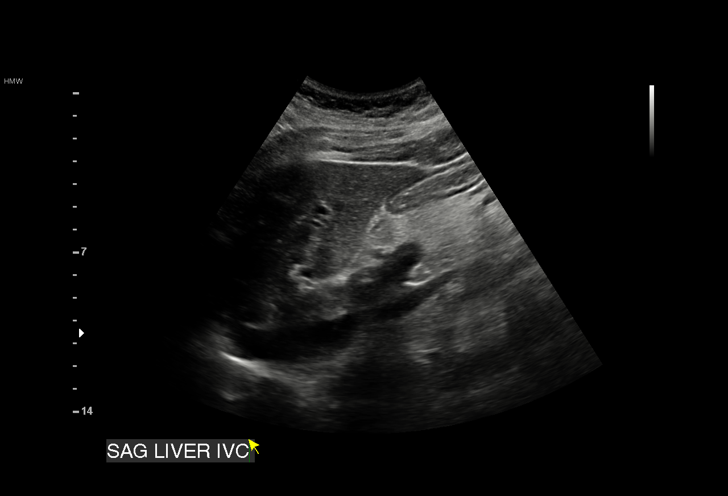
[im 8/48]
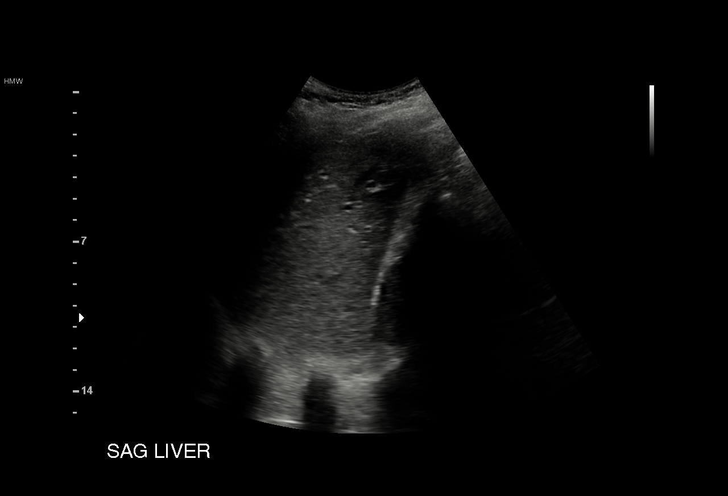
[im 10/48]
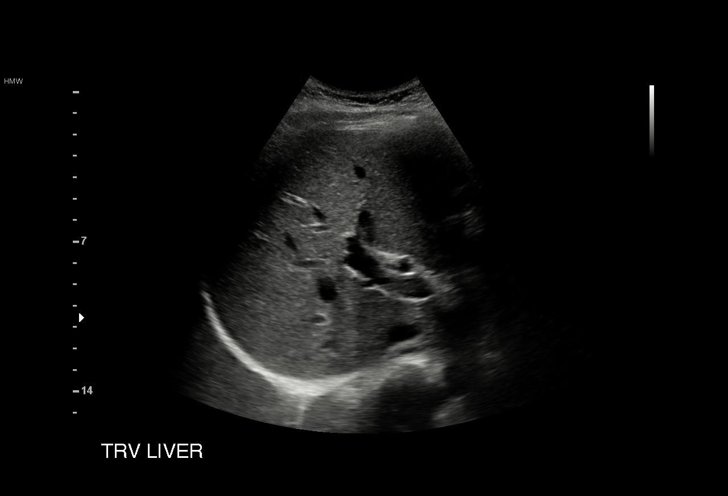
[im 14/48]
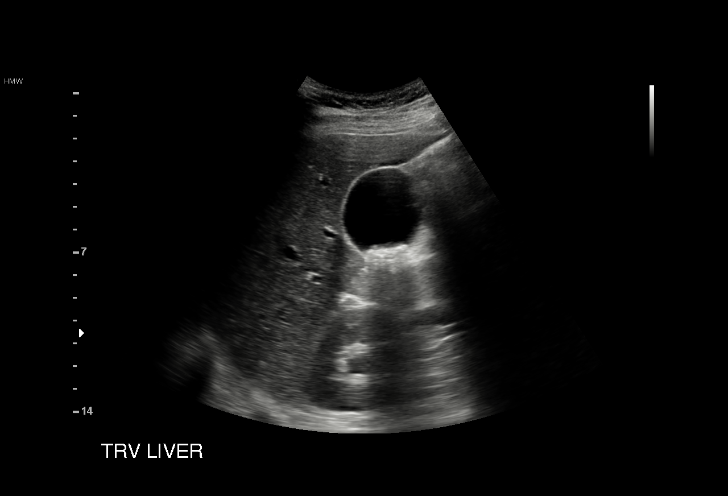
[im 18/48]
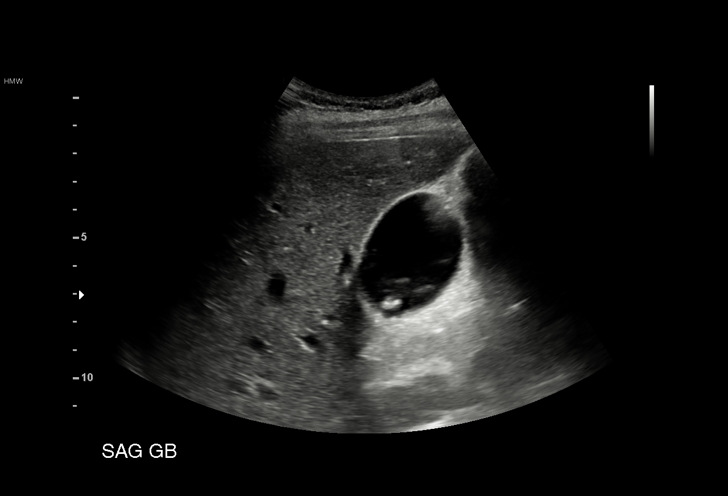
[im 20/48]
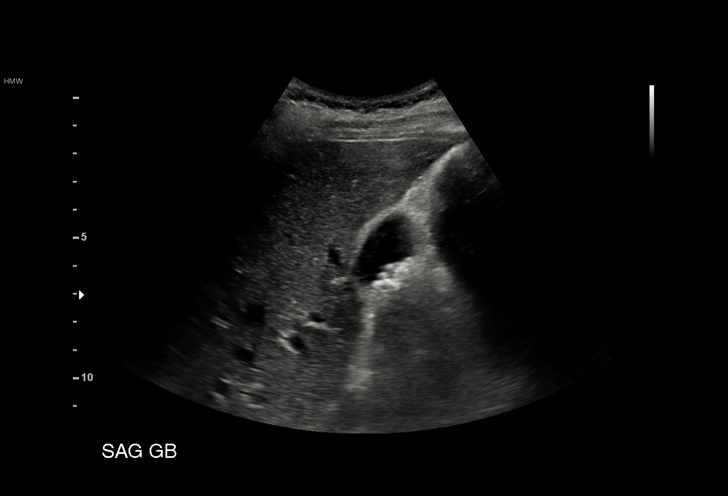
[im 24/48]
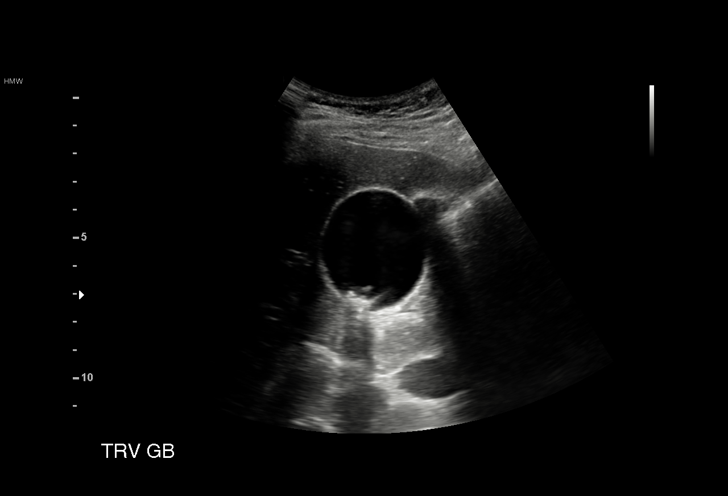
[im 28/48]
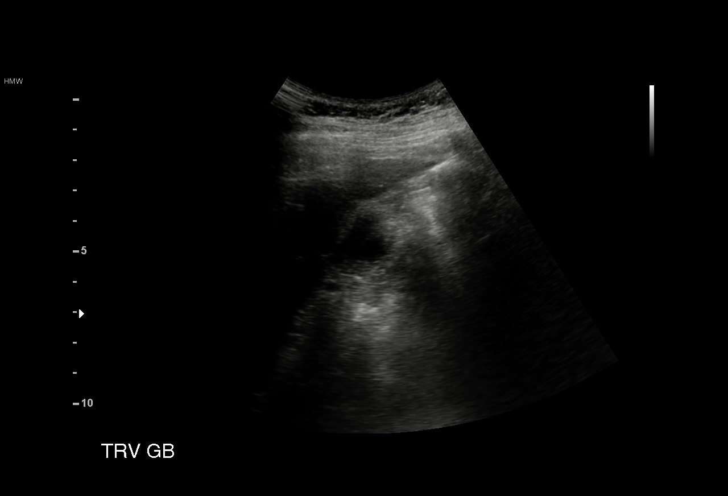
[im 30/48]
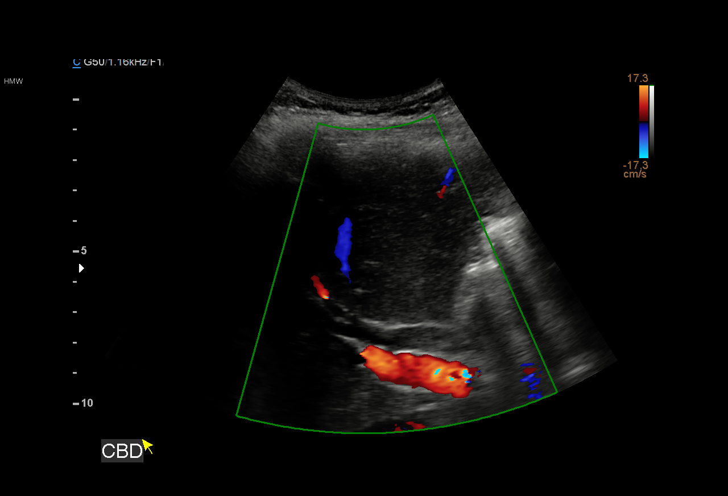
[im 34/48]
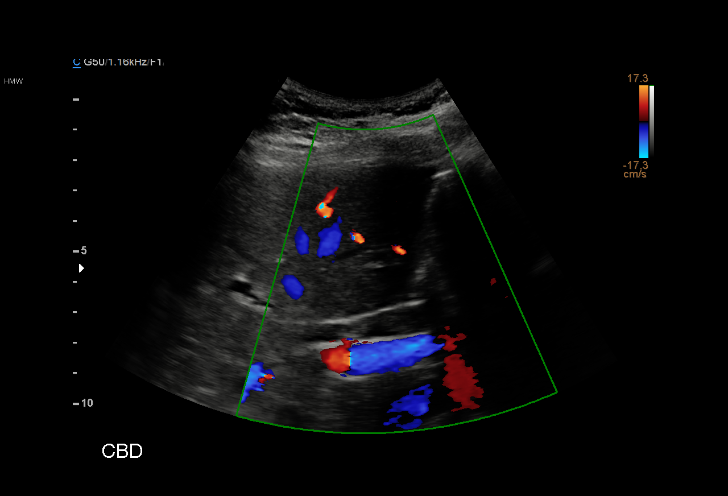
[im 38/48]
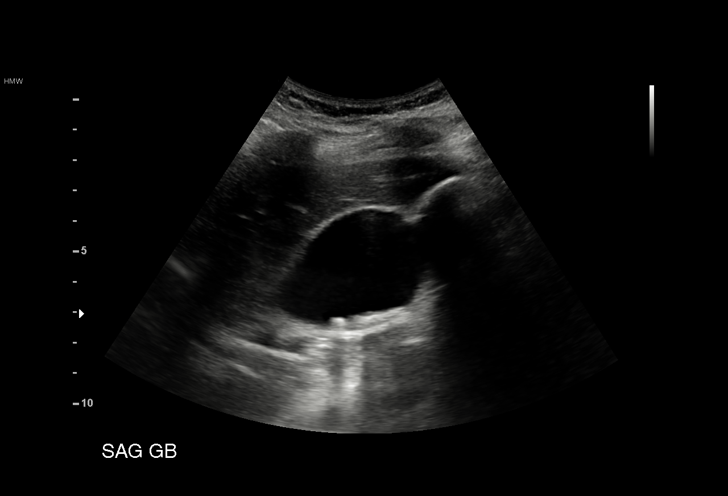
[im 40/48]
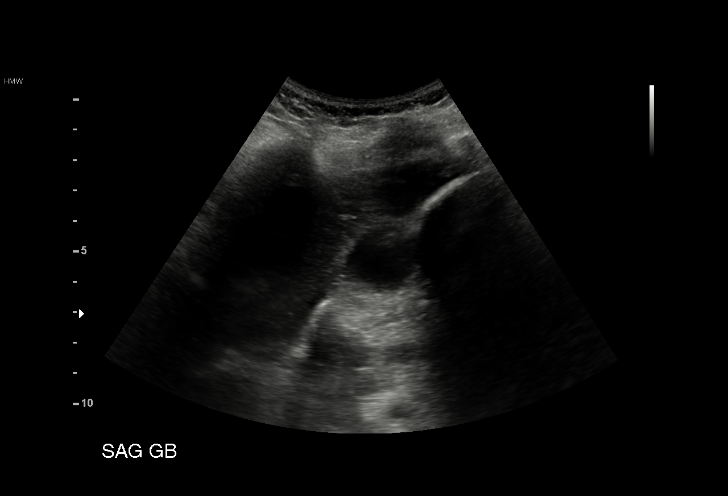
[im 44/48]
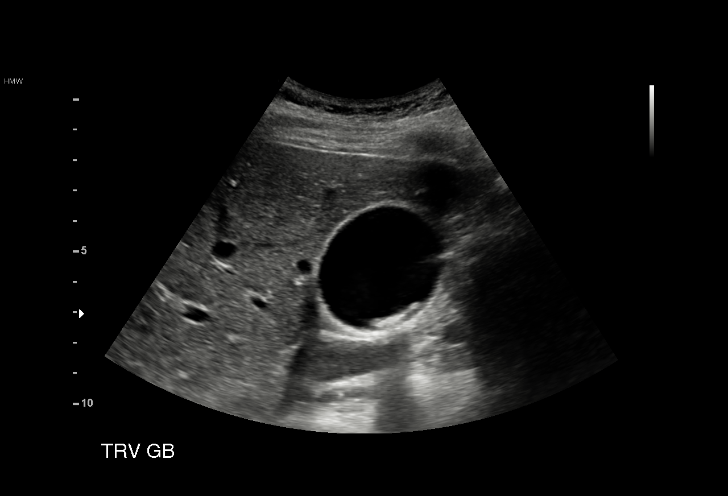
[im 48/48]
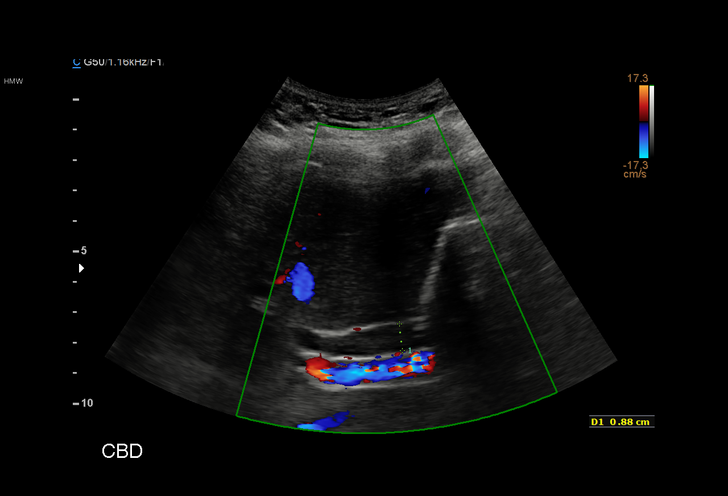

[15 of 25 positions shown; findings below may reference images not displayed]

FINDINGS: Gallbladder:

Within the gallbladder, there are echogenic foci which move and
shadow consistent with cholelithiasis. Largest individual gallstone
measures 5 mm in length. There is no gallbladder wall thickening or
pericholecystic fluid. No sonographic Murphy sign noted by
sonographer.

Common bile duct:

Diameter: 9 mm, dilated. No mass or calculus is evident in the
biliary ductal system. It should be noted that portions of the
distal common bile duct are obscured by gas.

Liver:

No focal lesion identified. No intrahepatic biliary duct dilatation
evident. Within normal limits in parenchymal echogenicity. Portal
vein is patent on color Doppler imaging with normal direction of
blood flow towards the liver.
IMPRESSION: Cholelithiasis. No gallbladder wall thickening or pericholecystic
fluid.

Dilatation of the proximal common bile duct. No mass or calculus is
evident in the visualized common bile duct. It should be noted,
however, that portions of the distal common bile duct are obscured
by gas. From an imaging standpoint, MRCP may be helpful to assess
for potential lesion more distally in the common bile duct is a
potential cause for the dilatation of the common bile duct in
regions which can be visualized.

## 2019-02-21 ENCOUNTER — Ambulatory Visit: Payer: Medicaid Other | Attending: Internal Medicine

## 2019-02-21 ENCOUNTER — Other Ambulatory Visit: Payer: Medicaid Other

## 2019-02-21 DIAGNOSIS — Z20822 Contact with and (suspected) exposure to covid-19: Secondary | ICD-10-CM

## 2019-02-23 LAB — NOVEL CORONAVIRUS, NAA: SARS-CoV-2, NAA: DETECTED — AB

## 2019-02-25 ENCOUNTER — Telehealth: Payer: Medicaid Other | Admitting: Nurse Practitioner

## 2019-02-25 DIAGNOSIS — R52 Pain, unspecified: Secondary | ICD-10-CM

## 2019-02-25 DIAGNOSIS — R059 Cough, unspecified: Secondary | ICD-10-CM

## 2019-02-25 DIAGNOSIS — R432 Parageusia: Secondary | ICD-10-CM

## 2019-02-25 DIAGNOSIS — Z20822 Contact with and (suspected) exposure to covid-19: Secondary | ICD-10-CM

## 2019-02-25 DIAGNOSIS — R430 Anosmia: Secondary | ICD-10-CM

## 2019-02-25 DIAGNOSIS — R519 Headache, unspecified: Secondary | ICD-10-CM

## 2019-02-25 DIAGNOSIS — R5081 Fever presenting with conditions classified elsewhere: Secondary | ICD-10-CM

## 2019-02-25 DIAGNOSIS — R05 Cough: Secondary | ICD-10-CM

## 2019-02-25 MED ORDER — BENZONATATE 100 MG PO CAPS: 100.0000 mg | ORAL_CAPSULE | Freq: Three times a day (TID) | ORAL | 0 refills | Status: DC | PRN

## 2019-02-25 NOTE — Progress Notes (Signed)
E-Visit for Corona Virus Screening   Your current symptoms could be consistent with the coronavirus.  Many health care providers can now test patients at their office but not all are.  [facility] has multiple testing sites. For information on our COVID testing locations and hours go to [email or web address]  We are enrolling you in our MyChart Home Monitoring for COVID19 . Daily you will receive a questionnaire within the MyChart website. Our COVID 19 response team will be monitoring your responses daily.  Testing Information: The COVID-19 Community Testing sites will begin testing BY APPOINTMENT ONLY.  You can schedule online at [email or web address]  If you do not have access to a smart phone or computer you may call [phone] for an appointment.  Testing Locations: Appointment schedule is 8 am to 3:30 pm at all sites  [place] indoors at [address], [place] [address] [facility]  indoors at [address]trance, [place], [place] [address] [place] indoors at [address], [place] [address]  Additional testing sites in the Community:  . For [facility] Testing sites in [place]  https://www.cvs.com/minuteclinic/covid-19-testing  . For Pop-up testing sites in [place]  https://covid19.ncdhhs.gov/about-covid-19/testing/find-my-testing-place/pop-testing-sites  . For Testing sites with regular hours https://onsms.org/[place]/  . For [place] [facility] https://[facility].com/covid-19-community-outreach-testing/  . For [facility] https://www.[place].gov[place]/human-services/health-department/coronavirus-covid-19-info/covid-19-testing  . For [place] testing in [place] https://www.[place].gov/[place]-c[place]/human-services/health-department/coronavirus-covid-19-info/covid-19-testing  . For [facility] testing in [place]   https://[facility].care/covidtesting  For  more  information about community testing call [phone]   We are enrolling you in our MyChart Home Monitoring for COVID19 . Daily you will receive a questionnaire within the MyChart website. Our COVID 19 response team will be monitoring your responses daily.  Please quarantine yourself while awaiting your test results. If you develop fever/cough/breathlessness, please stay home for 10 days with improving symptoms and until you have had 24 hours of no fever (without taking a fever reducer).  You should wear a mask or cloth face covering over your nose and mouth if you must be around other people or animals, including pets (even at home). Try to stay at least 6 feet away from other people. This will protect the people around you.  Please continue good preventive care measures, including:  frequent hand-washing, avoid touching your face, cover coughs/sneezes, stay out of crowds and keep a 6 foot distance from others.  COVID-19 is a respiratory illness with symptoms that are similar to the flu. Symptoms are typically mild to moderate, but there have been cases of severe illness and death due to the virus.   The following symptoms may appear 2-14 days after exposure: . Fever . Cough . Shortness of breath or difficulty breathing . Chills . Repeated shaking with chills . Muscle pain . Headache . Sore throat . New loss of taste or smell . Fatigue . Congestion or runny nose . Nausea or vomiting . Diarrhea  Go to the nearest hospital ED for assessment if fever/cough/breathlessness are severe or illness seems like a threat to life.  It is vitally important that if you feel that you have an infection such as this virus or any other virus that you stay home and away from places where you may spread it to others.  You should avoid contact with people age 65 and older.   You can use medication such as A prescription cough medication called Tessalon Perles 100 mg. You may take 1-2 capsules every 8 hours as    needed for cough  You may also take acetaminophen (Tylenol) as needed for fever.  Reduce your risk of any infection by using the same precautions used for avoiding the common cold or flu:  . Wash your hands often with soap and warm water for at least 20 seconds.  If soap and water are not readily available, use an alcohol-based hand sanitizer with at least 60% alcohol.  . If coughing or sneezing, cover your mouth and nose by coughing or sneezing into the elbow areas of your shirt or coat, into a tissue or into your sleeve (not your hands). . Avoid shaking hands with others and consider head nods or verbal greetings only. . Avoid touching your eyes, nose, or mouth with unwashed hands.  . Avoid close contact with people who are sick. . Avoid places or events with large numbers of people in one location, like concerts or sporting events. . Carefully consider travel plans you have or are making. . If you are planning any travel outside or inside the [place], visit the CDC's Travelers' Health webpage for the latest health notices. . If you have some symptoms but not all symptoms, continue to monitor at home and seek medical attention if your symptoms worsen. . If you are having a medical emergency, call 911.  HOME CARE . Only take medications as instructed by your medical team. . Drink plenty of fluids and get plenty of rest. . A steam or ultrasonic humidifier can help if you have congestion.   GET HELP RIGHT AWAY IF YOU HAVE EMERGENCY WARNING SIGNS** FOR COVID-19. If you or someone is showing any of these signs seek emergency medical care immediately. Call 911 or proceed to your closest emergency facility if: . You develop worsening high fever. . Trouble breathing . Bluish lips or face . Persistent pain or pressure in the chest . New confusion . Inability to wake or stay awake . You cough up blood. . Your symptoms become more severe  **This list is not all possible symptoms. Contact your  medical provider for any symptoms that are sever or concerning to you.  MAKE SURE YOU   Understand these instructions.  Will watch your condition.  Will get help right away if you are not doing well or get worse.  Your e-visit answers were reviewed by a board certified advanced clinical practitioner to complete your personal care plan.  Depending on the condition, your plan could have included both over the counter or prescription medications.  If there is a problem please reply once you have received a response from your provider.  Your safety is important to us.  If you have drug allergies check your prescription carefully.    You can use MyChart to ask questions about today's visit, request a non-urgent call back, or ask for a work or school excuse for 24 hours related to this e-Visit. If it has been greater than 24 hours you will need to follow up with your provider, or enter a new e-Visit to address those concerns. You will get an e-mail in the next two days asking about your experience.  I hope that your e-visit has been valuable and will speed your recovery. Thank you for using e-visits.   5-10 minutes spent reviewing and documenting in chart.  

## 2019-03-03 ENCOUNTER — Ambulatory Visit: Payer: Medicaid Other | Attending: Internal Medicine

## 2019-03-03 DIAGNOSIS — Z20822 Contact with and (suspected) exposure to covid-19: Secondary | ICD-10-CM

## 2019-03-04 LAB — NOVEL CORONAVIRUS, NAA: SARS-CoV-2, NAA: DETECTED — AB

## 2019-03-14 ENCOUNTER — Ambulatory Visit: Payer: Medicaid Other | Attending: Internal Medicine

## 2019-03-14 DIAGNOSIS — Z20822 Contact with and (suspected) exposure to covid-19: Secondary | ICD-10-CM

## 2019-03-15 LAB — NOVEL CORONAVIRUS, NAA: SARS-CoV-2, NAA: NOT DETECTED

## 2019-03-29 ENCOUNTER — Ambulatory Visit: Payer: Medicaid Other | Attending: Internal Medicine

## 2019-03-29 DIAGNOSIS — Z20822 Contact with and (suspected) exposure to covid-19: Secondary | ICD-10-CM

## 2019-03-30 LAB — NOVEL CORONAVIRUS, NAA: SARS-CoV-2, NAA: NOT DETECTED

## 2019-03-31 ENCOUNTER — Other Ambulatory Visit: Payer: Medicaid Other

## 2019-04-24 ENCOUNTER — Ambulatory Visit (HOSPITAL_COMMUNITY)
Admission: EM | Admit: 2019-04-24 | Discharge: 2019-04-24 | Disposition: A | Discharge status: Home / Self Care | Payer: Medicaid Other | Attending: Family Medicine | Admitting: Family Medicine

## 2019-04-24 ENCOUNTER — Other Ambulatory Visit: Payer: Self-pay

## 2019-04-24 DIAGNOSIS — L03011 Cellulitis of right finger: Secondary | ICD-10-CM

## 2019-04-24 MED ORDER — SULFAMETHOXAZOLE-TRIMETHOPRIM 800-160 MG PO TABS: 1.0000 | ORAL_TABLET | Freq: Two times a day (BID) | ORAL | 0 refills | Status: AC

## 2019-04-24 MED ORDER — HYDROCODONE-ACETAMINOPHEN 5-325 MG PO TABS: 1.0000 | ORAL_TABLET | Freq: Four times a day (QID) | ORAL | 0 refills | Status: DC | PRN

## 2019-04-24 NOTE — Discharge Instructions (Addendum)
Take the antibiotic 2 x a day  Warm soaks 2 - 3 times a day Take tylenol or ibuprofen for mild pain Take the hydrocodone if pain is severe Return if needed

## 2019-04-24 NOTE — ED Triage Notes (Signed)
Patient reports swelling noted to right middle finger x 4 days, denies any injury, states pain and swelling has progressed-hurts to move or touch.

## 2019-04-27 NOTE — ED Provider Notes (Signed)
[facility]    CSN: [id number] Arrival date & time: 04/24/19  1412      History   Chief Complaint Chief Complaint  Patient presents with  . Finger Injury    HPI Kelsey Black is a 29 y.o. female.   HPI  Patient states that her right middle finger is very painful for the last 3 or 4 days.  Getting progressively more swollen and painful.  She has swelling and pain at the edge of her fingernail.  No known injury.  Past Medical History:  Diagnosis Date  . Anemia   . Family history of adverse reaction to anesthesia    mother woke up during abdominal surgery 3 years ago  . Nausea & vomiting    last month  . Pregnancy    19 weeks and few days michelle horvath ob dr  . Symptomatic cholelithiasis     Patient Active Problem List   Diagnosis Date Noted  . Indication for care in labor or delivery 03/22/2017  . Chronic cholecystitis 11/16/2016  . Bacterial vaginosis 08/01/2014    Past Surgical History:  Procedure Laterality Date  . CHOLECYSTECTOMY N/A 11/16/2016   Procedure: LAPAROSCOPIC CHOLECYSTECTOMY;  Surgeon: Andria Meuse, MD;  Location: [facility];  Service: General;  Laterality: N/A;  . TOOTH EXTRACTION      OB History    Gravida  1   Para  1   Term  1   Preterm  0   AB  0   Living  1     SAB  0   TAB  0   Ectopic  0   Multiple  0   Live Births  1            Home Medications    Prior to Admission medications   Medication Sig Start Date End Date Taking? Authorizing Provider  HYDROcodone-acetaminophen (NORCO/VICODIN) 5-325 MG tablet Take 1-2 tablets by mouth every 6 (six) hours as needed. 04/24/19   Eustace Moore, MD  PROAIR HFA 108 [phone] Base) MCG/ACT inhaler Inhale 2 puffs into the lungs every 6 (six) hours as needed for shortness of breath.  12/11/17   [provider]  sulfamethoxazole-trimethoprim (BACTRIM DS) 800-160 MG tablet Take 1 tablet by mouth 2 (two) times daily for 7 days. 04/24/19 05/01/19  Eustace Moore, MD  famotidine (PEPCID) 20 MG tablet Take 1 tablet (20 mg total) by mouth 2 (two) times daily. Patient not taking: Reported on 11/02/2017 11/06/16 04/24/19  Leftwich-Kirby, Wilmer Floor, CNM  promethazine (PHENERGAN) 25 MG tablet Take 0.5-1 tablets (12.5-25 mg total) by mouth every 6 (six) hours as needed. Patient not taking: Reported on 03/21/2017 11/06/16 04/24/19  Hurshel Party, CNM    Family History Family History  Problem Relation Age of Onset  . Asthma Mother   . Asthma Sister   . Asthma Brother   . Asthma Father   . Epilepsy Father   . Diabetes Father   . Diabetes Maternal Grandfather   . Hypertension Maternal Grandfather     Social History Social History   Tobacco Use  . Smoking status: Never Smoker  . Smokeless tobacco: Never Used  Substance Use Topics  . Alcohol use: No    Alcohol/week: 0.0 standard drinks  . Drug use: No     Allergies   Penicillins and Shrimp [shellfish allergy]   Review of Systems Review of Systems  Skin: Positive for wound.       Right middle  finger is painful     Physical Exam Triage Vital Signs ED Triage Vitals  Enc Vitals Group     BP 04/24/19 1438 115/72     Pulse Rate 04/24/19 1438 83     Resp 04/24/19 1438 17     Temp 04/24/19 1438 98.3 F (36.8 C)     Temp Source 04/24/19 1438 Oral     SpO2 04/24/19 1438 100 %     Weight --      Height --      Head Circumference --      Peak Flow --      Pain Score 04/24/19 1436 7     Pain Loc --      Pain Edu? --      Excl. in [place]? --    No data found.  Updated Vital Signs BP 115/72 (BP Location: Right Arm)   Pulse 83   Temp 98.3 F (36.8 C) (Oral)   Resp 17   LMP 04/17/2019   SpO2 100%   Physical Exam Constitutional:      General: She is not in acute distress.    Appearance: She is well-developed.  HENT:     Head: Normocephalic and atraumatic.     Mouth/Throat:     Comments: Mask is in place Eyes:     Conjunctiva/sclera: Conjunctivae normal.     Pupils:  Pupils are equal, round, and reactive to light.  Cardiovascular:     Rate and Rhythm: Normal rate.  Pulmonary:     Effort: Pulmonary effort is normal. No respiratory distress.  Musculoskeletal:        General: Normal range of motion.     Cervical back: Normal range of motion.  Skin:    General: Skin is warm and dry.     Comments: Right long finger has redness around the tip.  Very tender to touch.  The nail edge there is a sliver of yellow purulence visible.  This is opened with an 18-gauge needle with good expression of purulence  Neurological:     Mental Status: She is alert.  Psychiatric:        Mood and Affect: Mood normal.        Behavior: Behavior normal.      UC Treatments / Results  Labs (all labs ordered are listed, but only abnormal results are displayed) Labs Reviewed - No data to display  EKG   Radiology No results found.  Procedures Procedures (including critical care time)  Medications Ordered in UC Medications - No data to display  Initial Impression / Assessment and Plan / UC Course  I have reviewed the triage vital signs and the nursing notes.  Pertinent labs & imaging results that were available during my care of the patient were reviewed by me and considered in my medical decision making (see chart for details).     [place] subsequent home care, warm soaks, antibiotics.  Should resolve in a couple days Final Clinical Impressions(s) / UC Diagnoses   Final diagnoses:  Paronychia of right middle finger     Discharge Instructions     Take the antibiotic 2 x a day  Warm soaks 2 - 3 times a day Take tylenol or ibuprofen for mild pain Take the hydrocodone if pain is severe Return if needed   ED Prescriptions    Medication Sig Dispense Auth. Provider   sulfamethoxazole-trimethoprim (BACTRIM DS) 800-160 MG tablet Take 1 tablet by mouth 2 (two) times daily for 7 days.  14 tablet Eustace Moore, MD   HYDROcodone-acetaminophen (NORCO/VICODIN)  5-325 MG tablet Take 1-2 tablets by mouth every 6 (six) hours as needed. 10 tablet Eustace Moore, MD     I have reviewed the PDMP during this encounter.   Eustace Moore, MD 04/27/19 [phone]

## 2019-06-07 ENCOUNTER — Telehealth: Payer: Medicaid Other

## 2019-06-07 ENCOUNTER — Other Ambulatory Visit: Payer: Self-pay

## 2019-06-07 ENCOUNTER — Encounter (HOSPITAL_COMMUNITY): Payer: Self-pay | Admitting: Emergency Medicine

## 2019-06-07 ENCOUNTER — Emergency Department (HOSPITAL_COMMUNITY)
Admission: EM | Admit: 2019-06-07 | Discharge: 2019-06-07 | Disposition: A | Discharge status: Home / Self Care | Payer: Medicaid Other | Attending: Emergency Medicine | Admitting: Emergency Medicine

## 2019-06-07 DIAGNOSIS — R109 Unspecified abdominal pain: Secondary | ICD-10-CM | POA: Diagnosis not present

## 2019-06-07 DIAGNOSIS — R112 Nausea with vomiting, unspecified: Secondary | ICD-10-CM | POA: Diagnosis not present

## 2019-06-07 DIAGNOSIS — R197 Diarrhea, unspecified: Secondary | ICD-10-CM | POA: Insufficient documentation

## 2019-06-07 DIAGNOSIS — R5383 Other fatigue: Secondary | ICD-10-CM | POA: Insufficient documentation

## 2019-06-07 DIAGNOSIS — R1110 Vomiting, unspecified: Secondary | ICD-10-CM | POA: Diagnosis present

## 2019-06-07 LAB — COMPREHENSIVE METABOLIC PANEL
ALT: 14 U/L (ref 0–44)
AST: 17 U/L (ref 15–41)
Albumin: 3.6 g/dL (ref 3.5–5.0)
Alkaline Phosphatase: 64 U/L (ref 38–126)
Anion gap: 8 (ref 5–15)
BUN: 13 mg/dL (ref 6–20)
CO2: 26 mmol/L (ref 22–32)
Calcium: 8.7 mg/dL — ABNORMAL LOW (ref 8.9–10.3)
Chloride: 103 mmol/L (ref 98–111)
Creatinine, Ser: 0.67 mg/dL (ref 0.44–1.00)
GFR calc Af Amer: >60 mL/min (ref >60)
GFR calc non Af Amer: >60 mL/min (ref >60)
Glucose, Bld: 122 mg/dL — ABNORMAL HIGH (ref 70–99)
Potassium: 4.7 mmol/L (ref 3.5–5.1)
Sodium: 137 mmol/L (ref 135–145)
Total Bilirubin: 0.8 mg/dL (ref 0.3–1.2)
Total Protein: 7.3 g/dL (ref 6.5–8.1)

## 2019-06-07 LAB — URINALYSIS, ROUTINE W REFLEX MICROSCOPIC
Bilirubin Urine: NEGATIVE
Glucose, UA: NEGATIVE mg/dL
Ketones, ur: 80 mg/dL — AB
Nitrite: NEGATIVE
Protein, ur: 30 mg/dL — AB
Specific Gravity, Urine: 1.029 (ref 1.005–1.030)
pH: 5 (ref 5.0–8.0)

## 2019-06-07 LAB — CBC
HCT: 40.6 % (ref 36.0–46.0)
Hemoglobin: 12.3 g/dL (ref 12.0–15.0)
MCH: 25.3 pg — ABNORMAL LOW (ref 26.0–34.0)
MCHC: 30.3 g/dL (ref 30.0–36.0)
MCV: 83.4 fL (ref 80.0–100.0)
Platelets: 383 10*3/uL (ref 150–400)
RBC: 4.87 MIL/uL (ref 3.87–5.11)
RDW: 16.4 % — ABNORMAL HIGH (ref 11.5–15.5)
WBC: 10.9 10*3/uL — ABNORMAL HIGH (ref 4.0–10.5)
nRBC: 0 % (ref 0.0–0.2)

## 2019-06-07 LAB — LIPASE, BLOOD: Lipase: 18 U/L (ref 11–51)

## 2019-06-07 LAB — I-STAT BETA HCG BLOOD, ED (MC, WL, AP ONLY): I-stat hCG, quantitative: <5 m[IU]/mL (ref <5)

## 2019-06-07 MED ORDER — SODIUM CHLORIDE 0.9 % IV BOLUS
1000.0000 mL | Freq: Once | INTRAVENOUS | Status: AC
  Administered 2019-06-07: 08:00:00 1000 mL via INTRAVENOUS

## 2019-06-07 MED ORDER — SODIUM CHLORIDE 0.9% FLUSH: 3.0000 mL | Freq: Once | INTRAVENOUS | Status: DC

## 2019-06-07 MED ORDER — ALUM & MAG HYDROXIDE-SIMETH 200-200-20 MG/5ML PO SUSP
30.0000 mL | Freq: Once | ORAL | Status: AC
  Administered 2019-06-07: 30 mL via ORAL
  Filled 2019-06-07: qty 30

## 2019-06-07 MED ORDER — MORPHINE SULFATE (PF) 4 MG/ML IV SOLN
4.0000 mg | Freq: Once | INTRAVENOUS | Status: AC
  Administered 2019-06-07: 4 mg via INTRAVENOUS
  Filled 2019-06-07: qty 1

## 2019-06-07 MED ORDER — LIDOCAINE VISCOUS HCL 2 % MT SOLN
15.0000 mL | Freq: Once | OROMUCOSAL | Status: AC
  Administered 2019-06-07: 08:00:00 15 mL via ORAL
  Filled 2019-06-07: qty 15

## 2019-06-07 MED ORDER — ONDANSETRON HCL 4 MG PO TABS: 4.0000 mg | ORAL_TABLET | Freq: Three times a day (TID) | ORAL | 0 refills | Status: DC | PRN

## 2019-06-07 MED ORDER — ONDANSETRON HCL 4 MG/2ML IJ SOLN
4.0000 mg | Freq: Once | INTRAMUSCULAR | Status: AC
  Administered 2019-06-07: 08:00:00 4 mg via INTRAVENOUS
  Filled 2019-06-07: qty 2

## 2019-06-07 NOTE — ED Triage Notes (Signed)
Pt states she is been vomiting since yesterday morning not able to tolerate any fluids, last vomit with blood.
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
## 2019-06-07 NOTE — Discharge Instructions (Signed)
Your history and exam today are consistent with viral gastroenteritis causing her nausea, vomiting, diarrhea, and discomfort.  After rehydration, we feel you are safe for discharge home given your stable vital signs.  Please use the nausea medicine to maintain hydration and rest.  Please observe good handwashing and hand hygiene with family.  Please follow-up with a primary doctor.  If any symptoms change or worsen acutely, please return to the nearest emergency department.

## 2019-06-09 LAB — URINE CULTURE

## 2020-08-25 DIAGNOSIS — Y92410 Unspecified street and highway as the place of occurrence of the external cause: Secondary | ICD-10-CM | POA: Insufficient documentation

## 2020-08-25 DIAGNOSIS — M62838 Other muscle spasm: Secondary | ICD-10-CM | POA: Diagnosis not present

## 2020-08-25 DIAGNOSIS — R519 Headache, unspecified: Secondary | ICD-10-CM | POA: Diagnosis present

## 2020-08-26 ENCOUNTER — Emergency Department (HOSPITAL_COMMUNITY)
Admission: EM | Admit: 2020-08-26 | Discharge: 2020-08-26 | Disposition: A | Discharge status: Home / Self Care | Payer: Medicaid Other | Attending: Emergency Medicine | Admitting: Emergency Medicine

## 2020-08-26 MED ORDER — CYCLOBENZAPRINE HCL 10 MG PO TABS: 10.0000 mg | ORAL_TABLET | Freq: Three times a day (TID) | ORAL | 0 refills | Status: DC | PRN

## 2020-08-26 MED ORDER — IBUPROFEN 800 MG PO TABS: 800.0000 mg | ORAL_TABLET | Freq: Three times a day (TID) | ORAL | 0 refills | Status: DC
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
## 2020-12-23 ENCOUNTER — Encounter (INDEPENDENT_AMBULATORY_CARE_PROVIDER_SITE_OTHER): Payer: Self-pay

## 2021-01-14 ENCOUNTER — Other Ambulatory Visit: Payer: Self-pay

## 2021-01-14 ENCOUNTER — Emergency Department (HOSPITAL_COMMUNITY)
Admission: EM | Admit: 2021-01-14 | Discharge: 2021-01-14 | Disposition: A | Discharge status: Home / Self Care | Payer: Medicaid Other | Attending: Emergency Medicine | Admitting: Emergency Medicine

## 2021-01-14 ENCOUNTER — Encounter (HOSPITAL_COMMUNITY): Payer: Self-pay | Admitting: Emergency Medicine

## 2021-01-14 DIAGNOSIS — R519 Headache, unspecified: Secondary | ICD-10-CM | POA: Diagnosis present

## 2021-01-14 DIAGNOSIS — B348 Other viral infections of unspecified site: Secondary | ICD-10-CM | POA: Diagnosis not present

## 2021-01-14 DIAGNOSIS — Z20822 Contact with and (suspected) exposure to covid-19: Secondary | ICD-10-CM | POA: Diagnosis not present

## 2021-01-14 DIAGNOSIS — B349 Viral infection, unspecified: Secondary | ICD-10-CM

## 2021-01-14 LAB — RESP PANEL BY RT-PCR (FLU A&B, COVID) ARPGX2
Influenza A by PCR: NEGATIVE
Influenza B by PCR: NEGATIVE
SARS Coronavirus 2 by RT PCR: NEGATIVE

## 2021-01-14 LAB — GROUP A STREP BY PCR: Group A Strep by PCR: NOT DETECTED

## 2021-01-14 MED ORDER — FLUTICASONE PROPIONATE 50 MCG/ACT NA SUSP: 2.0000 | Freq: Every day | NASAL | 0 refills | Status: DC

## 2021-01-14 MED ORDER — BENZONATATE 100 MG PO CAPS: 100.0000 mg | ORAL_CAPSULE | Freq: Three times a day (TID) | ORAL | 0 refills | Status: DC | PRN

## 2021-01-14 NOTE — ED Triage Notes (Signed)
Pt reports sore throat, rib pain, body aches, chills, and congestion x 1 week.

## 2021-01-14 NOTE — ED Provider Notes (Signed)
[facility] EMERGENCY DEPARTMENT Provider Note   CSN: [id number] Arrival date & time: 01/14/21  1238     History No chief complaint on file.   Kelsey Black is a 30 y.o. female presents to the emergency department with flulike symptoms for the past week.  Symptoms include sinus headache, dry cough, sore throat, body aches, rhinorrhea, and nasal congestion.  Patient report family members have similar symptoms.  She has tried NyQuil with minimal relief.  She denies any fever, chest pain, shortness of breath, ear pain, abdominal pain, nausea, or vomiting.  Denies any medical history.  Surgical history includes cholecystectomy.  Allergic to penicillin.  Denies any tobacco, EtOH, or illicit drug use.  HPI     Past Medical History:  Diagnosis Date   Anemia    Family history of adverse reaction to anesthesia    mother woke up during abdominal surgery 3 years ago   Nausea & vomiting    last month   Pregnancy    19 weeks and few days michelle horvath ob dr   Symptomatic cholelithiasis     Patient Active Problem List   Diagnosis Date Noted   Indication for care in labor or delivery 03/22/2017   Chronic cholecystitis 11/16/2016   Bacterial vaginosis 08/01/2014    Past Surgical History:  Procedure Laterality Date   CHOLECYSTECTOMY N/A 11/16/2016   Procedure: LAPAROSCOPIC CHOLECYSTECTOMY;  Surgeon: Ileana Roup, MD;  Location: [facility];  Service: General;  Laterality: N/A;   TOOTH EXTRACTION       OB History     Gravida  1   Para  1   Term  1   Preterm  0   AB  0   Living  1      SAB  0   IAB  0   Ectopic  0   Multiple  0   Live Births  1           Family History  Problem Relation Age of Onset   Asthma Mother    Asthma Sister    Asthma Brother    Asthma Father    Epilepsy Father    Diabetes Father    Diabetes Maternal Grandfather    Hypertension Maternal Grandfather     Social History   Tobacco Use    Smoking status: Never   Smokeless tobacco: Never  Vaping Use   Vaping Use: Never used  Substance Use Topics   Alcohol use: Yes   Drug use: No    Home Medications Prior to Admission medications   Medication Sig Start Date End Date Taking? Authorizing Provider  cyclobenzaprine (FLEXERIL) 10 MG tablet Take 1 tablet (10 mg total) by mouth 3 (three) times daily as needed for muscle spasms. 08/26/20   Montine Circle, PA-C  HYDROcodone-acetaminophen (NORCO/VICODIN) 5-325 MG tablet Take 1-2 tablets by mouth every 6 (six) hours as needed. Patient not taking: Reported on 06/07/2019 04/24/19   Raylene Everts, MD  ibuprofen (ADVIL) 800 MG tablet Take 1 tablet (800 mg total) by mouth 3 (three) times daily. 08/26/20   Montine Circle, PA-C  ondansetron (ZOFRAN) 4 MG tablet Take 1 tablet (4 mg total) by mouth every 8 (eight) hours as needed for nausea or vomiting. 06/07/19   Tegeler, Gwenyth Allegra, MD  famotidine (PEPCID) 20 MG tablet Take 1 tablet (20 mg total) by mouth 2 (two) times daily. Patient not taking: Reported on 11/02/2017 11/06/16 04/24/19  Elvera Maria, CNM  promethazine (  PHENERGAN) 25 MG tablet Take 0.5-1 tablets (12.5-25 mg total) by mouth every 6 (six) hours as needed. Patient not taking: Reported on 03/21/2017 11/06/16 04/24/19  Hurshel Party, CNM    Allergies    Penicillins and Shrimp [shellfish allergy]  Review of Systems   Review of Systems  Constitutional:  Negative for chills and fever.  HENT:  Positive for congestion, rhinorrhea, sinus pressure and sore throat. Negative for drooling, ear pain and trouble swallowing.   Eyes:  Negative for photophobia, discharge and visual disturbance.  Respiratory:  Positive for cough. Negative for shortness of breath.   Cardiovascular:  Negative for chest pain and palpitations.  Gastrointestinal:  Negative for abdominal pain, diarrhea, nausea and vomiting.  Genitourinary:  Negative for dysuria and hematuria.  Musculoskeletal:   Positive for myalgias. Negative for arthralgias, back pain and joint swelling.  Skin:  Negative for color change and rash.  Neurological:  Positive for headaches. Negative for syncope and weakness.   Physical Exam Updated Vital Signs BP 112/72 (BP Location: Left Arm)   Pulse 81   Temp 98.2 F (36.8 C) (Oral)   Resp 20   LMP 01/04/2021   SpO2 100%   Physical Exam Vitals and nursing note reviewed.  Constitutional:      General: She is not in acute distress.    Appearance: She is not toxic-appearing.  HENT:     Head: Normocephalic and atraumatic.     Right Ear: Tympanic membrane, ear canal and external ear normal.     Left Ear: Tympanic membrane, ear canal and external ear normal.     Nose:     Comments: Bilateral nasal turbinate edema and erythema with scant clear nasal discharge.    Mouth/Throat:     Mouth: Mucous membranes are moist.     Comments: No pharyngeal erythema, exudate, or edema noted.  Uvula midline.  Airway patent.  Moist mucous membranes. Eyes:     General: No scleral icterus.    Conjunctiva/sclera: Conjunctivae normal.  Cardiovascular:     Rate and Rhythm: Normal rate.  Pulmonary:     Effort: Pulmonary effort is normal. No respiratory distress.  Abdominal:     General: Abdomen is flat.     Palpations: Abdomen is soft.  Musculoskeletal:        General: No deformity.     Cervical back: Normal range of motion.  Skin:    Findings: No rash.  Neurological:     General: No focal deficit present.     Mental Status: She is alert.    ED Results / Procedures / Treatments   Labs (all labs ordered are listed, but only abnormal results are displayed) Labs Reviewed  RESP PANEL BY RT-PCR (FLU A&B, COVID) [id number]  GROUP A STREP BY PCR    EKG None  Radiology No results found.  Procedures Procedures   Medications Ordered in ED Medications - No data to display  ED Course  I have reviewed the triage vital signs and the nursing notes.  Pertinent labs &  imaging results that were available during my care of the patient were reviewed by me and considered in my medical decision making (see chart for details).  30 year old female otherwise healthy presents emerged department flulike symptoms the past week.  Negative for COVID and flu.  Vital signs stable.  Patient is afebrile in the emergency department.  Satting 100% on room air.  Will obtain strep test due to patient sore throat.  Discussed negative COVID and flu  results with patient.  Discussed the need for strep test.  In the meantime, I recommended supportive care measures staying well rested and hydrated along with using over-the-counter cough and cold medication.  Strict return precautions discussed.  Awaiting strep results.  Will handoff patient.  7:57 PM Care of Beyla Schallhorn Black  transferred to Dr. Laverta Baltimore at the end of my shift as the patient will require reassessment once labs/imaging have resulted. Patient presentation, ED course, and plan of care discussed with review of all pertinent labs and imaging. Please see his/her note for further details regarding further ED course and disposition. Plan at time of handoff is pending strep results, if the patient is strep positive, send home on a non-PCN antibiotic. If negative, send home on supportive care measures. This may be altered or completely changed at the discretion of the oncoming team pending results of further workup.      MDM Rules/Calculators/A&P                          Final Clinical Impression(s) / ED Diagnoses Final diagnoses:  Viral illness    Rx / DC Orders ED Discharge Orders     None        Sherrell Puller, Hershal Coria 01/14/21 1957    Margette Fast, MD 01/14/21 2046

## 2021-01-14 NOTE — ED Notes (Signed)
Pt provided discharge instructions and prescription information. Pt was given the opportunity to ask questions and questions were answered. Discharge signature not obtained in the setting of the COVID-19 pandemic in order to reduce high touch surfaces.  ° °

## 2021-01-14 NOTE — Discharge Instructions (Addendum)
You had a negative COVID and flu test.  For viral illnesses, I recommend staying well-hydrated and obtaining plenty of rest.  You can take over-the-counter cough and cold medication.  Beware with taking these with ibuprofen and Tylenol as these usually contain these active ingredients and will cause overdose.  More information on viral illnesses, additional information included in this discharge paperwork.  If you have any concern, new or worsening symptoms, please return to the emergency department for reevaluation.

## 2021-01-14 NOTE — ED Provider Notes (Signed)
Emergency Medicine Provider Triage Evaluation Note  Kelsey Black , a 30 y.o. female  was evaluated in triage.  Pt complains of sore throat, rib pain, body aches, chills, nonproductive cough, nasal congestion for 1 week.  States her whole family is sick, but everyone is overall doing well.  She is tolerating p.o.  States she has some intermittent shortness of breath, worse with coughing.  Has been trying Sudafed and NyQuil with minimal relief.  Review of Systems  Positive: Sore throat, bilateral rib pain, body aches, chills, nonproductive cough, nasal congestion Negative: Nausea, vomiting, diarrhea, abdominal pain, chest pain  Physical Exam  BP 118/69   Pulse 85   Temp 98.7 F (37.1 C) (Oral)   Resp 17   LMP 01/04/2021   SpO2 100%  Gen:   Awake, no distress   Resp:  Normal effort  MSK:   Moves extremities without difficulty  Other:    Medical Decision Making  Medically screening exam initiated at 3:29 PM.  Appropriate orders placed.  Kelsey Black was informed that the remainder of the evaluation will be completed by another provider, this initial triage assessment does not replace that evaluation, and the importance of remaining in the ED until their evaluation is complete.     Kelsey Black 01/14/21 1535    Gerhard Munch, MD 01/18/21 1426

## 2021-02-16 HISTORY — PX: COLONOSCOPY W/ POLYPECTOMY: SHX1380

## 2021-03-12 ENCOUNTER — Telehealth: Payer: Self-pay | Admitting: Genetic Counselor

## 2021-03-12 NOTE — Telephone Encounter (Signed)
Scheduled appt per 1/18 referral. Spoke to pt who is aware of appt date and time.

## 2021-03-18 DIAGNOSIS — N946 Dysmenorrhea, unspecified: Secondary | ICD-10-CM | POA: Insufficient documentation

## 2021-03-19 ENCOUNTER — Emergency Department (HOSPITAL_COMMUNITY)
Admission: EM | Admit: 2021-03-19 | Discharge: 2021-03-19 | Disposition: A | Discharge status: Home / Self Care | Payer: Medicaid Other | Attending: Emergency Medicine | Admitting: Emergency Medicine

## 2021-03-19 ENCOUNTER — Emergency Department (HOSPITAL_COMMUNITY): Payer: Medicaid Other

## 2021-03-19 ENCOUNTER — Encounter (HOSPITAL_COMMUNITY): Payer: Self-pay | Admitting: Emergency Medicine

## 2021-03-19 DIAGNOSIS — N83202 Unspecified ovarian cyst, left side: Secondary | ICD-10-CM | POA: Insufficient documentation

## 2021-03-19 DIAGNOSIS — R42 Dizziness and giddiness: Secondary | ICD-10-CM | POA: Insufficient documentation

## 2021-03-19 DIAGNOSIS — R102 Pelvic and perineal pain: Secondary | ICD-10-CM | POA: Diagnosis present

## 2021-03-19 DIAGNOSIS — K644 Residual hemorrhoidal skin tags: Secondary | ICD-10-CM | POA: Insufficient documentation

## 2021-03-19 DIAGNOSIS — K649 Unspecified hemorrhoids: Secondary | ICD-10-CM

## 2021-03-19 LAB — COMPREHENSIVE METABOLIC PANEL
ALT: 13 U/L (ref 0–44)
AST: 15 U/L (ref 15–41)
Albumin: 3.5 g/dL (ref 3.5–5.0)
Alkaline Phosphatase: 59 U/L (ref 38–126)
Anion gap: 8 (ref 5–15)
BUN: 6 mg/dL (ref 6–20)
CO2: 26 mmol/L (ref 22–32)
Calcium: 8.7 mg/dL — ABNORMAL LOW (ref 8.9–10.3)
Chloride: 102 mmol/L (ref 98–111)
Creatinine, Ser: 0.66 mg/dL (ref 0.44–1.00)
GFR, Estimated: >60 mL/min (ref >60)
Glucose, Bld: 89 mg/dL (ref 70–99)
Potassium: 4.1 mmol/L (ref 3.5–5.1)
Sodium: 136 mmol/L (ref 135–145)
Total Bilirubin: 1 mg/dL (ref 0.3–1.2)
Total Protein: 6.8 g/dL (ref 6.5–8.1)

## 2021-03-19 LAB — CBC WITH DIFFERENTIAL/PLATELET
Abs Immature Granulocytes: 0.01 10*3/uL (ref 0.00–0.07)
Basophils Absolute: 0 10*3/uL (ref 0.0–0.1)
Basophils Relative: 0 %
Eosinophils Absolute: 0.1 10*3/uL (ref 0.0–0.5)
Eosinophils Relative: 2 %
HCT: 36 % (ref 36.0–46.0)
Hemoglobin: 11.1 g/dL — ABNORMAL LOW (ref 12.0–15.0)
Immature Granulocytes: 0 %
Lymphocytes Relative: 34 %
Lymphs Abs: 2.5 10*3/uL (ref 0.7–4.0)
MCH: 26.7 pg (ref 26.0–34.0)
MCHC: 30.8 g/dL (ref 30.0–36.0)
MCV: 86.5 fL (ref 80.0–100.0)
Monocytes Absolute: 0.4 10*3/uL (ref 0.1–1.0)
Monocytes Relative: 6 %
Neutro Abs: 4.1 10*3/uL (ref 1.7–7.7)
Neutrophils Relative %: 58 %
Platelets: 353 10*3/uL (ref 150–400)
RBC: 4.16 MIL/uL (ref 3.87–5.11)
RDW: 14.9 % (ref 11.5–15.5)
WBC: 7.2 10*3/uL (ref 4.0–10.5)
nRBC: 0 % (ref 0.0–0.2)

## 2021-03-19 LAB — URINALYSIS, ROUTINE W REFLEX MICROSCOPIC
Bilirubin Urine: NEGATIVE
Glucose, UA: NEGATIVE mg/dL
Ketones, ur: >80 mg/dL — AB
Leukocytes,Ua: NEGATIVE
Nitrite: NEGATIVE
Protein, ur: NEGATIVE mg/dL
Specific Gravity, Urine: 1.015 (ref 1.005–1.030)
pH: 6 (ref 5.0–8.0)

## 2021-03-19 LAB — URINALYSIS, MICROSCOPIC (REFLEX)

## 2021-03-19 LAB — I-STAT BETA HCG BLOOD, ED (MC, WL, AP ONLY): I-stat hCG, quantitative: <5 m[IU]/mL (ref <5)

## 2021-03-19 LAB — WET PREP, GENITAL
Clue Cells Wet Prep HPF POC: NONE SEEN
Sperm: NONE SEEN
Trich, Wet Prep: NONE SEEN
WBC, Wet Prep HPF POC: >=10 — AB (ref <10)
Yeast Wet Prep HPF POC: NONE SEEN

## 2021-03-19 LAB — LIPASE, BLOOD: Lipase: 23 U/L (ref 11–51)

## 2021-03-19 MED ORDER — LACTATED RINGERS IV BOLUS
1000.0000 mL | Freq: Once | INTRAVENOUS | Status: AC
  Administered 2021-03-19: 1000 mL via INTRAVENOUS

## 2021-03-19 MED ORDER — ONDANSETRON HCL 4 MG/2ML IJ SOLN
4.0000 mg | Freq: Once | INTRAMUSCULAR | Status: AC
  Administered 2021-03-19: 4 mg via INTRAVENOUS
  Filled 2021-03-19: qty 2

## 2021-03-19 MED ORDER — HYDROMORPHONE HCL 1 MG/ML IJ SOLN
1.0000 mg | Freq: Once | INTRAMUSCULAR | Status: AC
  Administered 2021-03-19: 1 mg via INTRAVENOUS
  Filled 2021-03-19: qty 1

## 2021-03-19 MED ORDER — KETOROLAC TROMETHAMINE 15 MG/ML IJ SOLN
15.0000 mg | Freq: Once | INTRAMUSCULAR | Status: AC
  Administered 2021-03-19: 15 mg via INTRAVENOUS
  Filled 2021-03-19: qty 1

## 2021-03-19 MED ORDER — IOHEXOL 300 MG/ML  SOLN
100.0000 mL | Freq: Once | INTRAMUSCULAR | Status: AC | PRN
  Administered 2021-03-19: 100 mL via INTRAVENOUS

## 2021-03-19 NOTE — ED Provider Notes (Signed)
[facility] EMERGENCY DEPARTMENT Provider Note   CSN: [id number] Arrival date & time: 03/19/21  1228     History  Chief Complaint  Patient presents with   Dizziness    Kelsey Black is a 31 y.o. female.   Dizziness Associated symptoms: nausea    31 year old female with a history of symptomatic cholelithiasis status postcholecystectomy, hemorrhoids status post banding with Dr. Grandville Silos earlier this morning in clinic, dysmenorrhea, follows with OB/GYN outpatient, recent colonoscopy earlier this week (removal of a Black polyp who presents to the emergency department with abdominal pain.  The patient states that she had a colonoscopy on Monday and had a polyp removed.  She had no problems immediately post colonoscopy the next day developed back pain and subsequent abdominal pain that radiated throughout her lower abdomen.  Pain has been progressively worsening.  She was initially seen by her PCP and put on antibiotics for a presumptive UTI and stated that the antibiotics helped briefly with the pain but states that the pain is now worsening.  She denies any fevers or chills.  Continues she continues to endorse mild discomfort after urinating but denies any dysuria.  Additionally, she states that she has had some rectal bleeding which has been attributed to her hemorrhoids which were banded this morning in clinic by general surgery.  Bleeding has been a light amount in the toilet daily and has been chronic for her.  States that she received her colonoscopy due to a strong family history of Black cancer therefore received screening early.  She endorses persistent nausea, denies any vomiting.  Home Medications Prior to Admission medications   Medication Sig Start Date End Date Taking? Authorizing Provider  benzonatate (TESSALON) 100 MG capsule Take 1 capsule (100 mg total) by mouth 3 (three) times daily as needed for cough. 01/14/21   Long, Wonda Olds, MD  cyclobenzaprine  (FLEXERIL) 10 MG tablet Take 1 tablet (10 mg total) by mouth 3 (three) times daily as needed for muscle spasms. 08/26/20   Montine Circle, PA-C  fluticasone (FLONASE) 50 MCG/ACT nasal spray Place 2 sprays into both nostrils daily for 7 days. 01/14/21 01/21/21  Long, Wonda Olds, MD  HYDROcodone-acetaminophen (NORCO/VICODIN) 5-325 MG tablet Take 1-2 tablets by mouth every 6 (six) hours as needed. Patient not taking: Reported on 06/07/2019 04/24/19   Raylene Everts, MD  ibuprofen (ADVIL) 800 MG tablet Take 1 tablet (800 mg total) by mouth 3 (three) times daily. 08/26/20   Montine Circle, PA-C  ondansetron (ZOFRAN) 4 MG tablet Take 1 tablet (4 mg total) by mouth every 8 (eight) hours as needed for nausea or vomiting. 06/07/19   Tegeler, Gwenyth Allegra, MD  famotidine (PEPCID) 20 MG tablet Take 1 tablet (20 mg total) by mouth 2 (two) times daily. Patient not taking: Reported on 11/02/2017 11/06/16 04/24/19  Leftwich-Kirby, Kathie Dike, CNM  promethazine (PHENERGAN) 25 MG tablet Take 0.5-1 tablets (12.5-25 mg total) by mouth every 6 (six) hours as needed. Patient not taking: Reported on 03/21/2017 11/06/16 04/24/19  Elvera Maria, CNM      Allergies    Penicillins and Shrimp [shellfish allergy]    Review of Systems   Review of Systems  Gastrointestinal:  Positive for abdominal pain and nausea.  Neurological:  Positive for dizziness.  All other systems reviewed and are negative.  Physical Exam Updated Vital Signs BP 114/67    Pulse 79    Temp 98.2 F (36.8 C) (Oral)    Resp 18  SpO2 100%  Physical Exam Vitals and nursing note reviewed. Exam conducted with a chaperone present.  Constitutional:      General: She is not in acute distress.    Appearance: She is well-developed.  HENT:     Head: Normocephalic and atraumatic.  Eyes:     Conjunctiva/sclera: Conjunctivae normal.     Pupils: Pupils are equal, round, and reactive to light.  Cardiovascular:     Rate and Rhythm: Normal rate and regular  rhythm.     Heart sounds: No murmur heard. Pulmonary:     Effort: Pulmonary effort is normal. No respiratory distress.     Breath sounds: Normal breath sounds.  Abdominal:     General: There is no distension.     Palpations: Abdomen is soft.     Tenderness: There is abdominal tenderness in the right lower quadrant, suprapubic area and left lower quadrant. There is no guarding or rebound.  Genitourinary:    Cervix: Cervical motion tenderness present.     Adnexa: Right adnexa normal and left adnexa normal.     Comments: External hemorrhoids present status post banding.  No active bleeding noted Musculoskeletal:        General: No swelling, deformity or signs of injury.     Cervical back: Neck supple.  Skin:    General: Skin is warm and dry.     Capillary Refill: Capillary refill takes less than 2 seconds.     Findings: No lesion or rash.  Neurological:     General: No focal deficit present.     Mental Status: She is alert. Mental status is at baseline.  Psychiatric:        Mood and Affect: Mood normal.    ED Results / Procedures / Treatments   Labs (all labs ordered are listed, but only abnormal results are displayed) Labs Reviewed  WET PREP, GENITAL - Abnormal; Notable for the following components:      Result Value   WBC, Wet Prep HPF POC >=10 (*)    All other components within normal limits  CBC WITH DIFFERENTIAL/PLATELET - Abnormal; Notable for the following components:   Hemoglobin 11.1 (*)    All other components within normal limits  COMPREHENSIVE METABOLIC PANEL - Abnormal; Notable for the following components:   Calcium 8.7 (*)    All other components within normal limits  URINALYSIS, ROUTINE W REFLEX MICROSCOPIC - Abnormal; Notable for the following components:   Hgb urine dipstick TRACE (*)    Ketones, ur >80 (*)    All other components within normal limits  URINALYSIS, MICROSCOPIC (REFLEX) - Abnormal; Notable for the following components:   Bacteria, UA RARE  (*)    All other components within normal limits  LIPASE, BLOOD  I-STAT BETA HCG BLOOD, ED ([facility], [facility], [facility] ONLY)  GC/CHLAMYDIA PROBE AMP ([place]) NOT AT [facility]    EKG None  Radiology CT ABDOMEN PELVIS W CONTRAST  Result Date: 03/19/2021 CLINICAL DATA:  Dizziness, back pain, abdominal pain, colonoscopy 2 days ago EXAM: CT ABDOMEN AND PELVIS WITH CONTRAST TECHNIQUE: Multidetector CT imaging of the abdomen and pelvis was performed using the standard protocol following bolus administration of intravenous contrast. RADIATION DOSE REDUCTION: This exam was performed according to the departmental dose-optimization program which includes automated exposure control, adjustment of the mA and/or kV according to patient size and/or use of iterative reconstruction technique. CONTRAST:  128mL OMNIPAQUE IOHEXOL 300 MG/ML  SOLN COMPARISON:  None. FINDINGS: Lower chest: No acute pleural or parenchymal  lung disease. Hepatobiliary: No focal liver abnormality is seen. Status post cholecystectomy. No biliary dilatation. Pancreas: Unremarkable. No pancreatic ductal dilatation or surrounding inflammatory changes. Spleen: Normal in size without focal abnormality. Adrenals/Urinary Tract: Adrenal glands are unremarkable. Kidneys are normal, without renal calculi, focal lesion, or hydronephrosis. Bladder is unremarkable. Stomach/Bowel: No bowel obstruction or ileus. Normal appendix right lower quadrant. No bowel wall thickening or inflammatory change. Vascular/Lymphatic: No significant vascular findings are present. No enlarged abdominal or pelvic lymph nodes. Reproductive: Uterus and bilateral adnexa are unremarkable. Other: Trace pelvic free fluid is likely physiologic. No free intraperitoneal gas. No abdominal wall hernia. Musculoskeletal: No acute or destructive bony lesions. Reconstructed images demonstrate no additional findings. IMPRESSION: 1. No acute intra-abdominal or intrapelvic process. 2. Trace pelvic free fluid, likely  physiologic. Electronically Signed   By: Randa Ngo M.D.   On: 03/19/2021 17:39   US PELVIC COMPLETE W TRANSVAGINAL AND TORSION R/O  Result Date: 03/19/2021 CLINICAL DATA:  Pelvic pain EXAM: TRANSABDOMINAL AND TRANSVAGINAL ULTRASOUND OF PELVIS DOPPLER ULTRASOUND OF OVARIES TECHNIQUE: Both transabdominal and transvaginal ultrasound examinations of the pelvis were performed. Transabdominal technique was performed for global imaging of the pelvis including uterus, ovaries, adnexal regions, and pelvic cul-de-sac. It was necessary to proceed with endovaginal exam following the transabdominal exam to visualize the adnexal structures. Color and duplex Doppler ultrasound was utilized to evaluate blood flow to the ovaries. COMPARISON:  03/19/2021 FINDINGS: Uterus Measurements: 9.7 x 4.3 x 5.9 cm = volume: 130 mL. No fibroids or other mass visualized. Endometrium Thickness: 13 mm.  No focal abnormality visualized. Right ovary Measurements: 3.3 x 2.3 x 3.2 cm = volume: 12.7 mL. Normal appearance/no adnexal mass. Left ovary Measurements: 3.5 x 2.0 x 2.6 cm = volume: 9.7 mL. 1.6 x 1.4 x 1.9 cm minimally complex left ovarian cyst most consistent with hemorrhagic cyst. No other adnexal masses. Pulsed Doppler evaluation of both ovaries demonstrates normal low-resistance arterial and venous waveforms. Other findings Trace pelvic free fluid. IMPRESSION: 1. Minimally complex 1.9 cm left ovarian cyst, most consistent with hemorrhagic cyst or ruptured follicle. 2. Trace pelvic free fluid, likely physiologic. 3. Otherwise unremarkable pelvic ultrasound. Electronically Signed   By: Randa Ngo M.D.   On: 03/19/2021 20:33    Procedures Procedures    Medications Ordered in ED Medications  lactated ringers bolus 1,000 mL (0 mLs Intravenous Stopped 03/19/21 2113)  ondansetron (ZOFRAN) injection 4 mg (4 mg Intravenous Given 03/19/21 1743)  HYDROmorphone (DILAUDID) injection 1 mg (1 mg Intravenous Given 03/19/21 1744)  iohexol  (OMNIPAQUE) 300 MG/ML solution 100 mL (100 mLs Intravenous Contrast Given 03/19/21 1730)  ketorolac (TORADOL) 15 MG/ML injection 15 mg (15 mg Intravenous Given 03/19/21 2111)  HYDROmorphone (DILAUDID) injection 1 mg (1 mg Intravenous Given 03/19/21 2217)    ED Course/ Medical Decision Making/ A&P                           Medical Decision Making Amount and/or Complexity of Data Reviewed Labs: ordered. Radiology: ordered. ECG/medicine tests: ordered.  Risk Prescription drug management.   31 year old female with a history of symptomatic cholelithiasis status postcholecystectomy, hemorrhoids status post banding with Dr. Grandville Silos earlier this morning in clinic, dysmenorrhea, follows with OB/GYN outpatient, recent colonoscopy earlier this week (removal of a Black polyp who presents to the emergency department with abdominal pain.  The patient states that she had a colonoscopy on Monday and had a polyp removed.  She had no problems immediately  post colonoscopy the next day developed back pain and subsequent abdominal pain that radiated throughout her lower abdomen.  Pain has been progressively worsening.  She was initially seen by her PCP and put on antibiotics for a presumptive UTI and stated that the antibiotics helped briefly with the pain but states that the pain is now worsening.  She denies any fevers or chills.  Continues she continues to endorse mild discomfort after urinating but denies any dysuria.  Additionally, she states that she has had some rectal bleeding which has been attributed to her hemorrhoids which were banded this morning in clinic by general surgery.  Bleeding has been a light amount in the toilet daily and has been chronic for her.  States that she received her colonoscopy due to a strong family history of Black cancer therefore received screening early.  She endorses persistent nausea, denies any vomiting.  On arrival, the patient was afebrile, hemodynamically stable.  No SIRS  criteria met.  Low concern for intra-abdominal infection at this time.  Possible etiology of the patient's discomfort post colonoscopy is bowel perforation.  The patient's abdomen was overall nonperitoneal ache with generalized lower quadrant tenderness palpation.  External hemorrhoids were present status post banding with no evidence of active bleeding.  Further evaluation was  pursued via CT abdomen pelvis with contrast which revealed no acute intra-abdominal or intrapelvic process.  Trace pelvic free fluid, likely physiologic.  No free air noted.  Screening laboratory work-up initiated to include a urinalysis without evidence of UTI, CMP unremarkable, CBC without a leukocytosis, mild anemia to 11.1.  A pelvic exam was performed which revealed mild cervical motion tenderness, no adnexal significant tenderness appreciated.  A wet prep was performed which was negative for trichomonas, yeast, clue cells.  GC/committee was collected and negative.  Pelvic ultrasound was performed to further investigate and revealed a minimally complex cyst on the left which is likely hemorrhagic and has ruptured causing some free fluid.  This is likely the etiology of the patient's discomfort.  On reassessment, the patient was overall well-appearing with improvement in pain control.  She was advised NSAIDs for continued pain control at home.  Overall stable with the plan for close outpatient follow-up with GYN for repeat assessment.    DC Instructions: Your exam/testing today was overall reassuring.  Your CT of the abdomen pelvis revealed no acute abnormalities.  Your ultrasound did reveal evidence of an ovarian cyst that is likely ruptured with resultant free fluid in the pelvis.  Recommend routine follow-up with your OB/GYN.  Ibuprofen for pain control.   Final Clinical Impression(s) / ED Diagnoses Final diagnoses:  Pelvic pain  Cyst of left ovary  Hemorrhoids, unspecified hemorrhoid type    Rx / DC Orders ED  Discharge Orders     None         Regan Lemming, MD 03/21/21 1125

## 2021-03-19 NOTE — ED Provider Triage Note (Signed)
Emergency Medicine Provider Triage Evaluation Note  Kelsey Black , a 31 y.o. female  was evaluated in triage.  Pt complains of abdominal pain after recent colonoscopy.  This was her first colonoscopy.  She received this due to screening for Black cancer due to strong family history.  She has felt pain and nausea since the procedure.  No vomiting or fevers.  States that she took a treatment for UTI that helped, but has not resolved symptoms.  She does not have any irritative UTI symptoms.    Review of Systems  Positive: Abdominal pain Negative: Fever  Physical Exam  BP 115/71 (BP Location: Right Arm)    Pulse 78    Temp 98.3 F (36.8 C) (Oral)    Resp 16    SpO2 100%  Gen:   Awake, no distress   Resp:  Normal effort  MSK:   Moves extremities without difficulty  Other:  Abdomen soft, no rebound or guarding  Medical Decision Making  Medically screening exam initiated at 1:50 PM.  Appropriate orders placed.  Kelsey Black was informed that the remainder of the evaluation will be completed by another provider, this initial triage assessment does not replace that evaluation, and the importance of remaining in the ED until their evaluation is complete.     Renne Crigler, PA-C 03/19/21 1351

## 2021-03-19 NOTE — Discharge Instructions (Addendum)
You were evaluated in the Emergency Department and after careful evaluation, we did not find any emergent condition requiring admission or further testing in the hospital.  Your exam/testing today was overall reassuring.  Your CT of the abdomen pelvis revealed no acute abnormalities.  Your ultrasound did reveal evidence of an ovarian cyst that is likely ruptured with resultant free fluid in the pelvis.  Recommend routine follow-up with your OB/GYN.  Ibuprofen for pain control.  Please return to the Emergency Department if you experience any worsening of your condition.  Thank you for allowing [place] to be a part of your care.

## 2021-03-19 NOTE — ED Notes (Signed)
Patient verbalizes understanding of d/c instructions. Opportunities for questions and answers were provided. Pt d/c from ED and ambulated to lobby where friend is picking pt up.  

## 2021-03-19 NOTE — ED Triage Notes (Signed)
Patient here with complaint of dizziness, back pain, and abdominal pain after colonoscopy and Monday. Patient alert, oriented, ambulatory, and in no apparent distress at hits time.

## 2021-03-19 NOTE — ED Notes (Signed)
Pt back from radiology 

## 2021-03-20 LAB — GC/CHLAMYDIA PROBE AMP (~~LOC~~) NOT AT ARMC
Chlamydia: NEGATIVE
Comment: NEGATIVE
Comment: NORMAL
Neisseria Gonorrhea: NEGATIVE

## 2021-04-03 ENCOUNTER — Inpatient Hospital Stay: Payer: Medicaid Other | Attending: Internal Medicine | Admitting: Genetic Counselor

## 2021-04-03 ENCOUNTER — Inpatient Hospital Stay: Payer: Medicaid Other

## 2021-04-03 ENCOUNTER — Other Ambulatory Visit: Payer: Self-pay

## 2021-04-03 DIAGNOSIS — Z800 Family history of malignant neoplasm of digestive organs: Secondary | ICD-10-CM | POA: Diagnosis not present

## 2021-04-03 DIAGNOSIS — Z803 Family history of malignant neoplasm of breast: Secondary | ICD-10-CM

## 2021-04-04 ENCOUNTER — Encounter: Payer: Self-pay | Admitting: Genetic Counselor

## 2021-04-04 DIAGNOSIS — Z800 Family history of malignant neoplasm of digestive organs: Secondary | ICD-10-CM | POA: Insufficient documentation

## 2021-04-04 DIAGNOSIS — Z803 Family history of malignant neoplasm of breast: Secondary | ICD-10-CM | POA: Insufficient documentation

## 2021-04-04 HISTORY — DX: Family history of malignant neoplasm of digestive organs: Z80.0

## 2021-04-04 LAB — GENETIC SCREENING ORDER

## 2021-04-04 NOTE — Progress Notes (Signed)
REFERRING PROVIDER: Fredrich Romans, [address],  [place] [address]  PRIMARY PROVIDER:  Bobbye Charleston, MD  PRIMARY REASON FOR VISIT:  1. Family history of breast cancer   2. Family history of Black cancer     HISTORY OF PRESENT ILLNESS:   [name], a 31 y.o. female, was seen for a [facility] cancer genetics consultation at the request of Fredrich Romans, PA-C due to a family history of breast and Black cancers.  [name] presents to clinic today to discuss the possibility of a hereditary predisposition to cancer, to discuss genetic testing, and to further clarify her future cancer risks, as well as potential cancer risks for family members.   [name] is a 31 y.o. female with no personal history of cancer.    CANCER HISTORY:  Oncology History   No history exists.     RISK FACTORS:  Menarche was at age unknown.  First live birth at age 29 or 78.  OCP use for approximately 0 years; recently began OCP within past week Ovaries intact: yes.  Hysterectomy: no.  Menopausal status: premenopausal.  Colonoscopy: yes; most recent this year. Mammogram within the last year: no. Number of breast biopsies: 0. Up to date with pelvic exams: yes.   Past Medical History:  Diagnosis Date   Anemia    Family history of adverse reaction to anesthesia    mother woke up during abdominal surgery 3 years ago   Family history of Black cancer 04/04/2021   Nausea & vomiting    last month   Pregnancy    19 weeks and few days michelle horvath ob dr   Symptomatic cholelithiasis     Past Surgical History:  Procedure Laterality Date   CHOLECYSTECTOMY N/A 11/16/2016   Procedure: LAPAROSCOPIC CHOLECYSTECTOMY;  Surgeon: Ileana Roup, MD;  Location: [facility];  Service: General;  Laterality: N/A;   TOOTH EXTRACTION       FAMILY HISTORY:  We obtained a detailed, 4-generation family history.  Significant diagnoses are listed below: Family History   Problem Relation Age of Onset   Breast cancer Maternal Aunt 30   Black cancer Maternal Grandfather 48    [name] is unaware of previous family history of genetic testing for hereditary cancer risks. Affected family members were unavailable for testing at this point in time. Patient's maternal ancestors are of [place] descent, and paternal ancestors are of [place] descent. There is no reported Ashkenazi Jewish ancestry. There is no known consanguinity.  GENETIC COUNSELING ASSESSMENT: [name] is a 31 y.o. female with a family history of cancer which is not highly suggestive of a hereditary cancer syndrome. We, therefore, discussed and recommended the following at today's visit.   DISCUSSION: We discussed that, in general, most cancer is not inherited in families, but instead is sporadic or familial. Sporadic cancers occur by chance and typically happen at older ages (>50 years) as this type of cancer is caused by genetic changes acquired during an individuals lifetime. Some families have more cancers than would be expected by chance; however, the ages or types of cancer are not consistent with a known genetic mutation or known genetic mutations have been ruled out. This type of familial cancer is thought to be due to a combination of multiple genetic, environmental, hormonal, and lifestyle factors. While this combination of factors likely increases the risk of cancer, the exact source of this risk is not currently identifiable or testable.  We discussed that approximately 5-10% of cancer is hereditary, meaning that it is due to a mutation in a single gene that is passed down from generation to generation in a family. Most hereditary cases of breast cancer are associated with mutations in the BRCA1/2 genes. There are other genes that can be associated an increased risk for breast or Black cancer. We discussed that testing can be beneficial for several reasons, including  knowing about other cancer risks, identifying potential screening and risk-reduction options that may be appropriate, and to understand if other family members could be at risk for cancer and allow them to undergo genetic testing.  We discussed with [name] that the family history does not meet insurance or NCCN criteria for genetic testing and, therefore, is not highly consistent with a familial hereditary cancer syndrome.  However, [name] wished to proceed with testing, as she believes she could benefit from this information.  We discussed that this is reasonable because she has limited health history information about some relatives.   [name] was offered a common hereditary cancer panel (47 genes) and an expanded pan-cancer panel (77 genes). [name] was informed of the benefits and limitations of each panel, including that expanded pan-cancer panels contain several genes that do not have clear management guidelines at this point in time.  We also discussed that as the number of genes included on a panel increases, the chances of variants of uncertain significance increases.  After considering the benefits and limitations of each gene panel, [name] elected to have an CancerNext-Expanded +RNAinsight Panel through [facility]   The CancerNext-Expanded gene panel offered by [facility] and includes sequencing, rearrangement, and RNA analysis for the following 77 genes: AIP, ALK, APC, ATM, AXIN2, BAP1, BARD1, BLM, BMPR1A, BRCA1, BRCA2, BRIP1, CDC73, CDH1, CDK4, CDKN1B, CDKN2A, CHEK2, CTNNA1, DICER1, FANCC, FH, FLCN, GALNT12, KIF1B, LZTR1, MAX, MEN1, MET, MLH1, MSH2, MSH3, MSH6, MUTYH, NBN, NF1, NF2, NTHL1, PALB2, PHOX2B, PMS2, POT1, PRKAR1A, PTCH1, PTEN, RAD51C, RAD51D, RB1, RECQL, RET, SDHA, SDHAF2, SDHB, SDHC, SDHD, SMAD4, SMARCA4, SMARCB1, SMARCE1, STK11, SUFU, TMEM127, TP53, TSC1, TSC2, VHL and XRCC2 (sequencing and deletion/duplication); EGFR,  EGLN1, HOXB13, KIT, MITF, PDGFRA, POLD1, and POLE (sequencing only); EPCAM and GREM1 (deletion/duplication only).   We discussed that if her out of pocket cost for testing is over $100, the laboratory will contact her to discuss self-pay options and/or patient pay assistance programs.   We discussed that some people do not want to undergo genetic testing due to fear of genetic discrimination.  A federal law called the Genetic Information Non-Discrimination Act (GINA) of 2008 helps protect individuals against genetic discrimination based on their genetic test results.  It impacts both health insurance and employment.  With health insurance, it protects against increased premiums, being kicked off insurance or being forced to take a test in order to be insured.  For employment it protects against hiring, firing and promoting decisions based on genetic test results.  GINA does not apply to those in the [employer], those who work for companies with less than 15 employees, and new life insurance or long-term disability insurance policies.  Health status due to a cancer diagnosis is not protected under GINA.  PLAN: After considering the risks, benefits, and limitations, [name] provided informed consent to pursue genetic testing and the blood sample was sent to [facility] for analysis of the CancerNext-Expanded +RNAinsight Panel. Results should be available within approximately 3 weeks' time, at  which point they will be disclosed by telephone to Ms. Mercado Black, as will any additional recommendations warranted by these results. [name] will receive a summary of her genetic counseling visit and a copy of her results once available. This information will also be available in Epic.   Lastly, we encouraged Ms. Mercado Black to remain in contact with cancer genetics annually so that we can continuously update the family history and inform her of any changes in cancer genetics and testing  that may be of benefit for this family.   [name]'s questions were answered to her satisfaction today. Our contact information was provided should additional questions or concerns arise. Thank you for the referral and allowing [place] to share in the care of your patient.   Harrol Novello M. Joette Catching, [place], [facility] Genetic Counselor [email or web address] (P) [phone]   The patient was seen for a total of 30 minutes in face-to-face genetic counseling.  The patient was seen alone.  Drs. Magrinat, Lindi Adie and/or Burr Medico were available to discuss this case as needed.  _______________________________________________________________________ For Office Staff:  Number of people involved in session: 1 Was an Intern/ student involved with case: no

## 2021-04-17 ENCOUNTER — Ambulatory Visit: Payer: Self-pay | Admitting: General Surgery

## 2021-04-22 ENCOUNTER — Encounter: Payer: Self-pay | Admitting: Genetic Counselor

## 2021-04-22 ENCOUNTER — Telehealth: Payer: Self-pay | Admitting: Genetic Counselor

## 2021-04-22 ENCOUNTER — Ambulatory Visit: Payer: Self-pay | Admitting: Genetic Counselor

## 2021-04-22 DIAGNOSIS — Z1379 Encounter for other screening for genetic and chromosomal anomalies: Secondary | ICD-10-CM | POA: Insufficient documentation

## 2021-04-22 DIAGNOSIS — Z803 Family history of malignant neoplasm of breast: Secondary | ICD-10-CM

## 2021-04-22 DIAGNOSIS — Z800 Family history of malignant neoplasm of digestive organs: Secondary | ICD-10-CM

## 2021-04-22 NOTE — Telephone Encounter (Signed)
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  Discussed that we do not know why there is cancer in the family. It could be sporadic/famillial, due to a change in a gene that she did not inherit, due to a different gene that we are not testing, or maybe our current technology may not be able to pick something up.  It will be important for her to keep in contact with genetics to keep up with whether additional testing may be needed.   ? ? ?

## 2021-04-22 NOTE — Progress Notes (Signed)
HPI:   ?Kelsey Black was previously seen in the [facility] clinic due to a family history of cancer and concerns regarding a hereditary predisposition to cancer. Please refer to our prior cancer genetics clinic note for more information regarding our discussion, assessment and recommendations, at the time. Kelsey Black recent genetic test results were disclosed to her, as were recommendations warranted by these results. These results and recommendations are discussed in more detail below. ? ?CANCER HISTORY:  ?Oncology History  ? No history exists.  ? ? ?FAMILY HISTORY:  ?We obtained a detailed, 4-generation family history.  Significant diagnoses are listed below: ?[name] is unaware of previous family history of genetic testing for hereditary cancer risks. Affected family members were unavailable for testing at this point in time. Patient's maternal ancestors are of [place] descent, and paternal ancestors are of [place] descent. There is no reported Ashkenazi Jewish ancestry. There is no known consanguinity. ? ?GENETIC TEST RESULTS:  ?The [facility] CancerNext-Expanded +RNAinsight Panel found no pathogenic mutations. The CancerNext-Expanded gene panel offered by [facility] and includes sequencing, rearrangement, and RNA analysis for the following 77 genes: AIP, ALK, APC, ATM, AXIN2, BAP1, BARD1, BLM, BMPR1A, BRCA1, BRCA2, BRIP1, CDC73, CDH1, CDK4, CDKN1B, CDKN2A, CHEK2, CTNNA1, DICER1, FANCC, FH, FLCN, GALNT12, KIF1B, LZTR1, MAX, MEN1, MET, MLH1, MSH2, MSH3, MSH6, MUTYH, NBN, NF1, NF2, NTHL1, PALB2, PHOX2B, PMS2, POT1, PRKAR1A, PTCH1, PTEN, RAD51C, RAD51D, RB1, RECQL, RET, SDHA, SDHAF2, SDHB, SDHC, SDHD, SMAD4, SMARCA4, SMARCB1, SMARCE1, STK11, SUFU, TMEM127, TP53, TSC1, TSC2, VHL and XRCC2 (sequencing and deletion/duplication); EGFR, EGLN1, HOXB13, KIT, MITF, PDGFRA, POLD1, and POLE (sequencing only); EPCAM and GREM1 (deletion/duplication only).  ? ?The test report  has been scanned into EPIC and is located under the Molecular Pathology section of the Results Review tab.  A portion of the result report is included below for reference. Genetic testing reported out on April 18, 2021.  ? ? ? 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  At this time, it is unknown if this variant is associated with an increased risk for cancer or if it is benign, but most uncertain variants are reclassified to benign. It should not be used to make medical management decisions. With time, we suspect the laboratory will determine the significance of this variant, if any. If the laboratory reclassifies this variant, we will attempt to contact Kelsey Black to discuss it further.  ? ?Even though a pathogenic variant was not identified, possible explanations for the cancer in the family may include: ?There may be no hereditary risk for cancer in the family. The cancers in [name]'s family may be sporadic/familial or due to other genetic and environmental factors. ?There may be a gene mutation in one of these genes that current testing methods cannot detect but that chance is small. ?There could be another gene that has not yet been discovered, or that we have not yet tested, that is responsible for the cancer diagnoses in the family.  ?It is also possible there is a hereditary cause for the cancer in the family that Kelsey Black did not inherit. ? ?Therefore, it is important to remain in touch with cancer genetics in the future so that we can continue to offer Kelsey Black the most up to date genetic testing.  ? ?ADDITIONAL GENETIC TESTING:  ?We discussed with [name] that her genetic testing was fairly extensive.  If  there are genes identified to increase cancer risk that can be analyzed in the future, we would be happy to discuss and coordinate this testing at that time.   ? ?CANCER SCREENING  RECOMMENDATIONS:  ?Kelsey Black's test result is considered negative (normal).  This means that we have not identified a hereditary cause for her family history of cancer at this time.  ? ?An individual's cancer risk and medical management are not determined by genetic test results alone. Overall cancer risk assessment incorporates additional factors, including personal medical history, family history, and any available genetic information that may result in a personalized plan for cancer prevention and surveillance. Therefore, it is recommended she continue to follow the cancer management and screening guidelines provided by her primary healthcare provider. ? ?RECOMMENDATIONS FOR FAMILY MEMBERS:   ?Since she did not inherit a identifiable mutation in a cancer predisposition gene included on this panel, her children could not have inherited a known mutation from her in one of these genes. ?Individuals in this family might be at some increased risk of developing cancer, over the general population risk, due to the family history of cancer.  Individuals in the family should notify their providers of the family history of cancer. We recommend women in this family have a yearly mammogram beginning at age 17, or 27 years younger than the earliest onset of cancer, an annual clinical breast exam, and perform monthly breast self-exams.   First degree relatives of those with Black cancer should receive colonoscopies beginning at age 23, or 10 years prior to the earliest diagnosis of Black cancer in the family, and receive colonoscopies at least every 5 years, or as recommended by their gastroenterologist.   ?We do not recommend familial testing for the SDHAF2 variant of uncertain significance (VUS). ? ?FOLLOW-UP:  ?Lastly, we discussed with [name] that cancer genetics is a rapidly advancing field and it is possible that new genetic tests will be appropriate for her and/or her family members in the future. We  encouraged her to remain in contact with cancer genetics on an annual basis so we can update her personal and family histories and let her know of advances in cancer genetics that may benefit this family.  ? ?Our contact number was provided. [name]'s questions were answered to her satisfaction, and she knows she is welcome to call us at anytime with additional questions or concerns.  ? ?Diantha Paxson M. Joette Catching, [place], [place] ?Genetic Counselor ?[email or web address] ?(P) [phone] ? ?

## 2021-04-22 NOTE — Telephone Encounter (Signed)
Contacted patient in attempt to disclose results of genetic testing.  LVM with contact information requesting a call back.  

## 2021-05-01 NOTE — Pre-Procedure Instructions (Signed)
Surgical Instructions ? ? ? Your procedure is scheduled on Tuesday, March 28th. ? Report to [facility] Main Entrance "A" at 5:30 A.M., then check in with the Admitting office. ? Call this number if you have problems the morning of surgery: ? [phone] ? ? If you have any questions prior to your surgery date call [phone]: Open Monday-Friday 8am-4pm ? ? ? Remember: ? Do not eat after midnight the night before your surgery ? ?You may drink clear liquids until 4:30 a.m. the morning of your surgery.   ?Clear liquids allowed are: Water, Non-Citrus Juices (without pulp), Carbonated Beverages, Clear Tea, Black Coffee Only (NO MILK, CREAM OR POWDERED CREAMER of any kind), and Gatorade. ?  ? Take these medicines the morning of surgery with A SIP OF WATER ?  ?As needed: ?fluticasone [facility])  ?levocetirizine (XYZAL)  ?promethazine (PHENERGAN)  ?sertraline (ZOLOFT)  ? ?STOP Diethylpropion HCl CR as of NOW. ? ?As of today, STOP taking any Aspirin (unless otherwise instructed by your surgeon) Aleve, Naproxen, Ibuprofen, Motrin, Advil, Goody's, BC's, all herbal medications, fish oil, and all vitamins. ? ?           ?Do NOT Smoke (Tobacco/Vaping) for 24 hours prior to your procedure. ? ?If you use a CPAP at night, you may bring your mask/headgear for your overnight stay. ?  ?Contacts, glasses, piercing's, hearing aid's, dentures or partials may not be worn into surgery, please bring cases for these belongings.  ?  ?For patients admitted to the hospital, discharge time will be determined by your treatment team. ?  ?Patients discharged the day of surgery will not be allowed to drive home, and someone needs to stay with them for 24 hours. ? ?NO VISITORS WILL BE ALLOWED IN PRE-OP WHERE PATIENTS ARE PREPPED FOR SURGERY.  ONLY 1 SUPPORT PERSON MAY BE PRESENT IN THE WAITING ROOM WHILE YOU ARE IN SURGERY.  IF YOU ARE TO BE ADMITTED, ONCE YOU ARE IN YOUR ROOM YOU WILL BE ALLOWED TWO (2) VISITORS. (1) VISITOR MAY STAY OVERNIGHT BUT  MUST ARRIVE TO THE ROOM BY 8pm.  Minor children may have two parents present. Special consideration for safety and communication needs will be reviewed on a case by case basis. ? ? ?Special instructions:   ?[facility]- Preparing For Surgery ? ?Before surgery, you can play an important role. Because skin is not sterile, your skin needs to be as free of germs as possible. You can reduce the number of germs on your skin by washing with CHG (chlorahexidine gluconate) Soap before surgery.  CHG is an antiseptic cleaner which kills germs and bonds with the skin to continue killing germs even after washing.   ? ?Oral Hygiene is also important to reduce your risk of infection.  Remember - BRUSH YOUR TEETH THE MORNING OF SURGERY WITH YOUR REGULAR TOOTHPASTE ? ?Please do not use if you have an allergy to CHG or antibacterial soaps. If your skin becomes reddened/irritated stop using the CHG.  ?Do not shave (including legs and underarms) for at least 48 hours prior to first CHG shower. It is OK to shave your face. ? ?Please follow these instructions carefully. ?  ?Shower the NIGHT BEFORE SURGERY and the MORNING OF SURGERY ? ?If you chose to wash your hair, wash your hair first as usual with your normal shampoo. ? ?After you shampoo, rinse your hair and body thoroughly to remove the shampoo. ? ?Use CHG Soap as you would any other liquid soap. You can apply CHG  directly to the skin and wash gently with a scrungie or a clean washcloth.  ? ?Apply the CHG Soap to your body ONLY FROM THE NECK DOWN.  Do not use on open wounds or open sores. Avoid contact with your eyes, ears, mouth and genitals (private parts). Wash Face and genitals (private parts)  with your normal soap.  ? ?Wash thoroughly, paying special attention to the area where your surgery will be performed. ? ?Thoroughly rinse your body with warm water from the neck down. ? ?DO NOT shower/wash with your normal soap after using and rinsing off the CHG Soap. ? ?Pat yourself  dry with a CLEAN TOWEL. ? ?Wear CLEAN PAJAMAS to bed the night before surgery ? ?Place CLEAN SHEETS on your bed the night before your surgery ? ?DO NOT SLEEP WITH PETS. ? ? ?Day of Surgery: ?Take a shower with CHG soap. ?Do not wear jewelry or makeup ?Do not wear lotions, powders, perfumes, or deodorant. ?Do not shave 48 hours prior to surgery.   ?Do not bring valuables to the hospital.  ?[facility] is not responsible for any belongings or valuables. ?Do not wear nail polish, gel polish, artificial nails, or any other type of covering on natural nails (fingers and toes) ?If you have artificial nails or gel coating that need to be removed by a nail salon, please have this removed prior to surgery. Artificial nails or gel coating may interfere with anesthesia's ability to adequately monitor your vital signs. ?Wear Clean/Comfortable clothing the morning of surgery ?Do not apply any deodorants/lotions.   ?Remember to brush your teeth WITH YOUR REGULAR TOOTHPASTE. ?  ?Please read over the following fact sheets that you were given. ? ? ?? Notify your provider: ? ?o if you are in close contact with someone who has COVID ? ?o or if you develop a fever of 100.4 or greater, sneezing, cough, sore throat, shortness of breath or body aches.  ? ?

## 2021-05-02 ENCOUNTER — Other Ambulatory Visit: Payer: Self-pay

## 2021-05-02 ENCOUNTER — Encounter (HOSPITAL_COMMUNITY)
Admission: RE | Admit: 2021-05-02 | Discharge: 2021-05-02 | Disposition: A | Discharge status: Home / Self Care | Payer: Medicaid Other | Source: Ambulatory Visit | Attending: General Surgery | Admitting: General Surgery

## 2021-05-02 ENCOUNTER — Encounter (HOSPITAL_COMMUNITY): Payer: Self-pay

## 2021-05-02 VITALS — BP 122/70 | HR 92 | Temp 99.2°F | Resp 18 | Ht 65.0 in | Wt 189.3 lb

## 2021-05-02 DIAGNOSIS — Z20822 Contact with and (suspected) exposure to covid-19: Secondary | ICD-10-CM | POA: Insufficient documentation

## 2021-05-02 DIAGNOSIS — Z01812 Encounter for preprocedural laboratory examination: Secondary | ICD-10-CM | POA: Insufficient documentation

## 2021-05-02 DIAGNOSIS — Z01818 Encounter for other preprocedural examination: Secondary | ICD-10-CM

## 2021-05-02 HISTORY — DX: Depression, unspecified: F32.A

## 2021-05-02 LAB — CBC
HCT: 37.5 % (ref 36.0–46.0)
Hemoglobin: 11.5 g/dL — ABNORMAL LOW (ref 12.0–15.0)
MCH: 26.6 pg (ref 26.0–34.0)
MCHC: 30.7 g/dL (ref 30.0–36.0)
MCV: 86.8 fL (ref 80.0–100.0)
Platelets: 295 10*3/uL (ref 150–400)
RBC: 4.32 MIL/uL (ref 3.87–5.11)
RDW: 15.2 % (ref 11.5–15.5)
WBC: 5.8 10*3/uL (ref 4.0–10.5)
nRBC: 0 % (ref 0.0–0.2)

## 2021-05-02 NOTE — Progress Notes (Signed)
PCP - Dr. Bobbye Charleston ?Cardiologist - denies ? ?PPM/ICD - n/a ? ?Chest x-ray - n/a ?EKG - n/a ?Stress Test - reports a treadmill ST 5+ years ago. Pt can not remember where she had it done or why she had it done but it was normal and no f/u was required.   ?ECHO - denies ?Cardiac Cath - denies ? ?Sleep Study - denies ?CPAP - denies ? ?Blood Thinner Instructions: n/a ?Aspirin Instructions: n/a ? ?ERAS Protcol -Clear liquids until 0430 DOS ?PRE-SURGERY Ensure or G2- none ordered ? ?COVID TEST- Pt presented with a cough x3 days. Pt states that her daughter was recently sick and she believes that she may have gotten sick from her daughter. Daughter was not tested for covid. Pt was tested today d/t being symptomatic. Pt also stated she recently traveled internationally in the last month to [place]. The cough was very strong and persistent throughout the appt. Pt denies any other symptoms.  ? ?Anesthesia review: No ? ?Patient denies shortness of breath, fever, cough and chest pain at PAT appointment ? ? ?All instructions explained to the patient, with a verbal understanding of the material. Patient agrees to go over the instructions while at home for a better understanding. Patient also instructed to self quarantine after being tested for COVID-19. The opportunity to ask questions was provided. ? ? ?

## 2021-05-03 LAB — SARS CORONAVIRUS 2 (TAT 6-24 HRS): SARS Coronavirus 2: NEGATIVE

## 2021-05-09 ENCOUNTER — Telehealth: Payer: Medicaid Other | Admitting: Nurse Practitioner

## 2021-05-09 DIAGNOSIS — J069 Acute upper respiratory infection, unspecified: Secondary | ICD-10-CM | POA: Diagnosis not present

## 2021-05-09 MED ORDER — ALBUTEROL SULFATE HFA 108 (90 BASE) MCG/ACT IN AERS: 2.0000 | INHALATION_SPRAY | Freq: Four times a day (QID) | RESPIRATORY_TRACT | 0 refills | Status: DC | PRN

## 2021-05-09 MED ORDER — PREDNISONE 20 MG PO TABS: 40.0000 mg | ORAL_TABLET | Freq: Every day | ORAL | 0 refills | Status: AC

## 2021-05-09 MED ORDER — PROMETHAZINE-DM 6.25-15 MG/5ML PO SYRP: 5.0000 mL | ORAL_SOLUTION | Freq: Four times a day (QID) | ORAL | 0 refills | Status: DC | PRN

## 2021-05-09 NOTE — Patient Instructions (Signed)

## 2021-05-09 NOTE — Progress Notes (Signed)
? ?Virtual Visit Consent  ? ?Kelsey Bryant Black, you are scheduled for a virtual visit with Mary-Margaret Daphine Deutscher, FNP, a [facility] provider, today.   ?  ?Just as with appointments in the office, your consent must be obtained to participate.  Your consent will be active for this visit and any virtual visit you may have with one of our providers in the next 365 days.   ?  ?If you have a MyChart account, a copy of this consent can be sent to you electronically.  All virtual visits are billed to your insurance company just like a traditional visit in the office.   ? ?As this is a virtual visit, video technology does not allow for your provider to perform a traditional examination.  This may limit your provider's ability to fully assess your condition.  If your provider identifies any concerns that need to be evaluated in person or the need to arrange testing (such as labs, EKG, etc.), we will make arrangements to do so.   ?  ?Although advances in technology are sophisticated, we cannot ensure that it will always work on either your end or our end.  If the connection with a video visit is poor, the visit may have to be switched to a telephone visit.  With either a video or telephone visit, we are not always able to ensure that we have a secure connection.    ? ?I need to obtain your verbal consent now.   Are you willing to proceed with your visit today? YES ?  ?Kelsey Black has provided verbal consent on 05/09/2021 for a virtual visit (video or telephone). ?  ?Mary-Margaret Daphine Deutscher, FNP  ? ?Date: 05/09/2021 12:08 PM ? ? ?Virtual Visit via Video Note  ? ?I, Mary-Margaret Daphine Deutscher, connected with Kelsey Black ([id number], Jul 03, 1990) on 05/09/21 at 12:15 PM EDT by a video-enabled telemedicine application and verified that I am speaking with the correct person using two identifiers. ? ?Location: ?Patient: Virtual Visit Location Patient: Home ?Provider: Virtual Visit Location Provider: Mobile ?  ?I  discussed the limitations of evaluation and management by telemedicine and the availability of in person appointments. The patient expressed understanding and agreed to proceed.   ? ?History of Present Illness: ?Kelsey Black is a 31 y.o. who identifies as a female who was assigned female at birth, and is being seen today for cough. ? ?HPI: Patient calls stating that she has been coughing for about 2 weeks. It has become productive. She deneis fever or congestion. The cough is worse at night when she lays down. OTC meds asre not helping. She becomes SOB when she has a coughing spell at night. ? ?Cough ?Associated symptoms include shortness of breath. Pertinent negatives include no chills, fever, headaches, myalgias, sore throat or wheezing.   ?Review of Systems  ?Constitutional:  Negative for chills, fever and malaise/fatigue.  ?HENT:  Negative for congestion and sore throat.   ?Respiratory:  Positive for cough, sputum production and shortness of breath. Negative for wheezing.   ?Musculoskeletal:  Negative for myalgias.  ?Neurological:  Negative for headaches.  ? ?Problems:  ?Patient Active Problem List  ? Diagnosis Date Noted  ? Genetic testing 04/22/2021  ? Family history of breast cancer 04/04/2021  ? Family history of Black cancer 04/04/2021  ? Indication for care in labor or delivery 03/22/2017  ? Chronic cholecystitis 11/16/2016  ? Bacterial vaginosis 08/01/2014  ?  ?Allergies:  ?Allergies  ?Allergen Reactions  ? Penicillins Swelling and  Other (See Comments)  ?  Reaction:  Arm/leg swelling  ?Has patient had a PCN reaction causing immediate rash, facial/tongue/throat swelling, SOB or lightheadedness with hypotension: Yes ?Has patient had a PCN reaction causing severe rash involving mucus membranes or skin necrosis: No ?Has patient had a PCN reaction that required hospitalization: No ?Has patient had a PCN reaction occurring within the last 10 years: No ?If all of the above answers are "NO", then may  proceed with Cephalosporin use.  ? Shrimp [Shellfish Allergy] Swelling and Other (See Comments)  ?  Localized swelling  ? ?Medications:  ?Current Outpatient Medications:  ?  benzonatate (TESSALON) 100 MG capsule, Take 1 capsule (100 mg total) by mouth 3 (three) times daily as needed for cough. (Patient not taking: Reported on 04/29/2021), Disp: 21 capsule, Rfl: 0 ?  cyclobenzaprine (FLEXERIL) 10 MG tablet, Take 1 tablet (10 mg total) by mouth 3 (three) times daily as needed for muscle spasms. (Patient not taking: Reported on 04/29/2021), Disp: 10 tablet, Rfl: 0 ?  Diethylpropion HCl CR 75 MG TB24, Take 75 mg by mouth daily., Disp: , Rfl:  ?  EPIPEN 2-PAK 0.3 MG/0.3ML SOAJ injection, Inject 0.3 mg into the muscle as needed for anaphylaxis., Disp: , Rfl:  ?  fluticasone (FLONASE) 50 MCG/ACT nasal spray, Place 2 sprays into both nostrils daily for 7 days. (Patient taking differently: Place 2 sprays into both nostrils daily as needed for allergies.), Disp: 1 g, Rfl: 0 ?  HYDROcodone-acetaminophen (NORCO/VICODIN) 5-325 MG tablet, Take 1-2 tablets by mouth every 6 (six) hours as needed. (Patient not taking: Reported on 06/07/2019), Disp: 10 tablet, Rfl: 0 ?  hydrOXYzine (VISTARIL) 25 MG capsule, Take 25 mg by mouth at bedtime as needed for sleep., Disp: , Rfl:  ?  ibuprofen (ADVIL) 800 MG tablet, Take 1 tablet (800 mg total) by mouth 3 (three) times daily. (Patient taking differently: Take 800 mg by mouth every 8 (eight) hours as needed for moderate pain.), Disp: 21 tablet, Rfl: 0 ?  LARIN 24 FE 1-20 MG-MCG(24) tablet, Take 1 tablet by mouth daily., Disp: , Rfl:  ?  levocetirizine (XYZAL) 5 MG tablet, Take 5 mg by mouth daily as needed for allergies., Disp: , Rfl:  ?  ondansetron (ZOFRAN) 4 MG tablet, Take 1 tablet (4 mg total) by mouth every 8 (eight) hours as needed for nausea or vomiting. (Patient not taking: Reported on 04/29/2021), Disp: 20 tablet, Rfl: 0 ?  promethazine (PHENERGAN) 25 MG tablet, Take 25 mg by mouth  every 6 (six) hours as needed for nausea/vomiting., Disp: , Rfl:  ?  sertraline (ZOLOFT) 50 MG tablet, Take 50 mg by mouth daily as needed for anxiety., Disp: , Rfl:  ? ?Observations/Objective: ?Patient is well-developed, well-nourished in no acute distress.  ?Resting comfortably  at home.  ?Head is normocephalic, atraumatic.  ?No labored breathing.  ?Speech is clear and coherent with logical content.  ?Patient is alert and oriented at baseline.  ?No cough noted during visit ? ?Assessment and Plan: ? ?Kelsey Black in today with chief complaint of Cough ? ? ?1. Viral upper respiratory tract infection with cough ? ?1. Take meds as prescribed ?2. Use a cool mist humidifier especially during the winter months and when heat has been humid. ?3. Use saline nose sprays frequently ?4. Saline irrigations of the nose can be very helpful if done frequently. ? * 4X daily for 1 week* ? * Use of a nettie pot can be helpful with this. Follow directions  with this* ?5. Drink plenty of fluids ?6. Keep thermostat turn down low ?7.For any cough or congestion- promethazine DM ?8. For fever or aces or pains- take tylenol or ibuprofen appropriate for age and weight. ? * for fevers greater than 101 orally you may alternate ibuprofen and tylenol every  3 hours. ?  ? ?Meds ordered this encounter  ?Medications  ? promethazine-dextromethorphan (PROMETHAZINE-DM) 6.25-15 MG/5ML syrup  ?  Sig: Take 5 mLs by mouth 4 (four) times daily as needed for cough.  ?  Dispense:  118 mL  ?  Refill:  0  ?  Order Specific Question:   Supervising Provider  ?  Answer:   Eber Hong [[id number]]  ? predniSONE (DELTASONE) 20 MG tablet  ?  Sig: Take 2 tablets (40 mg total) by mouth daily with breakfast for 5 days. 2 po daily for 5 days  ?  Dispense:  10 tablet  ?  Refill:  0  ?  Order Specific Question:   Supervising Provider  ?  Answer:   Eber Hong [[id number]]  ? albuterol (VENTOLIN HFA) 108 (90 Base) MCG/ACT inhaler  ?  Sig: Inhale 2 puffs into the lungs  every 6 (six) hours as needed for wheezing or shortness of breath.  ?  Dispense:  8 g  ?  Refill:  0  ?  Order Specific Question:   Supervising Provider  ?  Answer:   Eber Hong [[id number]]  ? ? ?Follow Up Inst

## 2021-05-13 ENCOUNTER — Encounter (HOSPITAL_COMMUNITY): Payer: Self-pay | Admitting: General Surgery

## 2021-05-13 ENCOUNTER — Encounter (HOSPITAL_COMMUNITY)
Admission: RE | Disposition: A | Discharge status: Home / Self Care | Payer: Self-pay | Source: Ambulatory Visit | Attending: General Surgery

## 2021-05-13 ENCOUNTER — Ambulatory Visit (HOSPITAL_COMMUNITY)
Admission: RE | Admit: 2021-05-13 | Discharge: 2021-05-13 | Disposition: A | Discharge status: Home / Self Care | Payer: Medicaid Other | Source: Ambulatory Visit | Attending: General Surgery | Admitting: General Surgery

## 2021-05-13 ENCOUNTER — Other Ambulatory Visit: Payer: Self-pay

## 2021-05-13 ENCOUNTER — Ambulatory Visit (HOSPITAL_BASED_OUTPATIENT_CLINIC_OR_DEPARTMENT_OTHER): Payer: Medicaid Other | Admitting: Certified Registered Nurse Anesthetist

## 2021-05-13 ENCOUNTER — Ambulatory Visit (HOSPITAL_COMMUNITY): Payer: Medicaid Other | Admitting: Certified Registered Nurse Anesthetist

## 2021-05-13 DIAGNOSIS — K644 Residual hemorrhoidal skin tags: Secondary | ICD-10-CM

## 2021-05-13 DIAGNOSIS — K648 Other hemorrhoids: Secondary | ICD-10-CM

## 2021-05-13 HISTORY — PX: HEMORRHOID SURGERY: SHX153

## 2021-05-13 LAB — POCT PREGNANCY, URINE: Preg Test, Ur: NEGATIVE

## 2021-05-13 SURGERY — HEMORRHOIDECTOMY
Anesthesia: General | Site: Rectum

## 2021-05-13 MED ORDER — METRONIDAZOLE 500 MG/100ML IV SOLN
500.0000 mg | INTRAVENOUS | Status: AC
  Administered 2021-05-13: 500 mg via INTRAVENOUS
  Filled 2021-05-13: qty 100

## 2021-05-13 MED ORDER — ONDANSETRON HCL 4 MG/2ML IJ SOLN
INTRAMUSCULAR | Status: DC | PRN
  Administered 2021-05-13: 4 mg via INTRAVENOUS

## 2021-05-13 MED ORDER — BUPIVACAINE-EPINEPHRINE 0.5% -1:200000 IJ SOLN
INTRAMUSCULAR | Status: DC | PRN
  Administered 2021-05-13: 20 mL

## 2021-05-13 MED ORDER — OXYCODONE HCL 5 MG PO TABS: 5.0000 mg | ORAL_TABLET | Freq: Once | ORAL | Status: DC | PRN

## 2021-05-13 MED ORDER — DEXAMETHASONE SODIUM PHOSPHATE 10 MG/ML IJ SOLN
INTRAMUSCULAR | Status: DC | PRN
  Administered 2021-05-13: 10 mg via INTRAVENOUS

## 2021-05-13 MED ORDER — CHLORHEXIDINE GLUCONATE CLOTH 2 % EX PADS: 6.0000 | MEDICATED_PAD | Freq: Once | CUTANEOUS | Status: DC

## 2021-05-13 MED ORDER — ONDANSETRON HCL 4 MG/2ML IJ SOLN
INTRAMUSCULAR | Status: AC
  Filled 2021-05-13: qty 2

## 2021-05-13 MED ORDER — OXYCODONE HCL 5 MG PO TABS: 5.0000 mg | ORAL_TABLET | Freq: Four times a day (QID) | ORAL | 0 refills | Status: DC | PRN

## 2021-05-13 MED ORDER — MIDAZOLAM HCL 2 MG/2ML IJ SOLN
INTRAMUSCULAR | Status: DC | PRN
Start: 2021-05-13 — End: 2021-05-13
  Administered 2021-05-13: 2 mg via INTRAVENOUS

## 2021-05-13 MED ORDER — SUGAMMADEX SODIUM 200 MG/2ML IV SOLN
INTRAVENOUS | Status: DC | PRN
Start: 2021-05-13 — End: 2021-05-13
  Administered 2021-05-13: 200 mg via INTRAVENOUS

## 2021-05-13 MED ORDER — ONDANSETRON HCL 4 MG/2ML IJ SOLN
4.0000 mg | Freq: Four times a day (QID) | INTRAMUSCULAR | Status: AC | PRN
  Administered 2021-05-13: 4 mg via INTRAVENOUS

## 2021-05-13 MED ORDER — OXYCODONE HCL 5 MG/5ML PO SOLN: 5.0000 mg | Freq: Once | ORAL | Status: DC | PRN

## 2021-05-13 MED ORDER — BUPIVACAINE LIPOSOME 1.3 % IJ SUSP
INTRAMUSCULAR | Status: AC
  Filled 2021-05-13: qty 20

## 2021-05-13 MED ORDER — CIPROFLOXACIN IN D5W 400 MG/200ML IV SOLN
400.0000 mg | INTRAVENOUS | Status: AC
  Administered 2021-05-13: 400 mg via INTRAVENOUS
  Filled 2021-05-13: qty 200

## 2021-05-13 MED ORDER — 0.9 % SODIUM CHLORIDE (POUR BTL) OPTIME
TOPICAL | Status: DC | PRN
  Administered 2021-05-13: 1000 mL

## 2021-05-13 MED ORDER — MIDAZOLAM HCL 2 MG/2ML IJ SOLN
INTRAMUSCULAR | Status: AC
  Filled 2021-05-13: qty 2

## 2021-05-13 MED ORDER — ROCURONIUM BROMIDE 10 MG/ML (PF) SYRINGE
PREFILLED_SYRINGE | INTRAVENOUS | Status: DC | PRN
Start: 2021-05-13 — End: 2021-05-13
  Administered 2021-05-13: 50 mg via INTRAVENOUS

## 2021-05-13 MED ORDER — THROMBIN 20000 UNITS EX SOLR
CUTANEOUS | Status: AC
  Filled 2021-05-13: qty 20000

## 2021-05-13 MED ORDER — THROMBIN 20000 UNITS EX KIT
PACK | CUTANEOUS | Status: DC | PRN
  Administered 2021-05-13: 20 mL via TOPICAL

## 2021-05-13 MED ORDER — GABAPENTIN 300 MG PO CAPS
300.0000 mg | ORAL_CAPSULE | ORAL | Status: AC
  Administered 2021-05-13: 300 mg via ORAL
  Filled 2021-05-13: qty 1

## 2021-05-13 MED ORDER — ACETAMINOPHEN 500 MG PO TABS
1000.0000 mg | ORAL_TABLET | ORAL | Status: AC
  Administered 2021-05-13: 1000 mg via ORAL
  Filled 2021-05-13: qty 2

## 2021-05-13 MED ORDER — BUPIVACAINE-EPINEPHRINE 0.5% -1:200000 IJ SOLN
INTRAMUSCULAR | Status: AC
  Filled 2021-05-13: qty 1

## 2021-05-13 MED ORDER — CHLORHEXIDINE GLUCONATE 0.12 % MT SOLN
15.0000 mL | Freq: Once | OROMUCOSAL | Status: AC
  Administered 2021-05-13: 15 mL via OROMUCOSAL

## 2021-05-13 MED ORDER — ORAL CARE MOUTH RINSE: 15.0000 mL | Freq: Once | OROMUCOSAL | Status: AC

## 2021-05-13 MED ORDER — FENTANYL CITRATE (PF) 250 MCG/5ML IJ SOLN
INTRAMUSCULAR | Status: DC | PRN
  Administered 2021-05-13 (×3): 50 ug via INTRAVENOUS
  Administered 2021-05-13: 100 ug via INTRAVENOUS

## 2021-05-13 MED ORDER — LACTATED RINGERS IV SOLN: INTRAVENOUS | Status: DC

## 2021-05-13 MED ORDER — PROPOFOL 10 MG/ML IV BOLUS
INTRAVENOUS | Status: AC
  Filled 2021-05-13: qty 40

## 2021-05-13 MED ORDER — LIDOCAINE 2% (20 MG/ML) 5 ML SYRINGE
INTRAMUSCULAR | Status: DC | PRN
  Administered 2021-05-13: 60 mg via INTRAVENOUS

## 2021-05-13 MED ORDER — FENTANYL CITRATE (PF) 100 MCG/2ML IJ SOLN: 25.0000 ug | INTRAMUSCULAR | Status: DC | PRN

## 2021-05-13 MED ORDER — PROPOFOL 10 MG/ML IV BOLUS
INTRAVENOUS | Status: DC | PRN
  Administered 2021-05-13: 150 mg via INTRAVENOUS

## 2021-05-13 MED ORDER — FENTANYL CITRATE (PF) 250 MCG/5ML IJ SOLN
INTRAMUSCULAR | Status: AC
  Filled 2021-05-13: qty 5

## 2021-05-13 MED ORDER — BUPIVACAINE LIPOSOME 1.3 % IJ SUSP
INTRAMUSCULAR | Status: DC | PRN
  Administered 2021-05-13: 20 mL

## 2021-05-13 SURGICAL SUPPLY — 36 items
BAG COUNTER SPONGE SURGICOUNT (BAG) ×2 IMPLANT
BAG SPNG CNTER NS LX DISP (BAG) ×1
CANISTER SUCT 3000ML PPV (MISCELLANEOUS) ×2 IMPLANT
COVER MAYO STAND STRL (DRAPES) ×2 IMPLANT
COVER SURGICAL LIGHT HANDLE (MISCELLANEOUS) ×2 IMPLANT
DRAPE UTILITY XL STRL (DRAPES) ×2 IMPLANT
ELECT REM PT RETURN 9FT ADLT (ELECTROSURGICAL) ×2
ELECTRODE REM PT RTRN 9FT ADLT (ELECTROSURGICAL) ×1 IMPLANT
GAUZE 4X4 16PLY ~~LOC~~+RFID DBL (SPONGE) ×2 IMPLANT
GAUZE SPONGE 4X4 12PLY STRL (GAUZE/BANDAGES/DRESSINGS) ×2 IMPLANT
GLOVE SRG 8 PF TXTR STRL LF DI (GLOVE) ×1 IMPLANT
GLOVE SURG ENC MOIS LTX SZ8 (GLOVE) ×2 IMPLANT
GLOVE SURG UNDER POLY LF SZ8 (GLOVE) ×2
GOWN STRL REUS W/ TWL LRG LVL3 (GOWN DISPOSABLE) ×1 IMPLANT
GOWN STRL REUS W/ TWL XL LVL3 (GOWN DISPOSABLE) ×1 IMPLANT
GOWN STRL REUS W/TWL LRG LVL3 (GOWN DISPOSABLE) ×2
GOWN STRL REUS W/TWL XL LVL3 (GOWN DISPOSABLE) ×2
KIT BASIN OR (CUSTOM PROCEDURE TRAY) ×2 IMPLANT
KIT SIGMOIDOSCOPE (SET/KITS/TRAYS/PACK) IMPLANT
KIT TURNOVER KIT B (KITS) ×2 IMPLANT
NEEDLE 22X1 1/2 (OR ONLY) (NEEDLE) ×2 IMPLANT
NS IRRIG 1000ML POUR BTL (IV SOLUTION) ×2 IMPLANT
PACK LITHOTOMY IV (CUSTOM PROCEDURE TRAY) ×2 IMPLANT
PAD ARMBOARD 7.5X6 YLW CONV (MISCELLANEOUS) ×4 IMPLANT
PENCIL SMOKE EVACUATOR (MISCELLANEOUS) ×1 IMPLANT
SHEARS HARMONIC 9CM CVD (BLADE) ×2 IMPLANT
SPECIMEN JAR SMALL (MISCELLANEOUS) ×2 IMPLANT
SPONGE SURGIFOAM ABS GEL 100 (HEMOSTASIS) IMPLANT
SURGILUBE 2OZ TUBE FLIPTOP (MISCELLANEOUS) ×1 IMPLANT
SUT CHROMIC 2 0 SH (SUTURE) ×1 IMPLANT
SUT CHROMIC 3 0 SH 27 (SUTURE) ×1 IMPLANT
SYR CONTROL 10ML LL (SYRINGE) ×2 IMPLANT
TOWEL GREEN STERILE FF (TOWEL DISPOSABLE) ×3 IMPLANT
TUBE CONNECTING 12X1/4 (SUCTIONS) ×2 IMPLANT
UNDERPAD 30X36 HEAVY ABSORB (UNDERPADS AND DIAPERS) ×2 IMPLANT
YANKAUER SUCT BULB TIP NO VENT (SUCTIONS) ×1 IMPLANT

## 2021-05-13 NOTE — Anesthesia Preprocedure Evaluation (Signed)
Anesthesia Evaluation  ?Patient identified by MRN, date of birth, ID band ?Patient awake ? ? ? ?Reviewed: ?Allergy & Precautions, H&P , NPO status , Patient's Chart, lab work & pertinent test results ? ?Airway ?Mallampati: II ? ? ?Neck ROM: full ? ? ? Dental ?  ?Pulmonary ?neg pulmonary ROS,  ?  ?breath sounds clear to auscultation ? ? ? ? ? ? Cardiovascular ?negative cardio ROS ? ? ?Rhythm:regular Rate:Normal ? ? ?  ?Neuro/Psych ?PSYCHIATRIC DISORDERS Depression   ? GI/Hepatic ?  ?Endo/Other  ? ? Renal/GU ?  ? ?  ?Musculoskeletal ? ? Abdominal ?  ?Peds ? Hematology ?  ?Anesthesia Other Findings ? ? Reproductive/Obstetrics ? ?  ? ? ? ? ? ? ? ? ? ? ? ? ? ?  ?  ? ? ? ? ? ? ? ? ?Anesthesia Physical ?Anesthesia Plan ? ?ASA: 2 ? ?Anesthesia Plan: General  ? ?Post-op Pain Management:   ? ?Induction: Intravenous ? ?PONV Risk Score and Plan: 3 and Ondansetron, Dexamethasone, Midazolam and Treatment may vary due to age or medical condition ? ?Airway Management Planned: LMA ? ?Additional Equipment:  ? ?Intra-op Plan:  ? ?Post-operative Plan: Extubation in OR ? ?Informed Consent: I have reviewed the patients History and Physical, chart, labs and discussed the procedure including the risks, benefits and alternatives for the proposed anesthesia with the patient or authorized representative who has indicated his/her understanding and acceptance.  ? ? ? ?Dental advisory given ? ?Plan Discussed with: CRNA, Anesthesiologist and Surgeon ? ?Anesthesia Plan Comments:   ? ? ? ? ? ? ?Anesthesia Quick Evaluation ? ?

## 2021-05-13 NOTE — Op Note (Signed)
?  05/13/2021 ? ?8:12 AM ? ?PATIENT:  Kelsey Black  31 y.o. female ? ?PRE-OPERATIVE DIAGNOSIS: Internal hemorrhoids, external hemorrhoids ? ?POST-OPERATIVE DIAGNOSIS: Internal hemorrhoids, external hemorrhoids ? ?PROCEDURE:  Procedure(s): ?Internal hemorrhoidectomy 2 column ?External hemorrhoidectomy x2 ? ?SURGEON:  Surgeon(s): ?Violeta Gelinas, MD ? ?ASSISTANTS: none  ? ?ANESTHESIA:   local and general ? ?EBL:  Total I/O ?In: 500 [I.V.:500] ?Out: 5 [Blood:5] ? ?BLOOD ADMINISTERED:none ? ?DRAINS: none  ? ?SPECIMEN:  Excision ? ?DISPOSITION OF SPECIMEN:  PATHOLOGY ? ?COUNTS:  YES ? ?DICTATION: .Dragon Dictation ?Procedure in detail: Informed consent was obtained.  Patient received intravenous antibiotics.  She was brought to the operating room and general anesthesia was administered by the anesthesia staff.  She was placed in lithotomy position.  Her perianal region was prepped and draped in sterile fashion.  We did a timeout procedure.  I injected Exparel mixed with half percent Marcaine for perianal postoperative pain control.  Examination under anesthesia revealed external hemorrhoids anteriorly and posteriorly as well as associated internal hemorrhoids anteriorly and posteriorly.  No masses were seen.  Attention was directed posteriorly first.  I used the harmonic scalpel to incise the anoderm I then excised the external hemorrhoid and dissected outside of the sphincters to remove the hemorrhoidal vein of the internal hemorrhoid column.  This was done with the harmonic scalpel.  There was an additional internal portion towards the right side which also excised with the harmonic scalpel.  Attention was then directed anteriorly.  I excised the external hemorrhoid using the harmonic scalpel and then carefully dissected the hemorrhoidal vein up away from the sphincters using the harmonic scalpel and excised that internal hemorrhoid column.  Cautery was used to get excellent hemostasis.  No other significant  abnormalities were seen.  I placed a thrombin-soaked Gelfoam sponge for a postoperative hemostatic dressing and the rectum.  Sterile dressing was applied.  All counts were correct.  She tolerated the procedure well without apparent complication and was taken recovery in stable condition. ?PATIENT DISPOSITION:  PACU - hemodynamically stable. ?  ?Delay start of Pharmacological VTE agent (>24hrs) due to surgical blood loss or risk of bleeding:  no ? ?Violeta Gelinas, MD, MPH, FACS ?Pager: [phone]  ?3/28/20238:12 AM ? ? ? ? ? ? ? ? ? ? ? ? ?  ?

## 2021-05-13 NOTE — Transfer of Care (Signed)
Immediate Anesthesia Transfer of Care Note ? ?Patient: Kelsey Black ? ?Procedure(s) Performed: INTERNAL AND EXTERNAL HEMORRHOIDECTOMY (Rectum) ? ?Patient Location: PACU ? ?Anesthesia Type:General ? ?Level of Consciousness: awake, alert  and oriented ? ?Airway & Oxygen Therapy: Patient Spontanous Breathing and Patient connected to nasal cannula oxygen ? ?Post-op Assessment: Report given to RN, Post -op Vital signs reviewed and stable and Patient moving all extremities ? ?Post vital signs: Reviewed and stable ? ?Last Vitals:  ?Vitals Value Taken Time  ?BP 114/44 05/13/21 0830  ?Temp    ?Pulse 116 05/13/21 0830  ?Resp 21 05/13/21 0831  ?SpO2 98 % 05/13/21 0830  ?Vitals shown include unvalidated device data. ? ?Last Pain:  ?Vitals:  ? 05/13/21 0549  ?TempSrc: Oral  ?PainSc: 4   ?   ? ?  ? ?Complications: No notable events documented. ?

## 2021-05-13 NOTE — H&P (Addendum)
Kelsey Black is an 31 y.o. female.   ?Chief Complaint: hemorrhoids ?HPI: Presents for internal and external hemorrhoidectomy. Symptoms have been the same since I saw her in the office. ? ?Past Medical History:  ?Diagnosis Date  ? Anemia   ? Depression   ? Family history of adverse reaction to anesthesia   ? mother woke up during abdominal surgery 3 years ago  ? Family history of Black cancer 04/04/2021  ? Nausea & vomiting   ? last month  ? Pregnancy   ? 19 weeks and few days michelle horvath ob dr  ? Symptomatic cholelithiasis   ? ? ?Past Surgical History:  ?Procedure Laterality Date  ? CHOLECYSTECTOMY N/A 11/16/2016  ? Procedure: LAPAROSCOPIC CHOLECYSTECTOMY;  Surgeon: Andria Meuse, MD;  Location: [facility]S;  Service: General;  Laterality: N/A;  ? TOOTH EXTRACTION    ? ? ?Family History  ?Problem Relation Age of Onset  ? Asthma Mother   ? Asthma Father   ? Epilepsy Father   ? Diabetes Father   ? Asthma Sister   ? Asthma Brother   ? Breast cancer Maternal Aunt 54  ? Diabetes Maternal Grandfather   ? Hypertension Maternal Grandfather   ? Black cancer Maternal Grandfather 30  ? ?Social History:  reports that she has never smoked. She has never used smokeless tobacco. She reports current alcohol use. She reports that she does not use drugs. ? ?Allergies:  ?Allergies  ?Allergen Reactions  ? Penicillins Swelling and Other (See Comments)  ?  Reaction:  Arm/leg swelling  ?Has patient had a PCN reaction causing immediate rash, facial/tongue/throat swelling, SOB or lightheadedness with hypotension: Yes ?Has patient had a PCN reaction causing severe rash involving mucus membranes or skin necrosis: No ?Has patient had a PCN reaction that required hospitalization: No ?Has patient had a PCN reaction occurring within the last 10 years: No ?If all of the above answers are "NO", then may proceed with Cephalosporin use.  ? Shrimp [Shellfish Allergy] Swelling and Other (See Comments)  ?  Localized swelling   ? ? ?Medications Prior to Admission  ?Medication Sig Dispense Refill  ? Diethylpropion HCl CR 75 MG TB24 Take 75 mg by mouth daily.    ? EPIPEN 2-PAK 0.3 MG/0.3ML SOAJ injection Inject 0.3 mg into the muscle as needed for anaphylaxis.    ? fluticasone (FLONASE) 50 MCG/ACT nasal spray Place 2 sprays into both nostrils daily for 7 days. (Patient taking differently: Place 2 sprays into both nostrils daily as needed for allergies.) 1 g 0  ? hydrOXYzine (VISTARIL) 25 MG capsule Take 25 mg by mouth at bedtime as needed for sleep.    ? ibuprofen (ADVIL) 800 MG tablet Take 1 tablet (800 mg total) by mouth 3 (three) times daily. (Patient taking differently: Take 800 mg by mouth every 8 (eight) hours as needed for moderate pain.) 21 tablet 0  ? LARIN 24 FE 1-20 MG-MCG(24) tablet Take 1 tablet by mouth daily.    ? levocetirizine (XYZAL) 5 MG tablet Take 5 mg by mouth daily as needed for allergies.    ? promethazine (PHENERGAN) 25 MG tablet Take 25 mg by mouth every 6 (six) hours as needed for nausea/vomiting.    ? sertraline (ZOLOFT) 50 MG tablet Take 50 mg by mouth daily as needed for anxiety.    ? albuterol (VENTOLIN HFA) 108 (90 Base) MCG/ACT inhaler Inhale 2 puffs into the lungs every 6 (six) hours as needed for wheezing or shortness of breath.  8 g 0  ? benzonatate (TESSALON) 100 MG capsule Take 1 capsule (100 mg total) by mouth 3 (three) times daily as needed for cough. (Patient not taking: Reported on 04/29/2021) 21 capsule 0  ? cyclobenzaprine (FLEXERIL) 10 MG tablet Take 1 tablet (10 mg total) by mouth 3 (three) times daily as needed for muscle spasms. (Patient not taking: Reported on 04/29/2021) 10 tablet 0  ? HYDROcodone-acetaminophen (NORCO/VICODIN) 5-325 MG tablet Take 1-2 tablets by mouth every 6 (six) hours as needed. (Patient not taking: Reported on 06/07/2019) 10 tablet 0  ? ondansetron (ZOFRAN) 4 MG tablet Take 1 tablet (4 mg total) by mouth every 8 (eight) hours as needed for nausea or vomiting. (Patient not  taking: Reported on 04/29/2021) 20 tablet 0  ? predniSONE (DELTASONE) 20 MG tablet Take 2 tablets (40 mg total) by mouth daily with breakfast for 5 days. 2 po daily for 5 days 10 tablet 0  ? promethazine-dextromethorphan (PROMETHAZINE-DM) 6.25-15 MG/5ML syrup Take 5 mLs by mouth 4 (four) times daily as needed for cough. 118 mL 0  ? ? ?Results for orders placed or performed during the hospital encounter of 05/13/21 (from the past 48 hour(s))  ?Pregnancy, urine POC     Status: None  ? Collection Time: 05/13/21  6:28 AM  ?Result Value Ref Range  ? Preg Test, Ur NEGATIVE NEGATIVE  ?  Comment:        ?THE SENSITIVITY OF THIS ?METHODOLOGY IS >24 mIU/mL ?  ? ?No results found. ? ?Review of Systems ? ?Blood pressure 129/61, pulse 76, temperature 98.9 ?F (37.2 ?C), temperature source Oral, resp. rate 18, height 5\' 5"  (1.651 m), weight 83.9 kg, last menstrual period 03/15/2021, SpO2 100 %, unknown if currently breastfeeding. ?Physical Exam ?Constitutional:   ?   Appearance: Normal appearance.  ?HENT:  ?   Right Ear: External ear normal.  ?   Left Ear: External ear normal.  ?   Nose: Nose normal.  ?Eyes:  ?   Pupils: Pupils are equal, round, and reactive to light.  ?Cardiovascular:  ?   Rate and Rhythm: Normal rate and regular rhythm.  ?   Pulses: Normal pulses.  ?   Heart sounds: Normal heart sounds.  ?Pulmonary:  ?   Effort: Pulmonary effort is normal.  ?   Breath sounds: Normal breath sounds.  ?Abdominal:  ?   General: Abdomen is flat.  ?   Palpations: Abdomen is soft.  ?   Tenderness: There is no abdominal tenderness.  ?Musculoskeletal:     ?   General: Normal range of motion.  ?Skin: ?   General: Skin is warm.  ?Neurological:  ?   Mental Status: She is alert and oriented to person, place, and time.  ?  ? ?Assessment/Plan ?Internal and external hemorrhoids - for internal and external hemorrhoidectomy. Procedure, risks ,and benefits discussed again and she agrees. I also discussed the expected post-op course. ? ?03/17/2021, MD ?05/13/2021, 6:49 AM ? ? ? ?

## 2021-05-14 ENCOUNTER — Encounter (HOSPITAL_COMMUNITY): Payer: Self-pay | Admitting: General Surgery

## 2021-05-14 ENCOUNTER — Other Ambulatory Visit: Payer: Self-pay

## 2021-05-14 ENCOUNTER — Emergency Department (HOSPITAL_COMMUNITY)
Admission: EM | Admit: 2021-05-14 | Discharge: 2021-05-14 | Disposition: A | Discharge status: Home / Self Care | Payer: Medicaid Other | Attending: Emergency Medicine | Admitting: Emergency Medicine

## 2021-05-14 ENCOUNTER — Emergency Department (HOSPITAL_COMMUNITY): Payer: Medicaid Other

## 2021-05-14 DIAGNOSIS — D72829 Elevated white blood cell count, unspecified: Secondary | ICD-10-CM | POA: Diagnosis not present

## 2021-05-14 DIAGNOSIS — K625 Hemorrhage of anus and rectum: Secondary | ICD-10-CM | POA: Diagnosis not present

## 2021-05-14 LAB — CBC
HCT: 38.3 % (ref 36.0–46.0)
Hemoglobin: 11.6 g/dL — ABNORMAL LOW (ref 12.0–15.0)
MCH: 26.4 pg (ref 26.0–34.0)
MCHC: 30.3 g/dL (ref 30.0–36.0)
MCV: 87.2 fL (ref 80.0–100.0)
Platelets: 377 10*3/uL (ref 150–400)
RBC: 4.39 MIL/uL (ref 3.87–5.11)
RDW: 15.4 % (ref 11.5–15.5)
WBC: 11.5 10*3/uL — ABNORMAL HIGH (ref 4.0–10.5)
nRBC: 0 % (ref 0.0–0.2)

## 2021-05-14 LAB — URINALYSIS, ROUTINE W REFLEX MICROSCOPIC
Bilirubin Urine: NEGATIVE
Glucose, UA: NEGATIVE mg/dL
Ketones, ur: NEGATIVE mg/dL
Leukocytes,Ua: NEGATIVE
Nitrite: NEGATIVE
Protein, ur: NEGATIVE mg/dL
Specific Gravity, Urine: 1.02 (ref 1.005–1.030)
pH: 6 (ref 5.0–8.0)

## 2021-05-14 LAB — COMPREHENSIVE METABOLIC PANEL
ALT: 17 U/L (ref 0–44)
AST: 18 U/L (ref 15–41)
Albumin: 3.5 g/dL (ref 3.5–5.0)
Alkaline Phosphatase: 48 U/L (ref 38–126)
Anion gap: 7 (ref 5–15)
BUN: 7 mg/dL (ref 6–20)
CO2: 24 mmol/L (ref 22–32)
Calcium: 8.8 mg/dL — ABNORMAL LOW (ref 8.9–10.3)
Chloride: 107 mmol/L (ref 98–111)
Creatinine, Ser: 0.8 mg/dL (ref 0.44–1.00)
GFR, Estimated: >60 mL/min (ref >60)
Glucose, Bld: 106 mg/dL — ABNORMAL HIGH (ref 70–99)
Potassium: 3.9 mmol/L (ref 3.5–5.1)
Sodium: 138 mmol/L (ref 135–145)
Total Bilirubin: 0.6 mg/dL (ref 0.3–1.2)
Total Protein: 7 g/dL (ref 6.5–8.1)

## 2021-05-14 LAB — URINALYSIS, MICROSCOPIC (REFLEX)

## 2021-05-14 LAB — SURGICAL PATHOLOGY

## 2021-05-14 LAB — LIPASE, BLOOD: Lipase: 27 U/L (ref 11–51)

## 2021-05-14 LAB — TROPONIN I (HIGH SENSITIVITY): Troponin I (High Sensitivity): <2 ng/L (ref <18)

## 2021-05-14 NOTE — Consult Note (Signed)
?  Patient hysterectomy status post internal and external hemorrhoidectomy yesterday.  She presented to the ED complaining of some bleeding and pain.  Hemoglobin is stable.  On examination, her wound looks as expected without any active bleeding whatsoever.  I did place a Gelfoam sponge within her anal cavity.  Okay for discharge.  I recommend she alternate Tylenol with her pain medication and I will have my office call her tomorrow to check on her. ? ?Violeta Gelinas, MD, MPH, FACS ?Please use [email or web address] to contact on call provider ? ?

## 2021-05-14 NOTE — Discharge Instructions (Signed)
Please continue to follow up with your surgery team as scheduled. Return with any new or worsening symptoms.  ?

## 2021-05-14 NOTE — Anesthesia Postprocedure Evaluation (Signed)
Anesthesia Post Note ? ?Patient: Kelsey Black ? ?Procedure(s) Performed: INTERNAL AND EXTERNAL HEMORRHOIDECTOMY (Rectum) ? ?  ? ?Patient location during evaluation: PACU ?Anesthesia Type: General ?Level of consciousness: awake and alert ?Pain management: pain level controlled ?Vital Signs Assessment: post-procedure vital signs reviewed and stable ?Respiratory status: spontaneous breathing, nonlabored ventilation, respiratory function stable and patient connected to nasal cannula oxygen ?Cardiovascular status: blood pressure returned to baseline and stable ?Postop Assessment: no apparent nausea or vomiting ?Anesthetic complications: no ? ? ?No notable events documented. ? ?Last Vitals:  ?Vitals:  ? 05/13/21 0845 05/13/21 0858  ?BP: 114/62 113/65  ?Pulse: 93 79  ?Resp:    ?Temp:  36.8 ?C  ?SpO2: 98% 95%  ?  ?Last Pain:  ?Vitals:  ? 05/13/21 0830  ?TempSrc:   ?PainSc: 0-No pain  ? ? ?  ?  ?  ?  ?  ?  ? ?Rilee Knoll S ? ? ? ? ?

## 2021-05-14 NOTE — ED Provider Notes (Signed)
? ?Emergency Department Provider Note ? ? ?I have reviewed the triage vital signs and the nursing notes. ? ? ?HISTORY ? ?Chief Complaint ?Rectal Bleeding ? ? ?HPI ?Kelsey Black is a 31 y.o. female with past history reviewed below presents to the emergency department for evaluation of rectal bleeding. Patient is currently POD 1 s/p hemorrhoidectomy with Dr. Janee Morn.  Patient states that after the procedure she had some spotting but then turned into heavy bleeding overnight which soaked through her pad.  She has had to change her pad every 1-2 hours throughout the day although feels like its starting to slow slightly.  She is feeling some fatigue.  Denies fever. Does have some post-op pain but nothing severe.  ? ? ?Past Medical History:  ?Diagnosis Date  ? Anemia   ? Depression   ? Family history of adverse reaction to anesthesia   ? mother woke up during abdominal surgery 3 years ago  ? Family history of Black cancer 04/04/2021  ? Nausea & vomiting   ? last month  ? Pregnancy   ? 19 weeks and few days Kelsey Black ob dr  ? Symptomatic cholelithiasis   ? ? ?Review of Systems ? ?Constitutional: No fever/chills ?Eyes: No visual changes. ?ENT: No sore throat. ?Cardiovascular: Denies chest pain. ?Respiratory: Denies shortness of breath. ?Gastrointestinal: No abdominal pain.  No nausea, no vomiting.  No diarrhea.  No constipation. Positive rectal bleeding in the post-op setting.  ?Genitourinary: Negative for dysuria. ?Musculoskeletal: Negative for back pain. ?Skin: Negative for rash. ?Neurological: Negative for headaches, focal weakness or numbness. ? ? ?____________________________________________ ? ? ?PHYSICAL EXAM: ? ?VITAL SIGNS: ?ED Triage Vitals  ?Enc Vitals Group  ?   BP 05/14/21 1259 120/67  ?   Pulse Rate 05/14/21 1259 74  ?   Resp 05/14/21 1259 16  ?   Temp 05/14/21 1259 98.5 ?F (36.9 ?C)  ?   Temp Source 05/14/21 1259 Oral  ?   SpO2 05/14/21 1259 100 %  ?   Weight 05/14/21 1306 195 lb (88.5 kg)   ?   Height 05/14/21 1306 5\' 5"  (1.651 m)  ? ? ?Constitutional: Alert and oriented. Well appearing and in no acute distress. ?Eyes: Conjunctivae are normal.  ?Head: Atraumatic. ?Nose: No congestion/rhinnorhea. ?Mouth/Throat: Mucous membranes are moist.   ?Neck: No stridor.   ?Cardiovascular: Good peripheral circulation.   ?Respiratory: Normal respiratory effort.   ?Gastrointestinal: Soft and nontender. No distention.  Rectal exam performed with patient's verbal consent and nurse chaperone.  Postoperative site appears hemostatic. No oozing blood or melena.  ?Musculoskeletal: No gross deformities of extremities. ?Neurologic:  Normal speech and language.  ?Skin:  Skin is warm, dry and intact. No rash noted. ? ?____________________________________________ ?  ?LABS ?(all labs ordered are listed, but only abnormal results are displayed) ? ?Labs Reviewed  ?CBC - Abnormal; Notable for the following components:  ?    Result Value  ? WBC 11.5 (*)   ? Hemoglobin 11.6 (*)   ? All other components within normal limits  ?COMPREHENSIVE METABOLIC PANEL - Abnormal; Notable for the following components:  ? Glucose, Bld 106 (*)   ? Calcium 8.8 (*)   ? All other components within normal limits  ?URINALYSIS, ROUTINE W REFLEX MICROSCOPIC - Abnormal; Notable for the following components:  ? Hgb urine dipstick LARGE (*)   ? All other components within normal limits  ?URINALYSIS, MICROSCOPIC (REFLEX) - Abnormal; Notable for the following components:  ? Bacteria, UA FEW (*)   ?  All other components within normal limits  ?LIPASE, BLOOD  ?TROPONIN I (HIGH SENSITIVITY)  ?TROPONIN I (HIGH SENSITIVITY)  ? ?____________________________________________ ? ?EKG ? ?No EKG ? ?____________________________________________ ? ?RADIOLOGY ? ?DG Chest 1 View ? ?Result Date: 05/14/2021 ?CLINICAL DATA:  Chest pain EXAM: CHEST  1 VIEW COMPARISON:  03/13/2018 FINDINGS: The heart size and mediastinal contours are within normal limits. Both lungs are clear. The  visualized skeletal structures are unremarkable. IMPRESSION: No acute abnormality of the lungs in frontal projection. Electronically Signed   By: Jearld Lesch M.D.   On: 05/14/2021 13:35   ? ?____________________________________________ ? ? ?PROCEDURES ? ?Procedure(s) performed:  ? ?Procedures ? ?None  ?____________________________________________ ? ? ?INITIAL IMPRESSION / ASSESSMENT AND PLAN / ED COURSE ? ?Pertinent labs & imaging results that were available during my care of the patient were reviewed by me and considered in my medical decision making (see chart for details). ? ? ?I decided to review pertinent External Data, and in summary patient with hemorrhoidectomy yesterday with Dr. Janee Morn. ?  ?Clinical Laboratory Tests Ordered, included hemoglobin of 11.6 similar to preop values.  WBC count mildly elevated, likely postoperative acute phase reactant.  Creatinine is normal. ? ?Radiologic Tests Ordered, included CXR. I independently interpreted the images and agree with radiology interpretation.  ? ?Cardiac Monitor Tracing which shows NSR. ? ? ?Social Determinants of Health Risk patient is a non-smoker. ? ?Consult complete with Dr. Janee Morn. Has seen patient and arranged follow up.  ? ?Medical Decision Making: Summary:  ?Patient presents to the emergency department with rectal bleeding status post hemorrhoidectomy.  Differential includes expected postoperative bleed, wound dehiscence, hematoma collection, colitis.  Patient's surgical site looks very well.  No active bleeding here.  Contacted the surgery team and they came to the bedside to evaluate.  See consult note.  ? ?Patient cleared for discharge.  We will follow with the surgery team as scheduled. ? ?Reevaluation with update and discussion with patient. Plan for discharge with continued wound mgmt at home and surgery follow up.  ? ?Disposition: discharge ? ?____________________________________________ ? ?FINAL CLINICAL IMPRESSION(S) / ED  DIAGNOSES ? ?Final diagnoses:  ?Rectal bleeding  ? ? ? ?Note:  This document was prepared using Dragon voice recognition software and may include unintentional dictation errors. ? ?Alona Bene, MD, FACEP ?Emergency Medicine ? ?  ?Maia Plan, MD ?05/14/21 1641 ? ?

## 2021-05-14 NOTE — ED Provider Triage Note (Signed)
Emergency Medicine Provider Triage Evaluation Note ? ?Kelsey Black , a 31 y.o. female  was evaluated in triage.  Pt complains of multiple complaints.  Says she had a surgery yesterday to get hemorrhoids fixed.  Apparently there were no known complications.  She said as soon as she left she started having nausea and vomiting.  Last night she noticed central chest pain that does not radiate.  But does go across her chest.  She describes it as pressure.  She also has had worsening lower back pain that radiates up into her mid back.  She also has headache.  Of note, she has had rectal bleeding that has been worsened after the procedure.  She has filled up 1 total pad. ? ?Review of Systems  ?Positive: Chest pain, headache, nausea, vomiting, rectal bleeding, back pain ?Negative:  ? ?Physical Exam  ?BP 120/67 (BP Location: Right Arm)   Pulse 74   Temp 98.5 ?F (36.9 ?C) (Oral)   Resp 16   Ht 5\' 5"  (1.651 m)   Wt 88.5 kg   SpO2 100%   BMI 32.45 kg/m?  ?Gen:   Awake, no distress   ?Resp:  Normal effort  ?MSK:   Moves extremities without difficulty  ?Other:   ? ?Medical Decision Making  ?Medically screening exam initiated at 1:14 PM.  Appropriate orders placed.  Kelsey Black was informed that the remainder of the evaluation will be completed by another provider, this initial triage assessment does not replace that evaluation, and the importance of remaining in the ED until their evaluation is complete. ? ? ?  ?Kelsey Bryant, PA-C ?05/14/21 1318 ? ?

## 2021-05-14 NOTE — ED Triage Notes (Signed)
Pt arrived POV from home c/o headache CP and excessive rectal bleeding. Pt had surgery done to remove hemorrhoids yesterday and is worried she is having complications.   ?

## 2021-05-17 ENCOUNTER — Other Ambulatory Visit: Payer: Self-pay

## 2021-05-17 ENCOUNTER — Encounter (HOSPITAL_COMMUNITY): Payer: Self-pay | Admitting: Emergency Medicine

## 2021-05-17 ENCOUNTER — Emergency Department (HOSPITAL_COMMUNITY)
Admission: EM | Admit: 2021-05-17 | Discharge: 2021-05-17 | Disposition: A | Discharge status: Home / Self Care | Payer: Medicaid Other | Attending: Emergency Medicine | Admitting: Emergency Medicine

## 2021-05-17 DIAGNOSIS — R55 Syncope and collapse: Secondary | ICD-10-CM | POA: Insufficient documentation

## 2021-05-17 DIAGNOSIS — K6289 Other specified diseases of anus and rectum: Secondary | ICD-10-CM | POA: Insufficient documentation

## 2021-05-17 LAB — CBC
HCT: 38.6 % (ref 36.0–46.0)
Hemoglobin: 12.1 g/dL (ref 12.0–15.0)
MCH: 26.9 pg (ref 26.0–34.0)
MCHC: 31.3 g/dL (ref 30.0–36.0)
MCV: 85.8 fL (ref 80.0–100.0)
Platelets: 371 10*3/uL (ref 150–400)
RBC: 4.5 MIL/uL (ref 3.87–5.11)
RDW: 15.2 % (ref 11.5–15.5)
WBC: 12.8 10*3/uL — ABNORMAL HIGH (ref 4.0–10.5)
nRBC: 0 % (ref 0.0–0.2)

## 2021-05-17 LAB — URINALYSIS, ROUTINE W REFLEX MICROSCOPIC
Bilirubin Urine: NEGATIVE
Glucose, UA: NEGATIVE mg/dL
Ketones, ur: NEGATIVE mg/dL
Nitrite: NEGATIVE
Protein, ur: NEGATIVE mg/dL
RBC / HPF: >50 RBC/hpf — ABNORMAL HIGH (ref 0–5)
Specific Gravity, Urine: 1.008 (ref 1.005–1.030)
pH: 6 (ref 5.0–8.0)

## 2021-05-17 LAB — I-STAT BETA HCG BLOOD, ED (MC, WL, AP ONLY): I-stat hCG, quantitative: <5 m[IU]/mL (ref <5)

## 2021-05-17 LAB — BASIC METABOLIC PANEL
Anion gap: 6 (ref 5–15)
BUN: 7 mg/dL (ref 6–20)
CO2: 27 mmol/L (ref 22–32)
Calcium: 8.5 mg/dL — ABNORMAL LOW (ref 8.9–10.3)
Chloride: 104 mmol/L (ref 98–111)
Creatinine, Ser: 0.77 mg/dL (ref 0.44–1.00)
GFR, Estimated: >60 mL/min (ref >60)
Glucose, Bld: 100 mg/dL — ABNORMAL HIGH (ref 70–99)
Potassium: 4.6 mmol/L (ref 3.5–5.1)
Sodium: 137 mmol/L (ref 135–145)

## 2021-05-17 MED ORDER — SODIUM CHLORIDE 0.9 % IV BOLUS: 1000.0000 mL | Freq: Once | INTRAVENOUS | Status: DC

## 2021-05-17 MED ORDER — SODIUM CHLORIDE 0.9% FLUSH: 3.0000 mL | Freq: Once | INTRAVENOUS | Status: DC

## 2021-05-17 MED ORDER — SODIUM CHLORIDE 0.9 % IV BOLUS
1000.0000 mL | Freq: Once | INTRAVENOUS | Status: AC
  Administered 2021-05-17: 1000 mL via INTRAVENOUS

## 2021-05-17 MED ORDER — MORPHINE SULFATE (PF) 4 MG/ML IV SOLN
4.0000 mg | Freq: Once | INTRAVENOUS | Status: AC
  Administered 2021-05-17: 4 mg via INTRAVENOUS
  Filled 2021-05-17: qty 1

## 2021-05-17 MED ORDER — LIDOCAINE HCL 4 % EX SOLN
Freq: Once | CUTANEOUS | Status: AC
  Filled 2021-05-17: qty 50

## 2021-05-17 MED ORDER — KETOROLAC TROMETHAMINE 30 MG/ML IJ SOLN
30.0000 mg | Freq: Once | INTRAMUSCULAR | Status: AC
  Administered 2021-05-17: 30 mg via INTRAVENOUS
  Filled 2021-05-17: qty 1

## 2021-05-17 MED ORDER — POLYETHYLENE GLYCOL 3350 17 G PO PACK: 17.0000 g | PACK | Freq: Every day | ORAL | 0 refills | Status: DC

## 2021-05-17 MED ORDER — LIDOCAINE 5 % EX OINT: 1.0000 "application " | TOPICAL_OINTMENT | CUTANEOUS | 0 refills | Status: DC | PRN

## 2021-05-17 MED ORDER — SENNOSIDES-DOCUSATE SODIUM 8.6-50 MG PO TABS: 2.0000 | ORAL_TABLET | Freq: Every day | ORAL | 0 refills | Status: AC

## 2021-05-17 MED ORDER — METOCLOPRAMIDE HCL 5 MG/ML IJ SOLN
10.0000 mg | Freq: Once | INTRAMUSCULAR | Status: AC
  Administered 2021-05-17: 10 mg via INTRAVENOUS
  Filled 2021-05-17: qty 2

## 2021-05-17 NOTE — ED Triage Notes (Signed)
Pt reports syncopal episode on toilet this morning.  Reports feeling dizzy prior to syncopal episode and reports nausea.  Pt had hemorrhoid surgery on Tuesday.  Taking stool softener.  Last BM on Tuesday.  C/o rectal pain. ?

## 2021-05-17 NOTE — Progress Notes (Signed)
Patient ID: Kelsey Black, female   DOB: 1990/07/07, 30 y.o.   MRN: [id number] ? ?Patient is status post hemorrhoidectomy x2 columns both internal and externally. ?Patient with syncopal episodes today while on the toilet and straining. ?Patient continues with oxycodone for pain management.  She states that she is been in significant mount of pain.  She has not tried any sitz bath's.  Patient on stool softeners. ? ?Patient CBC appears appropriate. ? ?I discussed with the patient and her mother that she would benefit from MiraLAX 2-3 times a day to help with bowel movements.  I did discuss with her that her oxycodone use likely is causing some constipation issues.  I did discuss with him that her pain is in line with what is expected after hemorrhoidectomy.  This will improve after several weeks. ? ?We also discussed sitz bath as much she can tolerate to help with the discomfort. ? ?Otherwise patient can follow-up as needed.  I discussed this with Dr. Jeraldine Loots. ?

## 2021-05-17 NOTE — ED Provider Notes (Signed)
?[facility] EMERGENCY DEPARTMENT ?Provider Note ? ? ?CSN: [id number] ?Arrival date & time: 05/17/21  8250 ? ?  ? ?History ? ?Chief Complaint  ?Patient presents with  ? Loss of Consciousness  ? ? ?Kelsey Black is a 31 y.o. female. ? ?HPI ?Patient presents 3 days after being seen for rectal bleeding, 4 days after hemorrhoidectomy now with concern for an episode of syncope.  She is here with her sister assists with the history.  Details as per op note, as well. ?She notes that after her last ED visit she was recovering generally well today her however, she went to the bathroom, felt lightheaded while sitting on the commode.  She soon thereafter found her self on the ground.  She has pain around her rectum, but no abdominal pain, head pain, neck pain, chest pain. ?No history of prior syncope.  She continues to have bleeding.  Today was the first time she was trying to defecate following the procedure. ?  ? ?Home Medications ?Prior to Admission medications   ?Medication Sig Start Date End Date Taking? Authorizing Provider  ?acetaminophen (TYLENOL) 500 MG tablet Take 1,000 mg by mouth every 6 (six) hours as needed for mild pain.   Yes [provider]  ?albuterol (VENTOLIN HFA) 108 (90 Base) MCG/ACT inhaler Inhale 2 puffs into the lungs every 6 (six) hours as needed for wheezing or shortness of breath. 05/09/21  Yes Bennie Pierini, FNP  ?Diethylpropion HCl CR 75 MG TB24 Take 75 mg by mouth daily. 02/27/21  Yes [provider]  ?EPIPEN 2-PAK 0.3 MG/0.3ML SOAJ injection Inject 0.3 mg into the muscle as needed for anaphylaxis. 03/10/21  Yes [provider]  ?fluticasone (FLONASE) 50 MCG/ACT nasal spray Place 2 sprays into both nostrils daily for 7 days. ?Patient taking differently: Place 2 sprays into both nostrils daily as needed for allergies. 01/14/21 05/17/21 Yes Long, Arlyss Repress, MD  ?hydrOXYzine (VISTARIL) 25 MG capsule Take 25 mg by mouth at bedtime as needed for  sleep. 04/03/21  Yes [provider]  ?LARIN 24 FE 1-20 MG-MCG(24) tablet Take 1 tablet by mouth daily. 03/19/21  Yes [provider]  ?levocetirizine (XYZAL) 5 MG tablet Take 5 mg by mouth daily as needed for allergies.   Yes [provider]  ?lidocaine (XYLOCAINE) 5 % ointment Apply 1 application. topically as needed. 05/17/21  Yes Gerhard Munch, MD  ?ondansetron (ZOFRAN) 4 MG tablet Take 1 tablet (4 mg total) by mouth every 8 (eight) hours as needed for nausea or vomiting. 06/07/19  Yes Tegeler, Canary Brim, MD  ?oxyCODONE (OXY IR/ROXICODONE) 5 MG immediate release tablet Take 1 tablet (5 mg total) by mouth every 6 (six) hours as needed for severe pain. 05/13/21  Yes Violeta Gelinas, MD  ?polyethylene glycol (MIRALAX / GLYCOLAX) 17 g packet Take 17 g by mouth daily. 05/17/21  Yes Gerhard Munch, MD  ?promethazine (PHENERGAN) 25 MG tablet Take 25 mg by mouth every 6 (six) hours as needed for nausea/vomiting. 03/20/21  Yes [provider]  ?promethazine-dextromethorphan (PROMETHAZINE-DM) 6.25-15 MG/5ML syrup Take 5 mLs by mouth 4 (four) times daily as needed for cough. 05/09/21  Yes Bennie Pierini, FNP  ?senna-docusate (SENOKOT-S) 8.6-50 MG tablet Take 2 tablets by mouth daily for 10 days. 05/17/21 05/27/21 Yes Gerhard Munch, MD  ?sertraline (ZOLOFT) 50 MG tablet Take 50 mg by mouth daily as needed for anxiety. 03/06/21  Yes [provider]  ?famotidine (PEPCID) 20 MG tablet Take 1 tablet (20 mg  total) by mouth 2 (two) times daily. ?Patient not taking: Reported on 11/02/2017 11/06/16 04/24/19  Hurshel Party, CNM  ?   ? ?Allergies    ?Penicillins and Shrimp [shellfish allergy]   ? ?Review of Systems   ?Review of Systems  ?Constitutional:   ?     Per HPI, otherwise negative  ?HENT:    ?     Per HPI, otherwise negative  ?Respiratory:    ?     Per HPI, otherwise negative  ?Cardiovascular:   ?     Per HPI, otherwise negative  ?Gastrointestinal:  Negative for vomiting.   ?Endocrine:  ?     Negative aside from HPI  ?Genitourinary:   ?     Neg aside from HPI   ?Musculoskeletal:   ?     Per HPI, otherwise negative  ?Skin: Negative.   ?Neurological:  Positive for syncope.  ? ?Physical Exam ?Updated Vital Signs ?BP (!) 101/50   Pulse 66   Temp 97.7 ?F (36.5 ?C) (Oral)   Resp 16   LMP 05/17/2021   SpO2 100%  ?Physical Exam ?Vitals and nursing note reviewed.  ?Constitutional:   ?   General: She is not in acute distress. ?   Appearance: She is well-developed.  ?HENT:  ?   Head: Normocephalic and atraumatic.  ?Eyes:  ?   Conjunctiva/sclera: Conjunctivae normal.  ?Cardiovascular:  ?   Rate and Rhythm: Normal rate and regular rhythm.  ?Pulmonary:  ?   Effort: Pulmonary effort is normal. No respiratory distress.  ?   Breath sounds: Normal breath sounds. No stridor.  ?Abdominal:  ?   General: There is no distension.  ?   Tenderness: There is abdominal tenderness.  ?Skin: ?   General: Skin is warm and dry.  ?Neurological:  ?   Mental Status: She is alert and oriented to person, place, and time.  ?   Cranial Nerves: No cranial nerve deficit.  ?Psychiatric:     ?   Mood and Affect: Mood normal.  ? ? ?ED Results / Procedures / Treatments   ?Labs ?(all labs ordered are listed, but only abnormal results are displayed) ?Labs Reviewed  ?BASIC METABOLIC PANEL - Abnormal; Notable for the following components:  ?    Result Value  ? Glucose, Bld 100 (*)   ? Calcium 8.5 (*)   ? All other components within normal limits  ?CBC - Abnormal; Notable for the following components:  ? WBC 12.8 (*)   ? All other components within normal limits  ?URINALYSIS, ROUTINE W REFLEX MICROSCOPIC - Abnormal; Notable for the following components:  ? APPearance HAZY (*)   ? Hgb urine dipstick LARGE (*)   ? Leukocytes,Ua SMALL (*)   ? RBC / HPF >50 (*)   ? Bacteria, UA RARE (*)   ? All other components within normal limits  ?I-STAT BETA HCG BLOOD, ED ([facility], [facility], [facility] ONLY)  ? ? ?EKG ?EKG Interpretation ? ?Date/Time:  Saturday  May 17 2021 07:34:30 EDT ?Ventricular Rate:  77 ?PR Interval:  116 ?QRS Duration: 99 ?QT Interval:  373 ?QTC Calculation: 423 ?R Axis:   61 ?Text Interpretation: Sinus rhythm Borderline short PR interval Baseline wander Abnormal ECG Confirmed by Gerhard Munch [phone]) on 05/17/2021 9:58:00 AM ? ?Radiology ?No results found. ? ?Procedures ?Procedures  ? ? ?Medications Ordered in ED ?Medications  ?sodium chloride flush (NS) 0.9 % injection 3 mL (0 mLs Intravenous Hold 05/17/21 0827)  ?morphine (PF) 4 MG/ML injection  4 mg (4 mg Intravenous Given 05/17/21 0849)  ?sodium chloride 0.9 % bolus 1,000 mL (0 mLs Intravenous Stopped 05/17/21 0934)  ?lidocaine (XYLOCAINE) 4 % external solution ( Topical Given 05/17/21 1230)  ?sodium chloride 0.9 % bolus 1,000 mL (1,000 mLs Intravenous New Bag/Given 05/17/21 1226)  ?ketorolac (TORADOL) 30 MG/ML injection 30 mg (30 mg Intravenous Given 05/17/21 1228)  ?metoCLOPramide (REGLAN) injection 10 mg (10 mg Intravenous Given 05/17/21 1228)  ? ? ?ED Course/ Medical Decision Making/ A&P ?This patient with a Hx of recent hemorrhoidectomy, no prior syncope, no history of congenital cardiac disease, no genetic history presents to the ED for concern of syncope in the context of defecation, this involves an extensive number of treatment options, and is a complaint that carries with it a high risk of complications and morbidity.   ? ?The differential diagnosis includes vasovagal syncope, hypotension mediated syncope, cardiogenic, infectious, hemorrhagic etiology ? ? ?Social Determinants of Health: ? ?None, no barrier ? ?Additional history obtained: ? ?Additional history and/or information obtained from sister, op note, notable for details of HPI as above, operative note included below: ? ?Procedure in detail: Informed consent was obtained.  Patient received intravenous antibiotics.  She was brought to the operating room and general anesthesia was administered by the anesthesia staff.  She was placed in  lithotomy position.  Her perianal region was prepped and draped in sterile fashion.  We did a timeout procedure.  I injected Exparel mixed with half percent Marcaine for perianal postoperative pain control.  Examinati

## 2021-05-17 NOTE — ED Notes (Signed)
Per Chloe RN, pt "passed out while in the bathroom". Pt opened her eyes as soon as Actuary called her name. Pt was with mother in the bathroom. This RN check on pt when pt got back in the room. Pt ao x 4, NAD, vss. Family wants a scan done to check for internal bleeding, Dr. Vanita Panda notified. ?

## 2021-05-17 NOTE — ED Notes (Signed)
Pt unable to urinate at this time. Per pt, she's bleeding both vaginally and rectally. Inquired if pt is on her cycle, pt states "I dont know. Im not supposed to be. I am on birth control pills and I am not supposed to take the last pills and then I started bleeding". MD aware. ?

## 2021-05-17 NOTE — Discharge Instructions (Signed)
As discussed, and consider with Dr. Derrell Lolling, your ongoing weakness, lightheadedness are likely due to your recovery from your hemorrhoidectomy, and the recovery.  Please be sure to stay well-hydrated, drink plenty of fluids, including Gatorade as needed.  Take your stool softener as directed and do not hesitate to return here for concerning changes in your condition.  Otherwise, follow-up with your surgeon for outpatient follow-up as scheduled. ?

## 2021-05-17 NOTE — ED Notes (Signed)
Received pt, ao x 4. Ambulated to the bathroom with standby assist. Attempting to collect urine sample now.  ?

## 2021-05-20 ENCOUNTER — Other Ambulatory Visit: Payer: Self-pay

## 2021-05-20 ENCOUNTER — Encounter (HOSPITAL_COMMUNITY): Payer: Self-pay

## 2021-05-20 ENCOUNTER — Emergency Department (HOSPITAL_COMMUNITY)
Admission: EM | Admit: 2021-05-20 | Discharge: 2021-05-20 | Disposition: A | Discharge status: Home / Self Care | Payer: Medicaid Other | Attending: Student | Admitting: Student

## 2021-05-20 ENCOUNTER — Emergency Department (HOSPITAL_COMMUNITY): Payer: Medicaid Other

## 2021-05-20 DIAGNOSIS — G8918 Other acute postprocedural pain: Secondary | ICD-10-CM | POA: Insufficient documentation

## 2021-05-20 DIAGNOSIS — N939 Abnormal uterine and vaginal bleeding, unspecified: Secondary | ICD-10-CM | POA: Diagnosis not present

## 2021-05-20 DIAGNOSIS — K5903 Drug induced constipation: Secondary | ICD-10-CM | POA: Insufficient documentation

## 2021-05-20 DIAGNOSIS — R109 Unspecified abdominal pain: Secondary | ICD-10-CM | POA: Diagnosis present

## 2021-05-20 LAB — URINALYSIS, ROUTINE W REFLEX MICROSCOPIC
Bilirubin Urine: NEGATIVE
Glucose, UA: NEGATIVE mg/dL
Ketones, ur: 20 mg/dL — AB
Nitrite: NEGATIVE
Protein, ur: NEGATIVE mg/dL
Specific Gravity, Urine: 1.004 — ABNORMAL LOW (ref 1.005–1.030)
pH: 6 (ref 5.0–8.0)

## 2021-05-20 LAB — COMPREHENSIVE METABOLIC PANEL
ALT: 15 U/L (ref 0–44)
AST: 15 U/L (ref 15–41)
Albumin: 3.5 g/dL (ref 3.5–5.0)
Alkaline Phosphatase: 54 U/L (ref 38–126)
Anion gap: 8 (ref 5–15)
BUN: 5 mg/dL — ABNORMAL LOW (ref 6–20)
CO2: 27 mmol/L (ref 22–32)
Calcium: 8.9 mg/dL (ref 8.9–10.3)
Chloride: 103 mmol/L (ref 98–111)
Creatinine, Ser: 0.77 mg/dL (ref 0.44–1.00)
GFR, Estimated: >60 mL/min (ref >60)
Glucose, Bld: 90 mg/dL (ref 70–99)
Potassium: 4.2 mmol/L (ref 3.5–5.1)
Sodium: 138 mmol/L (ref 135–145)
Total Bilirubin: 0.6 mg/dL (ref 0.3–1.2)
Total Protein: 7.2 g/dL (ref 6.5–8.1)

## 2021-05-20 LAB — CBC
HCT: 37.4 % (ref 36.0–46.0)
Hemoglobin: 11.8 g/dL — ABNORMAL LOW (ref 12.0–15.0)
MCH: 26.9 pg (ref 26.0–34.0)
MCHC: 31.6 g/dL (ref 30.0–36.0)
MCV: 85.4 fL (ref 80.0–100.0)
Platelets: 372 10*3/uL (ref 150–400)
RBC: 4.38 MIL/uL (ref 3.87–5.11)
RDW: 15.1 % (ref 11.5–15.5)
WBC: 10.3 10*3/uL (ref 4.0–10.5)
nRBC: 0 % (ref 0.0–0.2)

## 2021-05-20 LAB — TYPE AND SCREEN
ABO/RH(D): O POS
Antibody Screen: NEGATIVE

## 2021-05-20 LAB — I-STAT BETA HCG BLOOD, ED (MC, WL, AP ONLY): I-stat hCG, quantitative: <5 m[IU]/mL (ref <5)

## 2021-05-20 MED ORDER — ONDANSETRON HCL 4 MG/2ML IJ SOLN
4.0000 mg | Freq: Once | INTRAMUSCULAR | Status: AC
  Administered 2021-05-20: 4 mg via INTRAVENOUS
  Filled 2021-05-20: qty 2

## 2021-05-20 MED ORDER — IOHEXOL 300 MG/ML  SOLN
100.0000 mL | Freq: Once | INTRAMUSCULAR | Status: AC | PRN
  Administered 2021-05-20: 100 mL via INTRAVENOUS

## 2021-05-20 MED ORDER — MORPHINE SULFATE (PF) 4 MG/ML IV SOLN
4.0000 mg | Freq: Once | INTRAVENOUS | Status: AC
  Administered 2021-05-20: 4 mg via INTRAVENOUS
  Filled 2021-05-20: qty 1

## 2021-05-20 MED ORDER — ONDANSETRON 4 MG PO TBDP: 4.0000 mg | ORAL_TABLET | Freq: Three times a day (TID) | ORAL | 0 refills | Status: DC | PRN

## 2021-05-20 NOTE — Discharge Instructions (Addendum)
You came to the emergency department today to be evaluated for your rectal pain, rectal bleeding, vaginal bleeding, and abdominal pain.  The CT scan of your abdomen showed no acute abnormalities.  You were evaluated by the general surgery team who advised to continue MiraLAX and follow-up with your appointment tomorrow. ? ?You were noted to have vaginal bleeding on exam.  Please follow-up closely with your OB/GYN doctor. ? ?Get help right away if: ?You faint. ?You have bleeding that soaks through a sanitary pad every hour. ?You have pain in the abdomen. ?You have a fever or chills. ?You become sweaty or weak. ?You pass large blood clots from your vagina. ?

## 2021-05-20 NOTE — ED Provider Notes (Signed)
?[facility] ?Provider Note ? ? ?CSN: [id number] ?Arrival date & time: 05/20/21  1222 ? ?  ? ?History ? ?Chief Complaint  ?Patient presents with  ? Rectal Bleeding  ? ? ?Jisela Barshinger Colon is a 31 y.o. female with pertinent history of anemia, status post cholecystectomy, status post hemorrhoid surgery 05/13/2021 performed by Dr. Grandville Silos.  Presents emergency department with a chief complaint of constipation, rectal pain, rectal bleeding, and vaginal bleeding.  Patient states that she has had bleeding since March 29.  Patient is unsure if she is having rectal or vaginal bleeding.  Patient reports that she is having a lot of rectal pain.  Patient reports that she is having to change her pad every hour.  Patient reports that she has noted yellowish discharge coming from her rectum.  Patient complains of lower abdominal cramping.  Cramping has been intermittent for the last week.  Patient reports that she has not had a bowel movement since the Tuesday before her last surgery.  Patient reports that she has been taking MiraLAX daily since 05/17/2021 with no bowel movement.  Patient endorses nausea, chills, and lightheadedness. ? ? ?Rectal Bleeding ?Associated symptoms: abdominal pain and light-headedness   ?Associated symptoms: no dizziness, no fever and no vomiting   ? ?  ? ?Home Medications ?Prior to Admission medications   ?Medication Sig Start Date End Date Taking? Authorizing Provider  ?acetaminophen (TYLENOL) 500 MG tablet Take 1,000 mg by mouth every 6 (six) hours as needed for mild pain.    [provider]  ?albuterol (VENTOLIN HFA) 108 (90 Base) MCG/ACT inhaler Inhale 2 puffs into the lungs every 6 (six) hours as needed for wheezing or shortness of breath. 05/09/21   Chevis Pretty, FNP  ?Diethylpropion HCl CR 75 MG TB24 Take 75 mg by mouth daily. 02/27/21   [provider]  ?EPIPEN 2-PAK 0.3 MG/0.3ML SOAJ injection Inject 0.3 mg into the muscle as  needed for anaphylaxis. 03/10/21   [provider]  ?fluticasone (FLONASE) 50 MCG/ACT nasal spray Place 2 sprays into both nostrils daily for 7 days. ?Patient taking differently: Place 2 sprays into both nostrils daily as needed for allergies. 01/14/21 05/17/21  Long, Wonda Olds, MD  ?hydrOXYzine (VISTARIL) 25 MG capsule Take 25 mg by mouth at bedtime as needed for sleep. 04/03/21   [provider]  ?LARIN 24 FE 1-20 MG-MCG(24) tablet Take 1 tablet by mouth daily. 03/19/21   [provider]  ?levocetirizine (XYZAL) 5 MG tablet Take 5 mg by mouth daily as needed for allergies.    [provider]  ?lidocaine (XYLOCAINE) 5 % ointment Apply 1 application. topically as needed. 05/17/21   Carmin Muskrat, MD  ?ondansetron (ZOFRAN) 4 MG tablet Take 1 tablet (4 mg total) by mouth every 8 (eight) hours as needed for nausea or vomiting. 06/07/19   Tegeler, Gwenyth Allegra, MD  ?oxyCODONE (OXY IR/ROXICODONE) 5 MG immediate release tablet Take 1 tablet (5 mg total) by mouth every 6 (six) hours as needed for severe pain. 05/13/21   Georganna Skeans, MD  ?polyethylene glycol (MIRALAX / GLYCOLAX) 17 g packet Take 17 g by mouth daily. 05/17/21   Carmin Muskrat, MD  ?promethazine (PHENERGAN) 25 MG tablet Take 25 mg by mouth every 6 (six) hours as needed for nausea/vomiting. 03/20/21   [provider]  ?promethazine-dextromethorphan (PROMETHAZINE-DM) 6.25-15 MG/5ML syrup Take 5 mLs by mouth 4 (four) times daily as needed for cough. 05/09/21   Chevis Pretty, FNP  ?senna-docusate (  SENOKOT-S) 8.6-50 MG tablet Take 2 tablets by mouth daily for 10 days. 05/17/21 05/27/21  Carmin Muskrat, MD  ?sertraline (ZOLOFT) 50 MG tablet Take 50 mg by mouth daily as needed for anxiety. 03/06/21   [provider]  ?famotidine (PEPCID) 20 MG tablet Take 1 tablet (20 mg total) by mouth 2 (two) times daily. ?Patient not taking: Reported on 11/02/2017 11/06/16 04/24/19  Elvera Maria, CNM  ?   ? ?Allergies     ?Penicillins and Shrimp [shellfish allergy]   ? ?Review of Systems   ?Review of Systems  ?Constitutional:  Negative for chills and fever.  ?Eyes:  Negative for visual disturbance.  ?Respiratory:  Negative for shortness of breath.   ?Cardiovascular:  Negative for chest pain.  ?Gastrointestinal:  Positive for abdominal pain, anal bleeding, constipation, hematochezia and nausea. Negative for abdominal distention, blood in stool, diarrhea, rectal pain and vomiting.  ?Genitourinary:  Positive for frequency and vaginal bleeding. Negative for decreased urine volume, difficulty urinating, dysuria, genital sores, pelvic pain, urgency, vaginal discharge and vaginal pain.  ?Musculoskeletal:  Negative for back pain and neck pain.  ?Skin:  Negative for color change and rash.  ?Neurological:  Positive for light-headedness. Negative for dizziness, syncope and headaches.  ?Psychiatric/Behavioral:  Negative for confusion.   ? ?Physical Exam ?Updated Vital Signs ?BP 124/73 (BP Location: Right Arm)   Pulse 76   Temp 98.3 ?F (36.8 ?C)   Resp 14   Ht 5\' 5"  (1.651 m)   Wt 89.4 kg   LMP 05/17/2021   SpO2 98%   BMI 32.78 kg/m?  ?Physical Exam ?Vitals and nursing note reviewed. Exam conducted with a chaperone present (Female nurse tech present).  ?Constitutional:   ?   General: She is not in acute distress. ?   Appearance: She is not ill-appearing, toxic-appearing or diaphoretic.  ?HENT:  ?   Head: Normocephalic.  ?Eyes:  ?   General: No scleral icterus.    ?   Right eye: No discharge.     ?   Left eye: No discharge.  ?Cardiovascular:  ?   Rate and Rhythm: Normal rate.  ?Pulmonary:  ?   Effort: Pulmonary effort is normal.  ?Abdominal:  ?   General: Abdomen is flat. A surgical scar is present. Bowel sounds are decreased. There is no distension. There are no signs of injury.  ?   Palpations: Abdomen is soft. There is no mass or pulsatile mass.  ?   Tenderness: There is abdominal tenderness in the right lower quadrant and left lower  quadrant. There is no guarding or rebound.  ?   Hernia: There is no hernia in the left inguinal area or right inguinal area.  ?Genitourinary: ?   General: Normal vulva.  ?   Pubic Area: No rash or pubic lice.   ?   Tanner stage (genital): 5.  ?   Labia:     ?   Right: No rash, tenderness, lesion or injury.     ?   Left: No rash, tenderness, lesion or injury.   ?   Vagina: No signs of injury and foreign body. Bleeding present. No vaginal discharge, erythema, tenderness, lesions or prolapsed vaginal walls.  ?   Cervix: Cervical bleeding present. No cervical motion tenderness, discharge, friability, lesion, erythema or eversion.  ?   Uterus: Tender. Not enlarged and no uterine prolapse.   ?   Adnexa:     ?   Right: No mass, tenderness or fullness.      ?  Left: Tenderness present. No mass or fullness.    ?   Rectum: Tenderness present. No anal fissure or external hemorrhoid.  ?   Comments: No bleeding noted from rectum.  Attempted to perform rectal exam however patient is exquisitely tender.  Minimal amount of yellow-colored discharge noted around patient's rectum.  Blood noted within vaginal vault and coming from cervical os.  No cervical motion tenderness.  Minimal tenderness to uterus and left adnexa. ?Lymphadenopathy:  ?   Lower Body: No right inguinal adenopathy. No left inguinal adenopathy.  ?Skin: ?   General: Skin is warm and dry.  ?Neurological:  ?   General: No focal deficit present.  ?   Mental Status: She is alert.  ?Psychiatric:     ?   Behavior: Behavior is cooperative.  ? ? ? ? ?ED Results / Procedures / Treatments   ?Labs ?(all labs ordered are listed, but only abnormal results are displayed) ?Labs Reviewed  ?COMPREHENSIVE METABOLIC PANEL - Abnormal; Notable for the following components:  ?    Result Value  ? BUN 5 (*)   ? All other components within normal limits  ?CBC - Abnormal; Notable for the following components:  ? Hemoglobin 11.8 (*)   ? All other components within normal limits  ?URINALYSIS,  ROUTINE W REFLEX MICROSCOPIC - Abnormal; Notable for the following components:  ? APPearance HAZY (*)   ? Specific Gravity, Urine 1.004 (*)   ? Hgb urine dipstick LARGE (*)   ? Ketones, ur 20 (*)   ? Leukocytes,Ua

## 2021-05-20 NOTE — ED Notes (Signed)
Pt able to tolerate fluids and food. 

## 2021-05-20 NOTE — ED Notes (Signed)
Pt provided with fluid and drink and educated to take it slow and if she felt sick to let this RN know ?

## 2021-05-20 NOTE — Progress Notes (Signed)
Unfortunately, hemorrhoids are a miserable problem and a miserable surgical recovery.  Two column hemorrhoidectomy 05/13/21.  Has not had real bowel movements yet.  Feels constipated.  Taking 1 miralax packet per day.  Taking colace.  Taking hot baths.   Recommend increasing miralax dosing and/or trying milk of magnesia.  Recommend epson salt in baths.  Recommend following up with Dr. Janee Morn tomorrow as scheduled. ? ?Quentin Ore, MD ?General, Bariatric and Minimally Invasive Surgery ?[facility], [place] ? ?

## 2021-05-20 NOTE — ED Triage Notes (Signed)
Pt arrived POV from home c/o rectal bleeding, pain and not having a bowel movement. Pt states she had hemorrhoids removed a couple weeks ago but has had problems ever since. Pt's LBM was last Tuesday.   ?

## 2021-06-20 ENCOUNTER — Telehealth: Payer: Medicaid Other | Admitting: Physician Assistant

## 2021-06-20 DIAGNOSIS — J019 Acute sinusitis, unspecified: Secondary | ICD-10-CM

## 2021-06-20 DIAGNOSIS — B9689 Other specified bacterial agents as the cause of diseases classified elsewhere: Secondary | ICD-10-CM

## 2021-06-20 MED ORDER — DOXYCYCLINE HYCLATE 100 MG PO TABS: 100.0000 mg | ORAL_TABLET | Freq: Two times a day (BID) | ORAL | 0 refills | Status: DC

## 2021-06-20 NOTE — Progress Notes (Signed)
?Virtual Visit Consent  ? ?Kelsey Black, you are scheduled for a virtual visit with a [facility] provider today. Just as with appointments in the office, your consent must be obtained to participate. Your consent will be active for this visit and any virtual visit you may have with one of our providers in the next 365 days. If you have a MyChart account, a copy of this consent can be sent to you electronically. ? ?As this is a virtual visit, video technology does not allow for your provider to perform a traditional examination. This may limit your provider's ability to fully assess your condition. If your provider identifies any concerns that need to be evaluated in person or the need to arrange testing (such as labs, EKG, etc.), we will make arrangements to do so. Although advances in technology are sophisticated, we cannot ensure that it will always work on either your end or our end. If the connection with a video visit is poor, the visit may have to be switched to a telephone visit. With either a video or telephone visit, we are not always able to ensure that we have a secure connection. ? ?By engaging in this virtual visit, you consent to the provision of healthcare and authorize for your insurance to be billed (if applicable) for the services provided during this visit. Depending on your insurance coverage, you may receive a charge related to this service. ? ?I need to obtain your verbal consent now. Are you willing to proceed with your visit today? Kelsey Black has provided verbal consent on 06/20/2021 for a virtual visit (video or telephone). Margaretann Loveless, PA-C ? ?Date: 06/20/2021 11:06 AM ? ?Virtual Visit via Video Note  ? ?IMargaretann Loveless, connected with  Kelsey Black  ([id number], 07-10-1990) on 06/20/21 at 11:00 AM EDT by a video-enabled telemedicine application and verified that I am speaking with the correct person using two  identifiers. ? ?Location: ?Patient: Virtual Visit Location Patient: Home ?Provider: Virtual Visit Location Provider: Home Office ?  ?I discussed the limitations of evaluation and management by telemedicine and the availability of in person appointments. The patient expressed understanding and agreed to proceed.   ? ?History of Present Illness: ?Kelsey Black is a 31 y.o. who identifies as a female who was assigned female at birth, and is being seen today for URI symptoms. ? ?HPI: URI  ?This is a new problem. The current episode started 1 to 4 weeks ago (about a week ago). The problem has been gradually worsening. Associated symptoms include congestion, headaches, nausea, a plugged ear sensation, sinus pain, a sore throat, swollen glands and vomiting (x once). Pertinent negatives include no coughing, ear pain or rhinorrhea. Associated symptoms comments: fatigue. Treatments tried: nyquil, dayquil, mucinex, theraflu, sudafed. The treatment provided no relief.   ? ? ?Problems:  ?Patient Active Problem List  ? Diagnosis Date Noted  ? Genetic testing 04/22/2021  ? Family history of breast cancer 04/04/2021  ? Family history of Black cancer 04/04/2021  ? Indication for care in labor or delivery 03/22/2017  ? Chronic cholecystitis 11/16/2016  ? Bacterial vaginosis 08/01/2014  ?  ?Allergies:  ?Allergies  ?Allergen Reactions  ? Penicillins Swelling and Other (See Comments)  ?  Reaction:  Arm/leg swelling  ?Has patient had a PCN reaction causing immediate rash, facial/tongue/throat swelling, SOB or lightheadedness with hypotension: Yes ?Has patient had a PCN reaction causing severe rash involving mucus membranes or skin necrosis: No ?Has patient had  a PCN reaction that required hospitalization: No ?Has patient had a PCN reaction occurring within the last 10 years: No ?If all of the above answers are "NO", then may proceed with Cephalosporin use.  ? Shrimp [Shellfish Allergy] Swelling and Other (See Comments)  ?   Localized swelling  ? ?Medications:  ?Current Outpatient Medications:  ?  doxycycline (VIBRA-TABS) 100 MG tablet, Take 1 tablet (100 mg total) by mouth 2 (two) times daily., Disp: 20 tablet, Rfl: 0 ?  acetaminophen (TYLENOL) 500 MG tablet, Take 1,000 mg by mouth every 6 (six) hours as needed for mild pain., Disp: , Rfl:  ?  albuterol (VENTOLIN HFA) 108 (90 Base) MCG/ACT inhaler, Inhale 2 puffs into the lungs every 6 (six) hours as needed for wheezing or shortness of breath., Disp: 8 g, Rfl: 0 ?  Diethylpropion HCl CR 75 MG TB24, Take 75 mg by mouth daily., Disp: , Rfl:  ?  EPIPEN 2-PAK 0.3 MG/0.3ML SOAJ injection, Inject 0.3 mg into the muscle as needed for anaphylaxis., Disp: , Rfl:  ?  fluticasone (FLONASE) 50 MCG/ACT nasal spray, Place 2 sprays into both nostrils daily for 7 days. (Patient taking differently: Place 2 sprays into both nostrils daily as needed for allergies.), Disp: 1 g, Rfl: 0 ?  hydrOXYzine (VISTARIL) 25 MG capsule, Take 25 mg by mouth at bedtime as needed for sleep., Disp: , Rfl:  ?  LARIN 24 FE 1-20 MG-MCG(24) tablet, Take 1 tablet by mouth daily., Disp: , Rfl:  ?  levocetirizine (XYZAL) 5 MG tablet, Take 5 mg by mouth daily as needed for allergies., Disp: , Rfl:  ?  lidocaine (XYLOCAINE) 5 % ointment, Apply 1 application. topically as needed., Disp: 35.44 g, Rfl: 0 ?  ondansetron (ZOFRAN-ODT) 4 MG disintegrating tablet, Take 1 tablet (4 mg total) by mouth every 8 (eight) hours as needed for nausea or vomiting., Disp: 20 tablet, Rfl: 0 ?  oxyCODONE (OXY IR/ROXICODONE) 5 MG immediate release tablet, Take 1 tablet (5 mg total) by mouth every 6 (six) hours as needed for severe pain., Disp: 25 tablet, Rfl: 0 ?  polyethylene glycol (MIRALAX / GLYCOLAX) 17 g packet, Take 17 g by mouth daily., Disp: 14 each, Rfl: 0 ?  promethazine (PHENERGAN) 25 MG tablet, Take 25 mg by mouth every 6 (six) hours as needed for nausea/vomiting., Disp: , Rfl:  ?  promethazine-dextromethorphan (PROMETHAZINE-DM) 6.25-15  MG/5ML syrup, Take 5 mLs by mouth 4 (four) times daily as needed for cough., Disp: 118 mL, Rfl: 0 ?  sertraline (ZOLOFT) 50 MG tablet, Take 50 mg by mouth daily as needed for anxiety., Disp: , Rfl:  ? ?Observations/Objective: ?Patient is well-developed, well-nourished in no acute distress.  ?Resting comfortably at home.  ?Head is normocephalic, atraumatic.  ?No labored breathing.  ?Speech is clear and coherent with logical content.  ?Patient is alert and oriented at baseline.  ? ? ?Assessment and Plan: ?1. Acute bacterial sinusitis ?- doxycycline (VIBRA-TABS) 100 MG tablet; Take 1 tablet (100 mg total) by mouth 2 (two) times daily.  Dispense: 20 tablet; Refill: 0 ? ?- Worsening symptoms that have not responded to OTC medications.  ?- Will give Doxycycline ?- Continue allergy medications.  ?- Steam and humidifier can help ?- Stay well hydrated and get plenty of rest.  ?- Seek in person evaluation if no symptom improvement or if symptoms worsen ? ?Follow Up Instructions: ?I discussed the assessment and treatment plan with the patient. The patient was provided an opportunity to ask questions and  all were answered. The patient agreed with the plan and demonstrated an understanding of the instructions.  A copy of instructions were sent to the patient via MyChart unless otherwise noted below.  ? ? ?The patient was advised to call back or seek an in-person evaluation if the symptoms worsen or if the condition fails to improve as anticipated. ? ?Time:  ?I spent 10 minutes with the patient via telehealth technology discussing the above problems/concerns.   ? ?Margaretann LovelessJennifer M Dona Klemann, PA-C ?

## 2021-06-20 NOTE — Patient Instructions (Signed)
?[name], thank you for joining Margaretann Loveless, PA-C for today's virtual visit.  While this provider is not your primary care provider (PCP), if your PCP is located in our provider database this encounter information will be shared with them immediately following your visit. ? ?Consent: ?(Patient) Kelsey Black provided verbal consent for this virtual visit at the beginning of the encounter. ? ?Current Medications: ? ?Current Outpatient Medications:  ?  doxycycline (VIBRA-TABS) 100 MG tablet, Take 1 tablet (100 mg total) by mouth 2 (two) times daily., Disp: 20 tablet, Rfl: 0 ?  acetaminophen (TYLENOL) 500 MG tablet, Take 1,000 mg by mouth every 6 (six) hours as needed for mild pain., Disp: , Rfl:  ?  albuterol (VENTOLIN HFA) 108 (90 Base) MCG/ACT inhaler, Inhale 2 puffs into the lungs every 6 (six) hours as needed for wheezing or shortness of breath., Disp: 8 g, Rfl: 0 ?  Diethylpropion HCl CR 75 MG TB24, Take 75 mg by mouth daily., Disp: , Rfl:  ?  EPIPEN 2-PAK 0.3 MG/0.3ML SOAJ injection, Inject 0.3 mg into the muscle as needed for anaphylaxis., Disp: , Rfl:  ?  fluticasone (FLONASE) 50 MCG/ACT nasal spray, Place 2 sprays into both nostrils daily for 7 days. (Patient taking differently: Place 2 sprays into both nostrils daily as needed for allergies.), Disp: 1 g, Rfl: 0 ?  hydrOXYzine (VISTARIL) 25 MG capsule, Take 25 mg by mouth at bedtime as needed for sleep., Disp: , Rfl:  ?  LARIN 24 FE 1-20 MG-MCG(24) tablet, Take 1 tablet by mouth daily., Disp: , Rfl:  ?  levocetirizine (XYZAL) 5 MG tablet, Take 5 mg by mouth daily as needed for allergies., Disp: , Rfl:  ?  lidocaine (XYLOCAINE) 5 % ointment, Apply 1 application. topically as needed., Disp: 35.44 g, Rfl: 0 ?  ondansetron (ZOFRAN-ODT) 4 MG disintegrating tablet, Take 1 tablet (4 mg total) by mouth every 8 (eight) hours as needed for nausea or vomiting., Disp: 20 tablet, Rfl: 0 ?  oxyCODONE (OXY IR/ROXICODONE) 5 MG immediate  release tablet, Take 1 tablet (5 mg total) by mouth every 6 (six) hours as needed for severe pain., Disp: 25 tablet, Rfl: 0 ?  polyethylene glycol (MIRALAX / GLYCOLAX) 17 g packet, Take 17 g by mouth daily., Disp: 14 each, Rfl: 0 ?  promethazine (PHENERGAN) 25 MG tablet, Take 25 mg by mouth every 6 (six) hours as needed for nausea/vomiting., Disp: , Rfl:  ?  promethazine-dextromethorphan (PROMETHAZINE-DM) 6.25-15 MG/5ML syrup, Take 5 mLs by mouth 4 (four) times daily as needed for cough., Disp: 118 mL, Rfl: 0 ?  sertraline (ZOLOFT) 50 MG tablet, Take 50 mg by mouth daily as needed for anxiety., Disp: , Rfl:   ? ?Medications ordered in this encounter:  ?Meds ordered this encounter  ?Medications  ? doxycycline (VIBRA-TABS) 100 MG tablet  ?  Sig: Take 1 tablet (100 mg total) by mouth 2 (two) times daily.  ?  Dispense:  20 tablet  ?  Refill:  0  ?  Order Specific Question:   Supervising Provider  ?  Answer:   Eber Hong [[id number]]  ?  ? ?*If you need refills on other medications prior to your next appointment, please contact your pharmacy* ? ?Follow-Up: ?Call back or seek an in-person evaluation if the symptoms worsen or if the condition fails to improve as anticipated. ? ?Other Instructions ?Sinus Infection, Adult ?A sinus infection is soreness and swelling (inflammation) of your sinuses. Sinuses are hollow spaces in  the bones around your face. They are located: ?Around your eyes. ?In the middle of your forehead. ?Behind your nose. ?In your cheekbones. ?Your sinuses and nasal passages are lined with a fluid called mucus. Mucus drains out of your sinuses. Swelling can trap mucus in your sinuses. This lets germs (bacteria, virus, or fungus) grow, which leads to infection. Most of the time, this condition is caused by a virus. ?What are the causes? ?Allergies. ?Asthma. ?Germs. ?Things that block your nose or sinuses. ?Growths in the nose (nasal polyps). ?Chemicals or irritants in the air. ?A fungus. This is rare. ?What  increases the risk? ?Having a weak body defense system (immune system). ?Doing a lot of swimming or diving. ?Using nasal sprays too much. ?Smoking. ?What are the signs or symptoms? ?The main symptoms of this condition are pain and a feeling of pressure around the sinuses. Other symptoms include: ?Stuffy nose (congestion). This may make it hard to breathe through your nose. ?Runny nose (drainage). ?Soreness, swelling, and warmth in the sinuses. ?A cough that may get worse at night. ?Being unable to smell and taste. ?Mucus that collects in the throat or the back of the nose (postnasal drip). This may cause a sore throat or bad breath. ?Being very tired (fatigued). ?A fever. ?How is this diagnosed? ?Your symptoms. ?Your medical history. ?A physical exam. ?Tests to find out if your condition is short-term (acute) or long-term (chronic). Your doctor may: ?Check your nose for growths (polyps). ?Check your sinuses using a tool that has a light on one end (endoscope). ?Check for allergies or germs. ?Do imaging tests, such as an MRI or CT scan. ?How is this treated? ?Treatment for this condition depends on the cause and whether it is short-term or long-term. ?If caused by a virus, your symptoms should go away on their own within 10 days. You may be given medicines to relieve symptoms. They include: ?Medicines that shrink swollen tissue in the nose. ?A spray that treats swelling of the nostrils. ?Rinses that help get rid of thick mucus in your nose (nasal saline washes). ?Medicines that treat allergies (antihistamines). ?Over-the-counter pain relievers. ?If caused by bacteria, your doctor may wait to see if you will get better without treatment. You may be given antibiotic medicine if you have: ?A very bad infection. ?A weak body defense system. ?If caused by growths in the nose, surgery may be needed. ?Follow these instructions at home: ?Medicines ?Take, use, or apply over-the-counter and prescription medicines only as told  by your doctor. These may include nasal sprays. ?If you were prescribed an antibiotic medicine, take it as told by your doctor. Do not stop taking it even if you start to feel better. ?Hydrate and humidify ? ?Drink enough water to keep your pee (urine) pale yellow. ?Use a cool mist humidifier to keep the humidity level in your home above 50%. ?Breathe in steam for 10-15 minutes, 3-4 times a day, or as told by your doctor. You can do this in the bathroom while a hot shower is running. ?Try not to spend time in cool or dry air. ?Rest ?Rest as much as you can. ?Sleep with your head raised (elevated). ?Make sure you get enough sleep each night. ?General instructions ? ?Put a warm, moist washcloth on your face 3-4 times a day, or as often as told by your doctor. ?Use nasal saline washes as often as told by your doctor. ?Wash your hands often with soap and water. If you cannot use soap and  water, use hand sanitizer. ?Do not smoke. Avoid being around people who are smoking (secondhand smoke). ?Keep all follow-up visits. ?Contact a doctor if: ?You have a fever. ?Your symptoms get worse. ?Your symptoms do not get better within 10 days. ?Get help right away if: ?You have a very bad headache. ?You cannot stop vomiting. ?You have very bad pain or swelling around your face or eyes. ?You have trouble seeing. ?You feel confused. ?Your neck is stiff. ?You have trouble breathing. ?These symptoms may be an emergency. Get help right away. Call 911. ?Do not wait to see if the symptoms will go away. ?Do not drive yourself to the hospital. ?Summary ?A sinus infection is swelling of your sinuses. Sinuses are hollow spaces in the bones around your face. ?This condition is caused by tissues in your nose that become inflamed or swollen. This traps germs. These can lead to infection. ?If you were prescribed an antibiotic medicine, take it as told by your doctor. Do not stop taking it even if you start to feel better. ?Keep all follow-up  visits. ?This information is not intended to replace advice given to you by your health care provider. Make sure you discuss any questions you have with your health care provider. ?Document Revised: 01/07/2021 D

## 2021-07-26 ENCOUNTER — Emergency Department (HOSPITAL_COMMUNITY)
Admission: EM | Admit: 2021-07-26 | Discharge: 2021-07-26 | Disposition: A | Discharge status: Home / Self Care | Payer: Medicaid Other | Attending: Emergency Medicine | Admitting: Emergency Medicine

## 2021-07-26 ENCOUNTER — Other Ambulatory Visit: Payer: Self-pay

## 2021-07-26 ENCOUNTER — Emergency Department (HOSPITAL_COMMUNITY): Payer: Medicaid Other

## 2021-07-26 ENCOUNTER — Encounter (HOSPITAL_COMMUNITY): Payer: Self-pay

## 2021-07-26 DIAGNOSIS — K5900 Constipation, unspecified: Secondary | ICD-10-CM | POA: Insufficient documentation

## 2021-07-26 DIAGNOSIS — K644 Residual hemorrhoidal skin tags: Secondary | ICD-10-CM | POA: Insufficient documentation

## 2021-07-26 DIAGNOSIS — K6289 Other specified diseases of anus and rectum: Secondary | ICD-10-CM | POA: Diagnosis present

## 2021-07-26 LAB — URINALYSIS, ROUTINE W REFLEX MICROSCOPIC
Bilirubin Urine: NEGATIVE
Glucose, UA: NEGATIVE mg/dL
Hgb urine dipstick: NEGATIVE
Ketones, ur: NEGATIVE mg/dL
Leukocytes,Ua: NEGATIVE
Nitrite: NEGATIVE
Protein, ur: NEGATIVE mg/dL
Specific Gravity, Urine: 1.012 (ref 1.005–1.030)
pH: 8 (ref 5.0–8.0)

## 2021-07-26 LAB — CBC WITH DIFFERENTIAL/PLATELET
Abs Immature Granulocytes: 0.02 10*3/uL (ref 0.00–0.07)
Basophils Absolute: 0 10*3/uL (ref 0.0–0.1)
Basophils Relative: 1 %
Eosinophils Absolute: 0.1 10*3/uL (ref 0.0–0.5)
Eosinophils Relative: 1 %
HCT: 36.3 % (ref 36.0–46.0)
Hemoglobin: 11.2 g/dL — ABNORMAL LOW (ref 12.0–15.0)
Immature Granulocytes: 0 %
Lymphocytes Relative: 30 %
Lymphs Abs: 2.4 10*3/uL (ref 0.7–4.0)
MCH: 26.5 pg (ref 26.0–34.0)
MCHC: 30.9 g/dL (ref 30.0–36.0)
MCV: 85.8 fL (ref 80.0–100.0)
Monocytes Absolute: 0.5 10*3/uL (ref 0.1–1.0)
Monocytes Relative: 6 %
Neutro Abs: 4.8 10*3/uL (ref 1.7–7.7)
Neutrophils Relative %: 62 %
Platelets: 343 10*3/uL (ref 150–400)
RBC: 4.23 MIL/uL (ref 3.87–5.11)
RDW: 15.8 % — ABNORMAL HIGH (ref 11.5–15.5)
WBC: 7.8 10*3/uL (ref 4.0–10.5)
nRBC: 0 % (ref 0.0–0.2)

## 2021-07-26 LAB — COMPREHENSIVE METABOLIC PANEL
ALT: 13 U/L (ref 0–44)
AST: 14 U/L — ABNORMAL LOW (ref 15–41)
Albumin: 3.5 g/dL (ref 3.5–5.0)
Alkaline Phosphatase: 61 U/L (ref 38–126)
Anion gap: 8 (ref 5–15)
BUN: 6 mg/dL (ref 6–20)
CO2: 24 mmol/L (ref 22–32)
Calcium: 8.6 mg/dL — ABNORMAL LOW (ref 8.9–10.3)
Chloride: 103 mmol/L (ref 98–111)
Creatinine, Ser: 0.6 mg/dL (ref 0.44–1.00)
GFR, Estimated: >60 mL/min (ref >60)
Glucose, Bld: 91 mg/dL (ref 70–99)
Potassium: 4.2 mmol/L (ref 3.5–5.1)
Sodium: 135 mmol/L (ref 135–145)
Total Bilirubin: 0.5 mg/dL (ref 0.3–1.2)
Total Protein: 6.6 g/dL (ref 6.5–8.1)

## 2021-07-26 LAB — MAGNESIUM: Magnesium: 2.2 mg/dL (ref 1.7–2.4)

## 2021-07-26 LAB — LIPASE, BLOOD: Lipase: 23 U/L (ref 11–51)

## 2021-07-26 LAB — HCG, SERUM, QUALITATIVE: Preg, Serum: NEGATIVE

## 2021-07-26 MED ORDER — DOCUSATE SODIUM 100 MG PO CAPS: 100.0000 mg | ORAL_CAPSULE | Freq: Two times a day (BID) | ORAL | 0 refills | Status: DC

## 2021-07-26 MED ORDER — ONDANSETRON 4 MG PO TBDP: 4.0000 mg | ORAL_TABLET | Freq: Four times a day (QID) | ORAL | 0 refills | Status: DC | PRN

## 2021-07-26 MED ORDER — IOHEXOL 300 MG/ML  SOLN
100.0000 mL | Freq: Once | INTRAMUSCULAR | Status: AC | PRN
  Administered 2021-07-26: 100 mL via INTRAVENOUS

## 2021-07-26 MED ORDER — KETOROLAC TROMETHAMINE 15 MG/ML IJ SOLN
15.0000 mg | Freq: Once | INTRAMUSCULAR | Status: AC
  Administered 2021-07-26: 15 mg via INTRAVENOUS
  Filled 2021-07-26: qty 1

## 2021-07-26 MED ORDER — ONDANSETRON HCL 4 MG/2ML IJ SOLN
4.0000 mg | Freq: Once | INTRAMUSCULAR | Status: AC
  Administered 2021-07-26: 4 mg via INTRAVENOUS
  Filled 2021-07-26: qty 2

## 2021-07-26 MED ORDER — SODIUM CHLORIDE 0.9 % IV BOLUS
1000.0000 mL | Freq: Once | INTRAVENOUS | Status: AC
  Administered 2021-07-26: 1000 mL via INTRAVENOUS

## 2021-07-26 NOTE — ED Provider Notes (Signed)
[facility]   CSN: [id number] Arrival date & time: 07/26/21  1237     History  No chief complaint on file.   Kelsey Black is a 31 y.o. female.  Kelsey Black is a 31 y.o. female with a history of nausea and vomiting, constipation and recent hemorrhoidectomy, who presents to the emergency department for rectal pain, abdominal pain and constipation.  Patient had 2 external and 2 internal hemorrhoids removed by Dr. Janee Mornhompson with general surgery on 3/28.  Since then she has had ongoing issues with rectal pain and constipation.  For a while she was on oxycodone for pain management but she felt that this was worsening her constipation and wanted to get off of this medication.  She has been using MiraLAX and milk of magnesia reports despite this she started to have worsening issues with constipation.  She reports today she was straining to try and have a bowel movement and was only able to pass some blood which has been an ongoing issue since her surgery.  She has not been able to pass any solid stool in several days.  She reports she was straining so hard today and tried to use the bathroom and stood up quickly and passed out in her child's father found her passed out on the bathroom floor shortly after.  She quickly came to and denies any headache or visual changes.  No dizziness or continued lightheadedness.  She has had similar syncopal episodes related to straining before.  Patient also reports some ongoing nausea and decreased appetite and reports she has not been eating much or drinking fluids.  She had a follow-up appointment with a PA in the general surgery office who recommended that she continue with MiraLAX and milk of magnesia for constipation, she has a follow-up appointment with her surgeon in 1 week.  Reports that ever since surgery she has continued having issues with constipation and having to strain even after  stopping narcotic pain medication.  She reports she started to have a lot more rectal pain, previously tried lidocaine gel without much relief.  The history is provided by the patient and medical records.       Home Medications Prior to Admission medications   Medication Sig Start Date End Date Taking? Authorizing Provider  docusate sodium (COLACE) 100 MG capsule Take 1 capsule (100 mg total) by mouth every 12 (twelve) hours. 07/26/21  Yes Dartha LodgeFord, Zoltan Genest N, PA-C  ondansetron (ZOFRAN-ODT) 4 MG disintegrating tablet Take 1 tablet (4 mg total) by mouth every 6 (six) hours as needed for nausea or vomiting. 07/26/21  Yes Dartha LodgeFord, Authur Cubit N, PA-C  acetaminophen (TYLENOL) 500 MG tablet Take 1,000 mg by mouth every 6 (six) hours as needed for mild pain.    [provider]  albuterol (VENTOLIN HFA) 108 (90 Base) MCG/ACT inhaler Inhale 2 puffs into the lungs every 6 (six) hours as needed for wheezing or shortness of breath. 05/09/21   Daphine DeutscherMartin, Mary-Margaret, FNP  Diethylpropion HCl CR 75 MG TB24 Take 75 mg by mouth daily. 02/27/21   [provider]  doxycycline (VIBRA-TABS) 100 MG tablet Take 1 tablet (100 mg total) by mouth 2 (two) times daily. 06/20/21   Margaretann LovelessBurnette, Jennifer M, PA-C  EPIPEN 2-PAK 0.3 MG/0.3ML SOAJ injection Inject 0.3 mg into the muscle as needed for anaphylaxis. 03/10/21   [provider]  fluticasone (FLONASE) 50 MCG/ACT nasal spray Place 2 sprays into both nostrils daily for 7 days.  Patient taking differently: Place 2 sprays into both nostrils daily as needed for allergies. 01/14/21 05/17/21  Long, Arlyss Repress, MD  hydrOXYzine (VISTARIL) 25 MG capsule Take 25 mg by mouth at bedtime as needed for sleep. 04/03/21   [provider]  LARIN 24 FE 1-20 MG-MCG(24) tablet Take 1 tablet by mouth daily. 03/19/21   [provider]  levocetirizine (XYZAL) 5 MG tablet Take 5 mg by mouth daily as needed for allergies.    [provider]  lidocaine (XYLOCAINE) 5 %  ointment Apply 1 application. topically as needed. 05/17/21   Gerhard Munch, MD  oxyCODONE (OXY IR/ROXICODONE) 5 MG immediate release tablet Take 1 tablet (5 mg total) by mouth every 6 (six) hours as needed for severe pain. 05/13/21   Violeta Gelinas, MD  polyethylene glycol (MIRALAX / GLYCOLAX) 17 g packet Take 17 g by mouth daily. 05/17/21   Gerhard Munch, MD  promethazine (PHENERGAN) 25 MG tablet Take 25 mg by mouth every 6 (six) hours as needed for nausea/vomiting. 03/20/21   [provider]  promethazine-dextromethorphan (PROMETHAZINE-DM) 6.25-15 MG/5ML syrup Take 5 mLs by mouth 4 (four) times daily as needed for cough. 05/09/21   Daphine Deutscher, Mary-Margaret, FNP  sertraline (ZOLOFT) 50 MG tablet Take 50 mg by mouth daily as needed for anxiety. 03/06/21   [provider]  famotidine (PEPCID) 20 MG tablet Take 1 tablet (20 mg total) by mouth 2 (two) times daily. Patient not taking: Reported on 11/02/2017 11/06/16 04/24/19  Hurshel Party, CNM      Allergies    Penicillins and Shrimp [shellfish allergy]    Review of Systems   Review of Systems  Constitutional:  Negative for chills and fever.  Gastrointestinal:  Positive for abdominal pain, blood in stool, constipation, nausea and rectal pain. Negative for vomiting.  Genitourinary:  Negative for dysuria, frequency, vaginal bleeding and vaginal discharge.    Physical Exam Updated Vital Signs BP 114/79   Pulse 66   Temp 98.8 F (37.1 C)   Resp 16   SpO2 100%  Physical Exam Vitals and nursing note reviewed. Exam conducted with a chaperone present.  Constitutional:      General: She is not in acute distress.    Appearance: Normal appearance. She is well-developed. She is not ill-appearing or diaphoretic.  HENT:     Head: Normocephalic and atraumatic.     Mouth/Throat:     Mouth: Mucous membranes are moist.     Pharynx: Oropharynx is clear.  Eyes:     General:        Right eye: No discharge.        Left eye: No  discharge.  Cardiovascular:     Rate and Rhythm: Normal rate and regular rhythm.     Pulses: Normal pulses.     Heart sounds: Normal heart sounds.  Pulmonary:     Effort: Pulmonary effort is normal. No respiratory distress.     Breath sounds: Normal breath sounds. No wheezing or rales.     Comments: Respirations equal and unlabored, patient able to speak in full sentences, lungs clear to auscultation bilaterally  Abdominal:     General: Bowel sounds are normal. There is no distension.     Palpations: Abdomen is soft. There is no mass.     Tenderness: There is abdominal tenderness. There is no guarding.     Comments: Abdomen soft, nondistended, bowel sounds present throughout, there is some very mild generalized abdominal tenderness, no guarding or peritoneal signs.  Genitourinary:    Comments: Chaperone present during rectal exam. Patient appears to have 1 small nonthrombosed external hemorrhoid.  On attempted digital rectal exam patient had significant pain and could not tolerate exam, no bright red blood noted. Musculoskeletal:        General: No deformity.     Cervical back: Neck supple.  Skin:    General: Skin is warm and dry.     Capillary Refill: Capillary refill takes less than 2 seconds.  Neurological:     Mental Status: She is alert and oriented to person, place, and time.     Coordination: Coordination normal.     Comments: Speech is clear, able to follow commands Moves extremities without ataxia, coordination intact  Psychiatric:        Mood and Affect: Mood normal.        Behavior: Behavior normal.     ED Results / Procedures / Treatments   Labs (all labs ordered are listed, but only abnormal results are displayed) Labs Reviewed  CBC WITH DIFFERENTIAL/PLATELET - Abnormal; Notable for the following components:      Result Value   Hemoglobin 11.2 (*)    RDW 15.8 (*)    All other components within normal limits  COMPREHENSIVE METABOLIC PANEL - Abnormal; Notable for  the following components:   Calcium 8.6 (*)    AST 14 (*)    All other components within normal limits  LIPASE, BLOOD  URINALYSIS, ROUTINE W REFLEX MICROSCOPIC  HCG, SERUM, QUALITATIVE  MAGNESIUM    EKG None  Radiology CT ABDOMEN PELVIS W CONTRAST  Result Date: 07/26/2021 CLINICAL DATA:  Constipation for 3 days.  Nausea and abdominal pain EXAM: CT ABDOMEN AND PELVIS WITH CONTRAST TECHNIQUE: Multidetector CT imaging of the abdomen and pelvis was performed using the standard protocol following bolus administration of intravenous contrast. RADIATION DOSE REDUCTION: This exam was performed according to the departmental dose-optimization program which includes automated exposure control, adjustment of the mA and/or kV according to patient size and/or use of iterative reconstruction technique. CONTRAST:  OMNIPAQUE IOHEXOL 300 MG/ML  SOLN COMPARISON:  None Available. FINDINGS: Lower chest: Lung bases are clear. Hepatobiliary: No focal hepatic lesion. Postcholecystectomy. No biliary dilatation. Pancreas: Pancreas is normal. No ductal dilatation. No pancreatic inflammation. Spleen: Normal spleen Adrenals/urinary tract: Adrenal glands and kidneys are normal. The ureters and bladder normal. Stomach/Bowel: Stomach, small bowel, appendix, and cecum are normal. Ascending Black normal. Moderate volume stool in the transverse and descending Black. Moderate volume stool in the sigmoid Black. Minimal stool in the rectum. Vascular/Lymphatic: Abdominal aorta is normal caliber. There is no retroperitoneal or periportal lymphadenopathy. No pelvic lymphadenopathy. Reproductive: Uterus and adnexa unremarkable. Other: No free fluid. Musculoskeletal: No aggressive osseous lesion. IMPRESSION: 1. No bowel obstruction or inflammation. 2. Moderate volume stool throughout the Black. 3. Normal appendix. 4. Post appendectomy. Electronically Signed   By: Genevive Bi M.D.   On: 07/26/2021 19:32    Procedures Procedures     Medications Ordered in ED Medications  sodium chloride 0.9 % bolus 1,000 mL (0 mLs Intravenous Stopped 07/26/21 2003)  ondansetron (ZOFRAN) injection 4 mg (4 mg Intravenous Given 07/26/21 1859)  ketorolac (TORADOL) 15 MG/ML injection 15 mg (15 mg Intravenous Given 07/26/21 1900)  iohexol (OMNIPAQUE) 300 MG/ML solution 100 mL (100 mLs Intravenous Contrast Given 07/26/21 1925)    ED Course/ Medical Decision Making/ A&P  Medical Decision Making Amount and/or Complexity of Data Reviewed Radiology: ordered.  Risk OTC drugs. Prescription drug management.   31 y.o. female presents to the ED with complaints of constipation, abdominal pain, rectal pain and syncopal episode , this involves an extensive number of treatment options, and is a complaint that carries with it a high risk of complications and morbidity.  The differential diagnosis includes vasovagal syncope after straining to have bowel movement, recurrent hemorrhoids, medication induced constipation, bowel obstruction, fecal impaction   On arrival pt is nontoxic, vitals WNL. Exam significant for mild generalized abdominal tenderness, possible small external hemorrhoid, unable to tolerate internal rectal exam due to pain  Additional history obtained from chart review. Previous records obtained and reviewed.  Patient was seen in general surgery office most recently 5 days ago, and has been seen in the ED for similar issues with pain and constipation ever since her hemorrhoidectomy on 3/28, reviewed notes and prior labs and imaging   Lab Tests:  I Ordered, reviewed, and interpreted labs, which included: No leukocytosis, hemoglobin stable despite reported bleeding when straining to have a bowel movement, no significant electrolyte derangements, normal renal and liver function, normal lipase, negative pregnancy, normal magnesium  EKG: Normal sinus rhythm without ischemic changes, unchanged compared to  prior  Imaging Studies ordered:  I ordered imaging studies which included CT abdomen pelvis, I independently visualized and interpreted imaging which showed no bowel obstruction or inflammation.  Moderate volume stool throughout the Black.  ED Course:   Patient presents with ongoing constipation as well as worsening rectal pain and some abdominal pain.  Had a syncopal episode after straining to have a bowel movement which she has prior history of, no concern for other more severe cause for syncope.  Suspect rectal pain is leading to worsening constipation patient would likely benefit from a more robust bowel regimen.  She is currently not really eating any fiber or drinking much fluid and is taking MiraLAX daily and intermittently using milk of magnesia.  Symptoms treated with IV fluids, Toradol and Zofran.  Explained to patient that I would like to avoid opioids as this will likely only worsen constipation and she expresses understanding and agreement.  Pain improved.  Her lab work and imaging today is overall reassuring.  Patient has upcoming follow-up next week with her surgeon.  Recommend that she begin using MiraLAX and Colace twice daily and milk of magnesia as needed also recommending increasing fiber and water intake.  I have recommended that the patient follow-up with GI to help with further management of ongoing constipation.  At this time there does not appear to be any evidence of an acute emergency medical condition requiring further emergent evaluation and the patient appears stable for discharge with appropriate outpatient follow up. Diagnosis and return precautions discussed with patient who verbalizes understanding and is agreeable to discharge.     Portions of this note were generated with Scientist, clinical (histocompatibility and immunogenetics). Dictation errors may occur despite best attempts at proofreading.         Final Clinical Impression(s) / ED Diagnoses Final diagnoses:  Constipation,  unspecified constipation type  Rectal pain    Rx / DC Orders ED Discharge Orders          Ordered    docusate sodium (COLACE) 100 MG capsule  Every 12 hours        07/26/21 2004    ondansetron (ZOFRAN-ODT) 4 MG disintegrating tablet  Every 6 hours PRN  07/26/21 2004              Dartha Lodge, PA-C 07/26/21 2318    Cheryll Cockayne, MD 08/04/21 [phone]

## 2021-07-26 NOTE — Discharge Instructions (Addendum)
Your CT scan shows a moderate amount of constipation but no stool impaction or blockage.  To help keep your bowels moving please use MiraLAX and Colace twice daily.  You can use milk of magnesia as needed if you are still not having bowel movements.  You can use ibuprofen and Tylenol as needed for pain you can also apply heat to the abdomen as needed for pain or cramping.  Use prescribed Zofran as needed for nausea.  Please make sure you are drinking plenty of water and try and increase the amount of fiber in your diet.  On the external rectal exam today looks like you may have a small new hemorrhoid versus an area of healing of healing from prior hemorrhoid removal.  If you continue to have to strain to have bowel movements this can cause more hemorrhoids to form.  Please follow-up with your surgeon and you can also call to schedule follow-up appointment with GI to help with further management of your constipation.

## 2021-07-26 NOTE — ED Provider Triage Note (Signed)
Emergency Medicine Provider Triage Evaluation Note  Kelsey Black , a 31 y.o. female  was evaluated in triage.  Pt complains of passing out while having a bowel movement.  She reports she was trying to strain as she has not defecated in the past 2 days.  It was unknown how long she was down for, she was found by her "baby daddy ".  She did lose consciousness but did not strike her head.  She noted a lot of blood on the toilet bowl.  Prior history of hemorrhoid removal external and internal.  Feels that "my poop is too hard to come".  Also endorsing nausea along with decrease in p.o. intake.  Review of Systems  Positive: Abdominal pain, nausea, syncope Negative: Chest pain, shortness of breath  Physical Exam  BP 112/66   Pulse 77   Temp 98.6 F (37 C) (Oral)   Resp 14   SpO2 100%  Gen:   Awake, no distress   Resp:  Normal effort  MSK:   Moves extremities without difficulty  Other:  Abdomen is tender to palpation on the lower abdomen.  Bowel sounds are present throughout.  Medical Decision Making  Medically screening exam initiated at 12:50 PM.  Appropriate orders placed.  Mili Piltz Black was informed that the remainder of the evaluation will be completed by another provider, this initial triage assessment does not replace that evaluation, and the importance of remaining in the ED until their evaluation is complete.  Syncope with lower abdominal pain   Claude Manges, PA-C 07/26/21 1255

## 2021-07-26 NOTE — ED Notes (Signed)
RN reviewed discharge instructions with pt. Pt verbalized understanding and had no further questions. VSS upon discharge.  

## 2021-07-26 NOTE — ED Triage Notes (Signed)
Patient complains of syncopal event this am while attempting to have bowel movement. Patient reports constipation x 3 days and now has abdominal pain and nausea

## 2021-07-31 ENCOUNTER — Encounter: Payer: Self-pay | Admitting: Gastroenterology

## 2021-08-13 ENCOUNTER — Ambulatory Visit: Payer: Medicaid Other | Admitting: Physician Assistant

## 2021-08-20 ENCOUNTER — Ambulatory Visit: Payer: Medicaid Other | Admitting: Physician Assistant

## 2021-08-27 ENCOUNTER — Ambulatory Visit: Payer: Medicaid Other | Admitting: Gastroenterology

## 2021-09-02 ENCOUNTER — Encounter: Payer: Self-pay | Admitting: Physician Assistant

## 2021-09-02 ENCOUNTER — Ambulatory Visit: Payer: Medicaid Other | Admitting: Physician Assistant

## 2021-09-02 VITALS — BP 113/73 | HR 77 | Temp 98.3°F | Ht 65.0 in | Wt 193.2 lb

## 2021-09-02 DIAGNOSIS — N809 Endometriosis, unspecified: Secondary | ICD-10-CM

## 2021-09-02 DIAGNOSIS — F419 Anxiety disorder, unspecified: Secondary | ICD-10-CM

## 2021-09-02 DIAGNOSIS — E559 Vitamin D deficiency, unspecified: Secondary | ICD-10-CM | POA: Diagnosis not present

## 2021-09-02 DIAGNOSIS — F32A Depression, unspecified: Secondary | ICD-10-CM

## 2021-09-02 DIAGNOSIS — D508 Other iron deficiency anemias: Secondary | ICD-10-CM | POA: Diagnosis not present

## 2021-09-02 DIAGNOSIS — R638 Other symptoms and signs concerning food and fluid intake: Secondary | ICD-10-CM | POA: Diagnosis not present

## 2021-09-02 LAB — COMPREHENSIVE METABOLIC PANEL
ALT: 11 U/L (ref 0–35)
AST: 16 U/L (ref 0–37)
Albumin: 4 g/dL (ref 3.5–5.2)
Alkaline Phosphatase: 61 U/L (ref 39–117)
BUN: 9 mg/dL (ref 6–23)
CO2: 25 mEq/L (ref 19–32)
Calcium: 9 mg/dL (ref 8.4–10.5)
Chloride: 104 mEq/L (ref 96–112)
Creatinine, Ser: 0.54 mg/dL (ref 0.40–1.20)
GFR: 123.43 mL/min (ref >60.00)
Glucose, Bld: 79 mg/dL (ref 70–99)
Potassium: 4.1 mEq/L (ref 3.5–5.1)
Sodium: 137 mEq/L (ref 135–145)
Total Bilirubin: 0.5 mg/dL (ref 0.2–1.2)
Total Protein: 7.4 g/dL (ref 6.0–8.3)

## 2021-09-02 LAB — CBC WITH DIFFERENTIAL/PLATELET
Basophils Absolute: 0 10*3/uL (ref 0.0–0.1)
Basophils Relative: 0.4 % (ref 0.0–3.0)
Eosinophils Absolute: 0.1 10*3/uL (ref 0.0–0.7)
Eosinophils Relative: 1.7 % (ref 0.0–5.0)
HCT: 35.6 % — ABNORMAL LOW (ref 36.0–46.0)
Hemoglobin: 11.2 g/dL — ABNORMAL LOW (ref 12.0–15.0)
Lymphocytes Relative: 33.2 % (ref 12.0–46.0)
Lymphs Abs: 2.5 10*3/uL (ref 0.7–4.0)
MCHC: 31.4 g/dL (ref 30.0–36.0)
MCV: 82.2 fl (ref 78.0–100.0)
Monocytes Absolute: 0.5 10*3/uL (ref 0.1–1.0)
Monocytes Relative: 6.3 % (ref 3.0–12.0)
Neutro Abs: 4.4 10*3/uL (ref 1.4–7.7)
Neutrophils Relative %: 58.4 % (ref 43.0–77.0)
Platelets: 370 10*3/uL (ref 150.0–400.0)
RBC: 4.33 Mil/uL (ref 3.87–5.11)
RDW: 16.4 % — ABNORMAL HIGH (ref 11.5–15.5)
WBC: 7.6 10*3/uL (ref 4.0–10.5)

## 2021-09-02 LAB — VITAMIN D 25 HYDROXY (VIT D DEFICIENCY, FRACTURES): VITD: 18.68 ng/mL — ABNORMAL LOW (ref 30.00–100.00)

## 2021-09-02 MED ORDER — HYDROXYZINE PAMOATE 25 MG PO CAPS: 25.0000 mg | ORAL_CAPSULE | Freq: Every evening | ORAL | 5 refills | Status: DC | PRN

## 2021-09-02 MED ORDER — SERTRALINE HCL 50 MG PO TABS: 50.0000 mg | ORAL_TABLET | Freq: Every day | ORAL | 5 refills | Status: DC | PRN

## 2021-09-02 MED ORDER — LARIN 24 FE 1-20 MG-MCG(24) PO TABS: 1.0000 | ORAL_TABLET | Freq: Every day | ORAL | 5 refills | Status: DC

## 2021-09-02 NOTE — Patient Instructions (Addendum)
Welcome to [facility]! It was a pleasure meeting you today.  Labs today, will contact you with results Medications refilled I will get back with you about the Diethylpropion; I need to review your charts more.   As discussed, Please schedule a 6 month follow up visit today.  PLEASE NOTE:  If you had any LAB tests please let us know if you have not heard back within a few days. You may see your results on MyChart before we have a chance to review them but we will give you a call once they are reviewed by [place]. If we ordered any REFERRALS today, please let us know if you have not heard from their office within the next two weeks. Let us know through MyChart if you are needing REFILLS, or have your pharmacy send [place] the request. You can also use MyChart to communicate with me or any office staff.  Please try these tips to maintain a healthy lifestyle:  Eat most of your calories during the day when you are active. Eliminate processed foods including packaged sweets (pies, cakes, cookies), reduce intake of potatoes, white bread, white pasta, and white rice. Look for whole grain options, oat flour or almond flour.  Each meal should contain half fruits/vegetables, one quarter protein, and one quarter carbs (no bigger than a computer mouse).  Cut down on sweet beverages. This includes juice, soda, and sweet tea. Also watch fruit intake, though this is a healthier sweet option, it still contains natural sugar! Limit to 3 servings daily.  Drink at least 1 glass of water with each meal and aim for at least 8 glasses (64 ounces) per day.  Exercise at least 150 minutes every week to the best of your ability.    Take Care,  Milisa Kimbell, PA-C

## 2021-09-02 NOTE — Progress Notes (Signed)
Subjective:    Patient ID: Kelsey Black, female    DOB: 1990/09/26, 31 y.o.   MRN: [id number]  Chief Complaint  Patient presents with   New Patient (Initial Visit)    Previously seen at [facility]            HPI 31 y.o. patient presents today for new patient establishment with me.  Patient was previously established with [facility]. Has a daughter - 8 years old, seizures / autistic   Current Care Team: GYN - Nestor Ramp Ophthalmologist - keratoconus   GI - family hx (grandfather) Black cancer; pt herself has hx polyp   Acute Concerns: Will need me to take over management of Zoloft, Diethylpropion, Hydroxyzine; also requests refill of her OCP   Past Medical History:  Diagnosis Date   Anemia    Chorioamnionitis 2019   Depression    Endometriosis    Family history of adverse reaction to anesthesia    mother woke up during abdominal surgery 3 years ago   Family history of Black cancer 04/04/2021   Nausea & vomiting    last month   Pregnancy    19 weeks and few days michelle horvath ob dr   Symptomatic cholelithiasis     Past Surgical History:  Procedure Laterality Date   CHOLECYSTECTOMY N/A 11/16/2016   Procedure: LAPAROSCOPIC CHOLECYSTECTOMY;  Surgeon: Andria Meuse, MD;  Location: [facility];  Service: General;  Laterality: N/A;   COLONOSCOPY W/ POLYPECTOMY  2023   HEMORRHOID SURGERY N/A 05/13/2021   Procedure: INTERNAL AND EXTERNAL HEMORRHOIDECTOMY;  Surgeon: Violeta Gelinas, MD;  Location: [facility] OR;  Service: General;  Laterality: N/A;   TOOTH EXTRACTION      Family History  Problem Relation Age of Onset   Asthma Mother    Asthma Father    Epilepsy Father    Diabetes Father    Asthma Sister    Asthma Brother    Breast cancer Maternal Aunt 23   Diabetes Maternal Grandfather    Hypertension Maternal Grandfather    Black cancer Maternal Grandfather 57    Social History   Tobacco Use   Smoking status: Never   Smokeless tobacco:  Never  Vaping Use   Vaping Use: Some days  Substance Use Topics   Alcohol use: Yes    Comment: social   Drug use: No     Allergies  Allergen Reactions   Penicillins Swelling and Other (See Comments)    Reaction:  Arm/leg swelling  Has patient had a PCN reaction causing immediate rash, facial/tongue/throat swelling, SOB or lightheadedness with hypotension: Yes Has patient had a PCN reaction causing severe rash involving mucus membranes or skin necrosis: No Has patient had a PCN reaction that required hospitalization: No Has patient had a PCN reaction occurring within the last 10 years: No If all of the above answers are "NO", then may proceed with Cephalosporin use.   Shrimp [Shellfish Allergy] Swelling and Other (See Comments)    Localized swelling    Review of Systems NEGATIVE UNLESS OTHERWISE INDICATED IN HPI      Objective:     BP 113/73   Pulse 77   Temp 98.3 F (36.8 C) (Temporal)   Ht 5\' 5"  (1.651 m)   Wt 193 lb 3.2 oz (87.6 kg)   LMP 08/08/2021 (Exact Date)   SpO2 100%   BMI 32.15 kg/m   Wt Readings from Last 3 Encounters:  09/02/21 193 lb 3.2 oz (87.6 kg)  05/20/21  197 lb (89.4 kg)  05/14/21 195 lb (88.5 kg)    BP Readings from Last 3 Encounters:  09/02/21 113/73  07/26/21 114/79  05/20/21 106/70     Physical Exam Vitals and nursing note reviewed.  Constitutional:      Appearance: Normal appearance. She is obese. She is not toxic-appearing.  HENT:     Head: Normocephalic and atraumatic.  Eyes:     Extraocular Movements: Extraocular movements intact.     Conjunctiva/sclera: Conjunctivae normal.     Pupils: Pupils are equal, round, and reactive to light.  Cardiovascular:     Rate and Rhythm: Normal rate and regular rhythm.     Pulses: Normal pulses.     Heart sounds: Normal heart sounds.  Pulmonary:     Effort: Pulmonary effort is normal.     Breath sounds: Normal breath sounds.  Musculoskeletal:        General: Normal range of motion.      Cervical back: Normal range of motion and neck supple.  Skin:    General: Skin is warm and dry.  Neurological:     General: No focal deficit present.     Mental Status: She is alert and oriented to person, place, and time.  Psychiatric:        Mood and Affect: Mood normal.        Behavior: Behavior normal.        Thought Content: Thought content normal.        Judgment: Judgment normal.       Assessment & Plan:   Problem List Items Addressed This Visit   None Visit Diagnoses     Endometriosis    -  Primary   Difficulty maintaining weight       Relevant Orders   Comprehensive metabolic panel (Completed)   Other iron deficiency anemia       Relevant Orders   CBC with Differential/Platelet (Completed)   Iron, TIBC and Ferritin Panel (Completed)   Hypocalcemia       Relevant Orders   PTH, Intact (ICMA) and Ionized Calcium (Completed)   Comprehensive metabolic panel (Completed)   Vitamin D deficiency       Relevant Orders   Vitamin D (25 hydroxy) (Completed)   Anxiety and depression       Relevant Medications   hydrOXYzine (VISTARIL) 25 MG capsule   sertraline (ZOLOFT) 50 MG tablet        Meds ordered this encounter  Medications   LARIN 24 FE 1-20 MG-MCG(24) tablet    Sig: Take 1 tablet by mouth daily.    Dispense:  28 tablet    Refill:  5    Order Specific Question:   Supervising Provider    Answer:   Shelva Majestic [[id number]]   hydrOXYzine (VISTARIL) 25 MG capsule    Sig: Take 1 capsule (25 mg total) by mouth at bedtime as needed.    Dispense:  30 capsule    Refill:  5    Order Specific Question:   Supervising Provider    Answer:   Shelva Majestic [[id number]]   sertraline (ZOLOFT) 50 MG tablet    Sig: Take 1 tablet (50 mg total) by mouth daily as needed.    Dispense:  30 tablet    Refill:  5    Order Specific Question:   Supervising Provider    Answer:   Shelva Majestic [[id number]]   Plan: New patient establishment Recheck labs as requested Medications  refilled;  stable She has been on diethylpropion for quite some time for weight control; informed I would need prior records and time to consider if this is something in her best interest to continue or not Pt to call sooner if any concerns     Return in about 6 months (around 03/05/2022) for CPE, fasting labs.    Asaph Serena M Reinaldo Helt, PA-C

## 2021-09-03 LAB — IRON,TIBC AND FERRITIN PANEL
%SAT: 25 % (calc) (ref 16–45)
Ferritin: 12 ng/mL — ABNORMAL LOW (ref 16–154)
Iron: 85 ug/dL (ref 40–190)
TIBC: 338 mcg/dL (calc) (ref 250–450)

## 2021-09-03 LAB — PTH, INTACT (ICMA) AND IONIZED CALCIUM
Calcium, Ion: 5.1 mg/dL (ref 4.7–5.5)
Calcium: 8.9 mg/dL (ref 8.6–10.2)
PTH: 17 pg/mL (ref 16–77)

## 2021-09-04 ENCOUNTER — Other Ambulatory Visit: Payer: Self-pay

## 2021-09-04 MED ORDER — VITAMIN D (ERGOCALCIFEROL) 1.25 MG (50000 UNIT) PO CAPS: 50000.0000 [IU] | ORAL_CAPSULE | ORAL | 0 refills | Status: DC

## 2021-09-08 ENCOUNTER — Other Ambulatory Visit: Payer: Self-pay

## 2021-09-08 DIAGNOSIS — Z7689 Persons encountering health services in other specified circumstances: Secondary | ICD-10-CM

## 2021-09-11 ENCOUNTER — Other Ambulatory Visit: Payer: Self-pay

## 2021-09-19 ENCOUNTER — Other Ambulatory Visit: Payer: Self-pay

## 2021-09-19 DIAGNOSIS — R413 Other amnesia: Secondary | ICD-10-CM

## 2021-09-19 MED ORDER — ALBUTEROL SULFATE HFA 108 (90 BASE) MCG/ACT IN AERS: 2.0000 | INHALATION_SPRAY | Freq: Four times a day (QID) | RESPIRATORY_TRACT | 0 refills | Status: DC | PRN

## 2021-09-24 ENCOUNTER — Encounter: Payer: Self-pay | Admitting: Internal Medicine

## 2021-09-26 ENCOUNTER — Encounter: Payer: Self-pay | Admitting: Physician Assistant

## 2021-10-02 ENCOUNTER — Ambulatory Visit: Payer: Medicaid Other | Admitting: Physician Assistant

## 2021-10-02 ENCOUNTER — Encounter: Payer: Self-pay | Admitting: Physician Assistant

## 2021-10-02 VITALS — BP 112/78 | HR 77 | Temp 98.6°F | Ht 65.0 in | Wt 195.6 lb

## 2021-10-02 DIAGNOSIS — F439 Reaction to severe stress, unspecified: Secondary | ICD-10-CM | POA: Diagnosis not present

## 2021-10-02 DIAGNOSIS — Z23 Encounter for immunization: Secondary | ICD-10-CM | POA: Diagnosis not present

## 2021-10-02 DIAGNOSIS — R5383 Other fatigue: Secondary | ICD-10-CM | POA: Diagnosis not present
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
## 2021-10-03 ENCOUNTER — Ambulatory Visit: Payer: Medicaid Other | Admitting: Internal Medicine

## 2021-10-04 ENCOUNTER — Encounter: Payer: Self-pay | Admitting: Physician Assistant

## 2021-10-16 ENCOUNTER — Encounter: Payer: Self-pay | Admitting: Physician Assistant

## 2021-11-11 ENCOUNTER — Ambulatory Visit: Payer: Medicaid Other | Admitting: Physician Assistant

## 2021-11-11 ENCOUNTER — Encounter: Payer: Self-pay | Admitting: Physician Assistant

## 2021-11-11 VITALS — BP 111/71 | HR 78 | Temp 98.0°F | Resp 16 | Ht 64.0 in | Wt 193.4 lb

## 2021-11-11 DIAGNOSIS — M25511 Pain in right shoulder: Secondary | ICD-10-CM | POA: Diagnosis not present

## 2021-11-11 DIAGNOSIS — E559 Vitamin D deficiency, unspecified: Secondary | ICD-10-CM

## 2021-11-11 DIAGNOSIS — N926 Irregular menstruation, unspecified: Secondary | ICD-10-CM

## 2021-11-11 LAB — VITAMIN D 25 HYDROXY (VIT D DEFICIENCY, FRACTURES): VITD: 23.95 ng/mL — ABNORMAL LOW (ref 30.00–100.00)

## 2021-11-11 LAB — VITAMIN B12: Vitamin B-12: 312 pg/mL (ref 211–911)

## 2021-11-11 LAB — TSH: TSH: 1.82 u[IU]/mL (ref 0.35–5.50)

## 2021-11-11 NOTE — Progress Notes (Signed)
Subjective:    Patient ID: Kelsey Black, female    DOB: [birth year], 31 y.o.   MRN: [id number]  Chief Complaint  Patient presents with   abnormal cycle    10/29/21-10/31/21 Extremely light period, usually heavy bleeder.  Negative at home pregnancy test    Chest Pain    1-2 times weekly for the past month, pain going from chest down her right arm     Chest Pain    Patient is in today for two concerns:  Abnormally light LMP - hx of endometriosis, normally very heavy cycles; this last one only lasted two days; took a negative UPT. Would like blood check today. No other symptoms.   Chest pain - states intermittent sharp shooting pain from upper arm into R back shoulder and upper right chest. Going on the last few months a few times per week. No tx's tried yet so far.   Past Medical History:  Diagnosis Date   Anemia    Chorioamnionitis 2019   Depression    Endometriosis    Family history of adverse reaction to anesthesia    mother woke up during abdominal surgery 3 years ago   Family history of Black cancer 04/04/2021   Nausea & vomiting    last month   Pregnancy    19 weeks and few days michelle horvath ob dr   Symptomatic cholelithiasis     Past Surgical History:  Procedure Laterality Date   CHOLECYSTECTOMY N/A 11/16/2016   Procedure: LAPAROSCOPIC CHOLECYSTECTOMY;  Surgeon: Andria Meuse, MD;  Location: [facility];  Service: General;  Laterality: N/A;   COLONOSCOPY W/ POLYPECTOMY  2023   HEMORRHOID SURGERY N/A 05/13/2021   Procedure: INTERNAL AND EXTERNAL HEMORRHOIDECTOMY;  Surgeon: Violeta Gelinas, MD;  Location: [facility] OR;  Service: General;  Laterality: N/A;   TOOTH EXTRACTION      Family History  Problem Relation Age of Onset   Asthma Mother    Asthma Father    Epilepsy Father    Diabetes Father    Asthma Sister    Asthma Brother    Breast cancer Maternal Aunt 3   Diabetes Maternal Grandfather    Hypertension Maternal Grandfather    Black cancer  Maternal Grandfather 73    Social History   Tobacco Use   Smoking status: Never   Smokeless tobacco: Never  Vaping Use   Vaping Use: Some days  Substance Use Topics   Alcohol use: Yes    Comment: social   Drug use: No     Allergies  Allergen Reactions   Penicillins Swelling and Other (See Comments)    Reaction:  Arm/leg swelling  Has patient had a PCN reaction causing immediate rash, facial/tongue/throat swelling, SOB or lightheadedness with hypotension: Yes Has patient had a PCN reaction causing severe rash involving mucus membranes or skin necrosis: No Has patient had a PCN reaction that required hospitalization: No Has patient had a PCN reaction occurring within the last 10 years: No If all of the above answers are "NO", then may proceed with Cephalosporin use.   Shrimp [Shellfish Allergy] Swelling and Other (See Comments)    Localized swelling    Review of Systems  Cardiovascular:  Positive for chest pain.   NEGATIVE UNLESS OTHERWISE INDICATED IN HPI      Objective:     BP 111/71   Pulse 78   Temp 98 F (36.7 C) (Temporal)   Resp 16   Ht 5\' 4"  (1.626 m)   Wt  193 lb 6.4 oz (87.7 kg)   LMP 10/29/2021 (Exact Date)   SpO2 98%   BMI 33.20 kg/m   Wt Readings from Last 3 Encounters:  11/11/21 193 lb 6.4 oz (87.7 kg)  10/02/21 195 lb 9.6 oz (88.7 kg)  09/02/21 193 lb 3.2 oz (87.6 kg)    BP Readings from Last 3 Encounters:  11/11/21 111/71  10/02/21 112/78  09/02/21 113/73     Physical Exam Constitutional:      Appearance: She is well-developed.  Cardiovascular:     Heart sounds: Normal heart sounds. No murmur heard. Pulmonary:     Effort: Pulmonary effort is normal.     Breath sounds: Normal breath sounds.  Abdominal:     General: Bowel sounds are normal.     Palpations: Abdomen is soft.  Musculoskeletal:     Comments: Positive R trigger point R rhomboid, TTP, reproduces symptoms described.  Negative spurling test in neck. No weakness in upper  extremities.   Neurological:     Mental Status: She is alert.        Assessment & Plan:  Abnormal short menstrual cycle -     hCG, serum, qualitative -     TSH -     VITAMIN D 25 Hydroxy (Vit-D Deficiency, Fractures)  Vitamin D deficiency -     VITAMIN D 25 Hydroxy (Vit-D Deficiency, Fractures)  Trigger point of right shoulder region -     Vitamin B12 -     Ambulatory referral to Physical Therapy   Check labs today as requested. Probably needs to supplement Vit Daily - 2000 to 4000 iu.  R trigger point - try tennis ball to roll this out on wall; ? Maybe dry needling / eval with PT.    This note was prepared with assistance of Systems analyst. Occasional wrong-word or sound-a-like substitutions may have occurred due to the inherent limitations of voice recognition software.   Shatoria Stooksbury M Jessilynn Taft, PA-C

## 2021-11-12 ENCOUNTER — Ambulatory Visit: Payer: Medicaid Other | Admitting: Physician Assistant

## 2021-11-12 LAB — HCG, SERUM, QUALITATIVE: Preg, Serum: NEGATIVE

## 2021-11-22 ENCOUNTER — Other Ambulatory Visit: Payer: Self-pay | Admitting: Physician Assistant

## 2021-11-24 ENCOUNTER — Ambulatory Visit: Payer: Medicaid Other | Admitting: Gastroenterology

## 2021-11-24 ENCOUNTER — Encounter: Payer: Self-pay | Admitting: Gastroenterology

## 2021-11-24 VITALS — BP 100/70 | HR 77 | Ht 59.0 in | Wt 190.2 lb

## 2021-11-24 DIAGNOSIS — K625 Hemorrhage of anus and rectum: Secondary | ICD-10-CM

## 2021-11-24 DIAGNOSIS — K6289 Other specified diseases of anus and rectum: Secondary | ICD-10-CM

## 2021-11-24 MED ORDER — NIFEDIPINE 20 MG PO CAPS: 20.0000 mg | ORAL_CAPSULE | Freq: Two times a day (BID) | ORAL | 3 refills | Status: DC

## 2021-11-24 NOTE — Patient Instructions (Addendum)
It was my pleasure to provide care to you today. Based on our discussion, I am providing you with my recommendations below:  RECOMMENDATION(S):   Add a daily stool bulking agent such as Metamucil or Benefiber.  Continue to use your daily fiber Gummies.  Drink at least 64 ounces of water daily.  I recommended a trial of nifedipine to be taken twice daily to try to help with your rectal pain.  The most frequent side effects with nifedipine include dizziness, chest pain, and headache.  Please follow-up with a surgeon to determine if there are local therapies that may improve your pain.  We have referred you to [facility]:  A referral, your demographics, a copy of your insurance card and your records will be sent to Renelda Mom, MD. Dennis Bast will receive a call from their office regarding the date, time and location of your appointment.   FOLLOW UP:  I would like for you to follow up with me as needed. Please call the office at [phone] to schedule your appointment.  BMI:  If you are age 24 or older, your body mass index should be between 23-30. Your Body mass index is 38.43 kg/m. If this is out of the aforementioned range listed, please consider follow up with your Primary Care Provider.  If you are age 56 or younger, your body mass index should be between 19-25. Your Body mass index is 38.43 kg/m. If this is out of the aformentioned range listed, please consider follow up with your Primary Care Provider.   MY CHART:  The [facility] providers would like to encourage you to use [facility] to communicate with providers for non-urgent requests or questions.  Due to long hold times on the telephone, sending your provider a message by [facility] may be a faster and more efficient way to get a response.  Please allow 48 business hours for a response.  Please remember that this is for non-urgent requests.   Thank you for trusting me with your gastrointestinal care!    Thornton Park, MD, MPH

## 2021-11-24 NOTE — Progress Notes (Signed)
Photo at time of rectal exam

## 2021-11-24 NOTE — Progress Notes (Signed)
Referring Provider: Allwardt, Crist Infante, PA-C Primary Care Physician:  Allwardt, Crist Infante, PA-C  Reason for Consultation:  Rectal pain and bleeding   IMPRESSION:  Intractactable rectal pain and bleeding following internal and external hemorrhoidectomy 3/23 with ongoing pain and rectal bleeding.  Concerns for anal stricture on rectal exam. Differential also includes neuropathological causes to the pain and post-defecation pain syndrome. Perhaps she is a candidate for local injection of analgesics and anti-inflammatory/steroids and to determine need for possible anal dilators  Chronic constipation.  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  Exacerbating her post-hemorrhoidectomy pain.  We discussed strategies to minimize constipation.   Recent colonoscopy with Toma Copier for a family history of Black cancer.  She agreed to sign a release so that we could obtain her recent colonoscopy report for our records.   PLAN: - Daily stool bulking agents, drink plenty of water, avoid constipation - Referral to [facility] surgery - Obtain records from [facility] re: recent colonoscopy - Empiric trial of oral nifedipine 20 mg BID   HPI: Kelsey Black is a 31 y.o. female referred for rectal pain and bleeding.  The history is obtained through the patient review of her electronic health record.  She had internal and external hemorrhoidectomy 05/13/2021 with Dr. Janee Morn after several attempts with banding.  Since then she has ongoing issues with rectal pain with defecation, need to strain with defecation and bleeding despite the use of MiraLAX 3 times daily, milk of magnesia, topical lidocaine and stool softeners, gummy fibers, Colace. She is also experiencing rectal itching. For a while she was using oxycodone for pain but this worsened her constipation and she is afraid to take it any further.  The pain has been severe enough that she has been seen in the emergency room several times for pain,  constipation, and associated presyncope.  She has also been using sitz bath's. She   On her last visit with Dr. Janee Morn 08/01/2021 he felt that she was improving and recommended a 1 month follow-up.  But, her symptoms have not improved any further since that time.  CT of the abdomen and pelvis obtained during ER visits 05/20/21 and 07/26/2021 showed a moderate volume of stool throughout the Black, diverticulosis, and evidence of prior appendectomy.  There was no evidence for perianal or perirectal fistula or abscess.  She had a colonoscopy prior to surgery with Toma Copier earlier this year because of a family history.  Per her report, the colonoscopy was normal.  Grandfather with Black cancer. Her mother is my patient and has a history of Black polyps. There is no other known family history of Black cancer or polyps. No family history of stomach cancer or other GI malignancy. No family history of inflammatory bowel disease or celiac.    Past Medical History:  Diagnosis Date   Anemia    Chorioamnionitis 2019   Black polyps    Depression    Endometriosis    Family history of adverse reaction to anesthesia    mother woke up during abdominal surgery 3 years ago   Family history of Black cancer 04/04/2021   Nausea & vomiting    last month   Pregnancy    19 weeks and few days michelle horvath ob dr   Symptomatic cholelithiasis     Past Surgical History:  Procedure Laterality Date   CHOLECYSTECTOMY N/A 11/16/2016   Procedure: LAPAROSCOPIC CHOLECYSTECTOMY;  Surgeon: Andria Meuse, MD;  Location: [facility];  Service: General;  Laterality: N/A;  COLONOSCOPY W/ POLYPECTOMY  2023   HEMORRHOID SURGERY N/A 05/13/2021   Procedure: INTERNAL AND EXTERNAL HEMORRHOIDECTOMY;  Surgeon: Georganna Skeans, MD;  Location: [place];  Service: General;  Laterality: N/A;   TOOTH EXTRACTION        Current Outpatient Medications  Medication Sig Dispense Refill   acetaminophen (TYLENOL) 500 MG tablet Take  1,000 mg by mouth every 6 (six) hours as needed for mild pain.     albuterol (VENTOLIN HFA) 108 (90 Base) MCG/ACT inhaler Inhale 2 puffs into the lungs every 6 (six) hours as needed for wheezing or shortness of breath. 8 g 0   Diethylpropion HCl CR 75 MG TB24 Take 75 mg by mouth daily.     docusate sodium (COLACE) 100 MG capsule Take 1 capsule (100 mg total) by mouth every 12 (twelve) hours. 60 capsule 0   EPIPEN 2-PAK 0.3 MG/0.3ML SOAJ injection Inject 0.3 mg into the muscle as needed for anaphylaxis.     LARIN 24 FE 1-20 MG-MCG(24) tablet Take 1 tablet by mouth daily. 28 tablet 5   levocetirizine (XYZAL) 5 MG tablet Take 5 mg by mouth daily as needed for allergies.     NIFEdipine (PROCARDIA) 20 MG capsule Take 1 capsule (20 mg total) by mouth 2 (two) times daily. 60 capsule 3   ondansetron (ZOFRAN-ODT) 4 MG disintegrating tablet Take 1 tablet (4 mg total) by mouth every 6 (six) hours as needed for nausea or vomiting. 20 tablet 0   Vitamin D, Ergocalciferol, (DRISDOL) 1.25 MG (50000 UNIT) CAPS capsule Take 1 capsule by mouth once a week 12 capsule 0   fluticasone (FLONASE) 50 MCG/ACT nasal spray Place 2 sprays into both nostrils daily for 7 days. (Patient taking differently: Place 2 sprays into both nostrils daily as needed for allergies.) 1 g 0   hydrOXYzine (VISTARIL) 25 MG capsule Take 1 capsule (25 mg total) by mouth at bedtime as needed. (Patient not taking: Reported on 11/24/2021) 30 capsule 5   lidocaine (XYLOCAINE) 5 % ointment Apply 1 application. topically as needed. (Patient not taking: Reported on 11/24/2021) 35.44 g 0   polyethylene glycol (MIRALAX / GLYCOLAX) 17 g packet Take 17 g by mouth daily. (Patient not taking: Reported on 11/24/2021) 14 each 0   promethazine (PHENERGAN) 25 MG tablet Take 25 mg by mouth every 6 (six) hours as needed for nausea/vomiting. (Patient not taking: Reported on 11/24/2021)     promethazine-dextromethorphan (PROMETHAZINE-DM) 6.25-15 MG/5ML syrup Take 5 mLs by  mouth 4 (four) times daily as needed for cough. (Patient not taking: Reported on 11/24/2021) 118 mL 0   sertraline (ZOLOFT) 50 MG tablet Take 1 tablet (50 mg total) by mouth daily as needed. (Patient not taking: Reported on 11/24/2021) 30 tablet 5   No current facility-administered medications for this visit.    Allergies as of 11/24/2021 - Review Complete 11/24/2021  Allergen Reaction Noted   Penicillins Swelling and Other (See Comments) 01/19/2011   Shrimp [shellfish allergy] Swelling and Other (See Comments) 01/19/2011    Family History  Problem Relation Age of Onset   Asthma Mother    Asthma Father    Epilepsy Father    Diabetes Father    Asthma Sister    Asthma Brother    Breast cancer Maternal Aunt 54   Diabetes Maternal Grandfather    Hypertension Maternal Grandfather    Black cancer Maternal Grandfather 78    Social History   Socioeconomic History   Marital status: Single    Spouse  name: Not on file   Number of children: Not on file   Years of education: Not on file   Highest education level: Not on file  Occupational History   Not on file  Tobacco Use   Smoking status: Never   Smokeless tobacco: Never  Vaping Use   Vaping Use: Some days  Substance and Sexual Activity   Alcohol use: Yes    Comment: social   Drug use: No   Sexual activity: Yes  Other Topics Concern   Not on file  Social History Narrative   ** Merged History Encounter **       Social Determinants of Health   Financial Resource Strain: Not on file  Food Insecurity: Not on file  Transportation Needs: Not on file  Physical Activity: Not on file  Stress: Not on file  Social Connections: Not on file  Intimate Partner Violence: Not on file    Review of Systems: 12 system ROS is negative except as noted above.   Physical Exam: General:   Alert,  well-nourished, pleasant and cooperative in NAD Head:  Normocephalic and atraumatic. Eyes:  Sclera clear, no icterus.   Conjunctiva  pink. Ears:  Normal auditory acuity. Nose:  No deformity, discharge,  or lesions. Mouth:  No deformity or lesions.   Neck:  Supple; no masses or thyromegaly. Lungs:  Clear throughout to auscultation.   No wheezes. Heart:  Regular rate and rhythm; no murmurs. Abdomen:  Soft, nontender, nondistended, normal bowel sounds, no rebound or guarding. No hepatosplenomegaly.   Rectal:   No chemical dermatitis. Very tender exam. The anal canal is very narrow and she does not tolerate a full exam. No fissure or fistula. No prolapsing hemorrhoids. No stool in the rectal vault. No mass or fecal impaction. Normal anal resting tone.  Chaperone: Heather Msk:  Symmetrical. No boney deformities LAD: No inguinal or umbilical LAD Extremities:  No clubbing or edema. Neurologic:  Alert and  oriented x4;  grossly nonfocal Skin:  Intact without significant lesions or rashes. Psych:  Alert and cooperative. Normal mood and affect.     Asante Blanda L. Orvan Falconer, MD, MPH 11/24/2021, 4:29 PM

## 2021-11-27 ENCOUNTER — Encounter: Payer: Self-pay | Admitting: Physical Therapy

## 2021-11-27 ENCOUNTER — Encounter: Payer: Self-pay | Admitting: Gastroenterology

## 2021-11-27 ENCOUNTER — Encounter: Payer: Self-pay | Admitting: Physician Assistant

## 2021-11-27 ENCOUNTER — Other Ambulatory Visit: Payer: Self-pay | Admitting: Physician Assistant

## 2021-11-27 ENCOUNTER — Ambulatory Visit: Payer: Medicaid Other | Admitting: Physical Therapy

## 2021-11-27 DIAGNOSIS — F439 Reaction to severe stress, unspecified: Secondary | ICD-10-CM

## 2021-11-27 NOTE — Therapy (Unsigned)
OUTPATIENT PHYSICAL THERAPY UPPER EXTREMITY EVALUATION   Patient Name: Kelsey Black MRN: [id number] DOB:[birth year], 31 y.o., female Today's Date: 11/27/2021    Past Medical History:  Diagnosis Date   Anemia    Chorioamnionitis 2019   Black polyps    Depression    Endometriosis    Family history of adverse reaction to anesthesia    mother woke up during abdominal surgery 3 years ago   Family history of Black cancer 04/04/2021   Nausea & vomiting    last month   Pregnancy    19 weeks and few days michelle horvath ob dr   Symptomatic cholelithiasis    Past Surgical History:  Procedure Laterality Date   CHOLECYSTECTOMY N/A 11/16/2016   Procedure: LAPAROSCOPIC CHOLECYSTECTOMY;  Surgeon: Ileana Roup, MD;  Location: [facility];  Service: General;  Laterality: N/A;   COLONOSCOPY W/ POLYPECTOMY  2023   HEMORRHOID SURGERY N/A 05/13/2021   Procedure: INTERNAL AND EXTERNAL HEMORRHOIDECTOMY;  Surgeon: Georganna Skeans, MD;  Location: [place];  Service: General;  Laterality: N/A;   TOOTH EXTRACTION     Patient Active Problem List   Diagnosis Date Noted   Genetic testing 04/22/2021   Family history of breast cancer 04/04/2021   Family history of Black cancer 04/04/2021   Indication for care in labor or delivery 03/22/2017   Chronic cholecystitis 11/16/2016   Bacterial vaginosis 08/01/2014    PCP: ***  REFERRING PROVIDER: ***  REFERRING DIAG: ***  THERAPY DIAG:  No diagnosis found.  Rationale for Evaluation and Treatment {HABREHAB:27488}  ONSET DATE: ***  SUBJECTIVE:                                                                                                                                                                                      SUBJECTIVE STATEMENT:  Pain in R shoulder.  More pain with laying down, numbness and tingling into R UE.  R handed.  Constant soreness. No triggers.  Stays at home with 31 year old.     PERTINENT  HISTORY: ***  PAIN:  Are you having pain? {OPRCPAIN:27236}  PRECAUTIONS: {Therapy precautions:24002}  WEIGHT BEARING RESTRICTIONS {Yes ***/No:24003}  FALLS:  Has patient fallen in last 6 months? {fallsyesno:27318}  LIVING ENVIRONMENT: Lives with: {OPRC lives with:25569::"lives with their family"} Lives in: {Lives in:25570} Stairs: {opstairs:27293} Has following equipment at home: {Assistive devices:23999}  OCCUPATION: ***  PLOF: {PLOF:24004}  PATIENT GOALS ***  OBJECTIVE:   DIAGNOSTIC FINDINGS:  ***  PATIENT SURVEYS:  {rehab surveys:24030:a}  COGNITION:  Overall cognitive status: {cognition:24006}     SENSATION: {sensation:27233}  POSTURE: ***  UPPER EXTREMITY ROM:   {AROM/PROM:27142} ROM Right eval Left  eval  Shoulder flexion    Shoulder extension    Shoulder abduction    Shoulder adduction    Shoulder internal rotation    Shoulder external rotation    Elbow flexion    Elbow extension    Wrist flexion    Wrist extension    Wrist ulnar deviation    Wrist radial deviation    Wrist pronation    Wrist supination    (Blank rows = not tested)  UPPER EXTREMITY MMT:  MMT Right eval Left eval  Shoulder flexion    Shoulder extension    Shoulder abduction    Shoulder adduction    Shoulder internal rotation    Shoulder external rotation    Middle trapezius    Lower trapezius    Elbow flexion    Elbow extension    Wrist flexion    Wrist extension    Wrist ulnar deviation    Wrist radial deviation    Wrist pronation    Wrist supination    Grip strength (lbs)    (Blank rows = not tested)  SHOULDER SPECIAL TESTS:  Impingement tests: {shoulder impingement test:25231:a}  SLAP lesions: {SLAP lesions:25232}  Instability tests: {shoulder instability test:25233}  Rotator cuff assessment: {rotator cuff assessment:25234}  Biceps assessment: {biceps assessment:25235}  JOINT MOBILITY TESTING:  ***  PALPATION:  ***   TODAY'S TREATMENT:   ***   PATIENT EDUCATION: Education details: *** Person educated: {Person educated:25204} Education method: {Education Method:25205} Education comprehension: {Education Comprehension:25206}   HOME EXERCISE PROGRAM: ***  ASSESSMENT:  CLINICAL IMPRESSION: Patient is a *** y.o. *** who was seen today for physical therapy evaluation and treatment for ***.    OBJECTIVE IMPAIRMENTS {opptimpairments:25111}.   ACTIVITY LIMITATIONS {activitylimitations:27494}  PARTICIPATION LIMITATIONS: {participationrestrictions:25113}  PERSONAL FACTORS {Personal factors:25162} are also affecting patient's functional outcome.   REHAB POTENTIAL: {rehabpotential:25112}  CLINICAL DECISION MAKING: {clinical decision making:25114}  EVALUATION COMPLEXITY: {Evaluation complexity:25115}   GOALS: Goals reviewed with patient? {yes/no:20286}  SHORT TERM GOALS: Target date: {follow up:25551}  (Remove Blue Hyperlink)  *** Baseline: Goal status: {GOALSTATUS:25110}  2.  *** Baseline:  Goal status: {GOALSTATUS:25110}  3.  *** Baseline:  Goal status: {GOALSTATUS:25110}  4.  *** Baseline:  Goal status: {GOALSTATUS:25110}  5.  *** Baseline:  Goal status: {GOALSTATUS:25110}  6.  *** Baseline:  Goal status: {GOALSTATUS:25110}  LONG TERM GOALS: Target date: {follow up:25551}  (Remove Blue Hyperlink)  *** Baseline:  Goal status: {GOALSTATUS:25110}  2.  *** Baseline:  Goal status: {GOALSTATUS:25110}  3.  *** Baseline:  Goal status: {GOALSTATUS:25110}  4.  *** Baseline:  Goal status: {GOALSTATUS:25110}  5.  *** Baseline:  Goal status: {GOALSTATUS:25110}  6.  *** Baseline:  Goal status: {GOALSTATUS:25110}   PLAN: PT FREQUENCY: {rehab frequency:25116}  PT DURATION: {rehab duration:25117}  PLANNED INTERVENTIONS: {rehab planned interventions:25118::"Therapeutic exercises","Therapeutic activity","Neuromuscular re-education","Balance training","Gait training","Patient/Family  education","Self Care","Joint mobilization"}  PLAN FOR NEXT SESSION: ***   Lyndee Hensen, PT 11/27/2021, 11:52 AM

## 2021-12-01 ENCOUNTER — Other Ambulatory Visit: Payer: Self-pay | Admitting: Physician Assistant

## 2021-12-01 DIAGNOSIS — F439 Reaction to severe stress, unspecified: Secondary | ICD-10-CM

## 2021-12-01 DIAGNOSIS — M25511 Pain in right shoulder: Secondary | ICD-10-CM

## 2021-12-01 DIAGNOSIS — F32A Depression, unspecified: Secondary | ICD-10-CM

## 2021-12-02 NOTE — Progress Notes (Unsigned)
Kelsey Black D.Naranja Revillo Phone: [phone]   Assessment and Plan:     There are no diagnoses linked to this encounter.  ***   Pertinent previous records reviewed include ***   Follow Up: ***     Subjective:   I, Kelsey Black, am serving as a Education administrator for Doctor Glennon Mac  Chief Complaint: right shoulder pain   HPI:   12/03/2021 Patient is a 31 year old female complaining of right shoulder pain. Patient states  Relevant Historical Information: ***  Additional pertinent review of systems negative.   Current Outpatient Medications:    acetaminophen (TYLENOL) 500 MG tablet, Take 1,000 mg by mouth every 6 (six) hours as needed for mild pain., Disp: , Rfl:    albuterol (VENTOLIN HFA) 108 (90 Base) MCG/ACT inhaler, Inhale 2 puffs into the lungs every 6 (six) hours as needed for wheezing or shortness of breath., Disp: 8 g, Rfl: 0   Diethylpropion HCl CR 75 MG TB24, Take 75 mg by mouth daily., Disp: , Rfl:    docusate sodium (COLACE) 100 MG capsule, Take 1 capsule (100 mg total) by mouth every 12 (twelve) hours., Disp: 60 capsule, Rfl: 0   EPIPEN 2-PAK 0.3 MG/0.3ML SOAJ injection, Inject 0.3 mg into the muscle as needed for anaphylaxis., Disp: , Rfl:    fluticasone (FLONASE) 50 MCG/ACT nasal spray, Place 2 sprays into both nostrils daily for 7 days. (Patient taking differently: Place 2 sprays into both nostrils daily as needed for allergies.), Disp: 1 g, Rfl: 0   hydrOXYzine (VISTARIL) 25 MG capsule, Take 1 capsule (25 mg total) by mouth at bedtime as needed. (Patient not taking: Reported on 11/24/2021), Disp: 30 capsule, Rfl: 5   LARIN 24 FE 1-20 MG-MCG(24) tablet, Take 1 tablet by mouth daily., Disp: 28 tablet, Rfl: 5   levocetirizine (XYZAL) 5 MG tablet, Take 5 mg by mouth daily as needed for allergies., Disp: , Rfl:    lidocaine (XYLOCAINE) 5 % ointment, Apply 1 application. topically as needed.  (Patient not taking: Reported on 11/24/2021), Disp: 35.44 g, Rfl: 0   NIFEdipine (PROCARDIA) 20 MG capsule, Take 1 capsule (20 mg total) by mouth 2 (two) times daily., Disp: 60 capsule, Rfl: 3   ondansetron (ZOFRAN-ODT) 4 MG disintegrating tablet, Take 1 tablet (4 mg total) by mouth every 6 (six) hours as needed for nausea or vomiting., Disp: 20 tablet, Rfl: 0   polyethylene glycol (MIRALAX / GLYCOLAX) 17 g packet, Take 17 g by mouth daily. (Patient not taking: Reported on 11/24/2021), Disp: 14 each, Rfl: 0   promethazine (PHENERGAN) 25 MG tablet, Take 25 mg by mouth every 6 (six) hours as needed for nausea/vomiting. (Patient not taking: Reported on 11/24/2021), Disp: , Rfl:    promethazine-dextromethorphan (PROMETHAZINE-DM) 6.25-15 MG/5ML syrup, Take 5 mLs by mouth 4 (four) times daily as needed for cough. (Patient not taking: Reported on 11/24/2021), Disp: 118 mL, Rfl: 0   sertraline (ZOLOFT) 50 MG tablet, Take 1 tablet (50 mg total) by mouth daily as needed. (Patient not taking: Reported on 11/24/2021), Disp: 30 tablet, Rfl: 5   Vitamin D, Ergocalciferol, (DRISDOL) 1.25 MG (50000 UNIT) CAPS capsule, Take 1 capsule by mouth once a week, Disp: 12 capsule, Rfl: 0   Objective:     There were no vitals filed for this visit.    There is no height or weight on file to calculate BMI.    Physical Exam:    ***  Electronically signed by:  Kelsey Black D.[facility] 7:44 AM 12/02/21

## 2021-12-03 ENCOUNTER — Ambulatory Visit: Payer: Medicaid Other | Admitting: Sports Medicine

## 2021-12-03 VITALS — BP 120/80 | HR 68 | Ht 59.0 in | Wt 190.0 lb

## 2021-12-03 DIAGNOSIS — S46812A Strain of other muscles, fascia and tendons at shoulder and upper arm level, left arm, initial encounter: Secondary | ICD-10-CM

## 2021-12-03 DIAGNOSIS — S46811A Strain of other muscles, fascia and tendons at shoulder and upper arm level, right arm, initial encounter: Secondary | ICD-10-CM | POA: Diagnosis not present

## 2021-12-03 MED ORDER — MELOXICAM 15 MG PO TABS: 15.0000 mg | ORAL_TABLET | Freq: Every day | ORAL | 0 refills | Status: DC

## 2021-12-03 NOTE — Patient Instructions (Addendum)
Good to see you - Start meloxicam 15 mg daily x2 weeks.  If still having pain after 2 weeks, complete 3rd-week of meloxicam. May use remaining meloxicam as needed once daily for pain control.  Do not to use additional NSAIDs while taking meloxicam.  May use Tylenol [phone] mg 2 to 3 times a day for breakthrough pain. Trap HEP  3-4 week follow up

## 2021-12-10 ENCOUNTER — Encounter: Payer: Self-pay | Admitting: Physical Therapy

## 2021-12-10 ENCOUNTER — Ambulatory Visit: Payer: Medicaid Other | Attending: Physician Assistant | Admitting: Physical Therapy

## 2021-12-10 DIAGNOSIS — M25511 Pain in right shoulder: Secondary | ICD-10-CM | POA: Diagnosis not present

## 2021-12-10 DIAGNOSIS — M6281 Muscle weakness (generalized): Secondary | ICD-10-CM | POA: Insufficient documentation

## 2021-12-10 DIAGNOSIS — G8929 Other chronic pain: Secondary | ICD-10-CM | POA: Insufficient documentation

## 2021-12-10 DIAGNOSIS — R202 Paresthesia of skin: Secondary | ICD-10-CM | POA: Diagnosis present

## 2021-12-10 DIAGNOSIS — R200 Anesthesia of skin: Secondary | ICD-10-CM | POA: Diagnosis present

## 2021-12-10 DIAGNOSIS — R278 Other lack of coordination: Secondary | ICD-10-CM | POA: Diagnosis present

## 2021-12-10 NOTE — Therapy (Addendum)
OUTPATIENT PHYSICAL THERAPY SHOULDER EVALUATION   Patient Name: Aliya Sol Colon MRN: [id number] DOB:1991-01-19, 31 y.o., female Today's Date: 12/10/2021   PT End of Session - 12/10/21 1440     Visit Number 1    Date for PT Re-Evaluation 03/04/22    PT Start Time 9983    PT Stop Time 1224    PT Time Calculation (min) 39 min    Activity Tolerance Patient limited by pain    Behavior During Therapy [facility] for tasks assessed/performed             Past Medical History:  Diagnosis Date   Anemia    Chorioamnionitis 2019   Colon polyps    Depression    Endometriosis    Family history of adverse reaction to anesthesia    mother woke up during abdominal surgery 3 years ago   Family history of colon cancer 04/04/2021   Nausea & vomiting    last month   Pregnancy    19 weeks and few days michelle horvath ob dr   Symptomatic cholelithiasis    Past Surgical History:  Procedure Laterality Date   CHOLECYSTECTOMY N/A 11/16/2016   Procedure: LAPAROSCOPIC CHOLECYSTECTOMY;  Surgeon: Ileana Roup, MD;  Location: [facility];  Service: General;  Laterality: N/A;   COLONOSCOPY W/ POLYPECTOMY  2023   HEMORRHOID SURGERY N/A 05/13/2021   Procedure: INTERNAL AND EXTERNAL HEMORRHOIDECTOMY;  Surgeon: Georganna Skeans, MD;  Location: [place];  Service: General;  Laterality: N/A;   TOOTH EXTRACTION     Patient Active Problem List   Diagnosis Date Noted   Genetic testing 04/22/2021   Family history of breast cancer 04/04/2021   Family history of colon cancer 04/04/2021   Indication for care in labor or delivery 03/22/2017   Chronic cholecystitis 11/16/2016   Bacterial vaginosis 08/01/2014    PCP: Allwardt, Randa Evens, PA-C   REFERRING PROVIDER: Allwardt, Randa Evens, PA-C   REFERRING DIAG: M25.511 (ICD-10-CM) - Trigger point of right shoulder region   THERAPY DIAG:  Chronic right shoulder pain  Muscle weakness (generalized)  Other lack of coordination  Numbness and tingling in  both hands  Rationale for Evaluation and Treatment Rehabilitation  ONSET DATE: 11/11/2021   SUBJECTIVE:                                                                                                                                                                                      SUBJECTIVE STATEMENT: Patient reports pain in her back, extending into her arm. Her Dr sent her to Dr Glennon Mac, DO, who gave her some exercises to perform. She also referred patient for therapy.  PERTINENT HISTORY:  1. Trapezius muscle strain, left, initial encounter 2. Trapezius muscle strain, right, initial encounter -Chronic with exacerbation, initial sports medicine visit - Most consistent with strain of bilateral trapezius, bilateral rhomboid, bilateral levator, with these muscles at times spasming to cause right-sided radicular symptoms which were not present on physical exam today - Start HEP focusing on trapezius and thoracic region - Start meloxicam R trigger point - try tennis ball to roll this out on wall; ? Maybe dry needling / eval with PT.   R trigger point - try tennis ball to roll this out on wall; ? Maybe dry needling / eval with PT.   PAIN:  Are you having pain? Yes: NPRS scale: 5/10 Pain location: Upper traps B Pain description: tight, pressure, R > L Aggravating factors: Lying down Relieving factors: No relieving factors  PRECAUTIONS: None  WEIGHT BEARING RESTRICTIONS: No  FALLS:  Has patient fallen in last 6 months? 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  PLOF: Independent  PATIENT GOALS:Be able to perform her daily activities without pain.  OBJECTIVE:   DIAGNOSTIC FINDINGS:  N/A  PATIENT SURVEYS:  FOTO 29  COGNITION: Overall cognitive status: Within functional limits for tasks assessed     SENSATION: Light touch: Impaired   POSTURE: No obvious  abnormalities Cervical ROM- limited in B lateral flexion, 50% R, 60% L  UPPER EXTREMITY ROM:    BUE ROM WNL, but painful to elevate arms fully in flexion or abd.  UPPER EXTREMITY MMT:   BUE strength WNL at elbows and wrists and hands. B shoulder testing limited due to pain, at least 3+/5  JOINT MOBILITY TESTING:  Mod impaired inf glide, but testing may be impeded by pain  PALPATION:  TTP B cervical paraspinals, UT, LS, Rhomboids, Severe on R, tight pects (+) neural tension in BUE  Functional Assessment: NDI-35%    TODAY'S TREATMENT:                                                                                                                           DATE: 12/10/21 Education   PATIENT EDUCATION: Education details: POC Person educated: Patient Education method: Explanation Education comprehension: verbalized understanding  HOME EXERCISE PROGRAM: TBD  ASSESSMENT:  CLINICAL IMPRESSION: Patient is a 31 y.o. who was seen today for physical therapy evaluation and treatment for B upper traps pain. She is tender on both sides, but severely tender on R. She demonstrates impaired strength and ROM due to the pain, is limited in all lifting and daily activities due to severe pain and spasm. She has tightness and trigger points in B upper shoulder musculature, with (+) neural tensioning tests.Patient's pain impedes her ability to care for her 57 YO daughter who is autistic and ADHD. She can not take muscle relaxers due to fatigue and needing to be alert to care for her child. She will benefit from PT to address the  acute, severe pain of muscle spasm, as well as stretch and strengthen all appropriate muscles to prevent recurrence of her debilitating pain.  OBJECTIVE IMPAIRMENTS: decreased activity tolerance, decreased coordination, decreased mobility, decreased ROM, decreased strength, impaired perceived functional ability, increased muscle spasms, impaired flexibility, impaired UE  functional use, improper body mechanics, and pain.   ACTIVITY LIMITATIONS: carrying, lifting, sleeping, dressing, reach over head, hygiene/grooming, and caring for others  PARTICIPATION LIMITATIONS: meal prep, cleaning, laundry, shopping, community activity, and caring for her child  PERSONAL FACTORS: Past/current experiences are also affecting patient's functional outcome.   REHAB POTENTIAL: Good  CLINICAL DECISION MAKING: Evolving/moderate complexity  EVALUATION COMPLEXITY: Moderate   GOALS: Goals reviewed with patient? Yes  SHORT TERM GOALS: Target date: 01/07/2022  (Remove Blue Hyperlink)  I with basic HEP Baseline: Goal status: INITIAL  LONG TERM GOALS: Target date: 03/04/2022  (Remove Blue Hyperlink)  I with final HEP Baseline:  Goal status: INITIAL  2.  Increase FOTO to at least 70 Baseline: 54 Goal status: INITIAL  3.  Patient will demonstrate BUE strength of at least 4/5 with pain < 3/10 in shoulders during testing. Baseline:  Goal status: INITIAL  4.  Patient will perform all of her normal daily activities with pain < 3/10 Baseline:  Goal status: INITIAL  5.  Patient will report resolution of tingling in hands when sleeping or lifting. Baseline:  Goal status: INITIAL  PLAN:  PT FREQUENCY: 1-2x/week  PT DURATION: 12 weeks  PLANNED INTERVENTIONS: Therapeutic exercises, Therapeutic activity, Neuromuscular re-education, Balance training, Gait training, Patient/Family education, Self Care, Joint mobilization, Dry Needling, and Manual therapy  PLAN FOR NEXT SESSION: Initiate HEP, address severe pain and muscle spasm in B up traps and surrounding muscles. **No traction, Ionto, Estim, HOT/Cold pack, Vaso**   Marcelina Morel, DPT 12/10/2021, 2:46 PM

## 2021-12-17 ENCOUNTER — Encounter: Payer: Self-pay | Admitting: Physical Therapy

## 2021-12-17 ENCOUNTER — Ambulatory Visit: Payer: Medicaid Other | Attending: Physician Assistant | Admitting: Physical Therapy

## 2021-12-17 DIAGNOSIS — R202 Paresthesia of skin: Secondary | ICD-10-CM | POA: Diagnosis present

## 2021-12-17 DIAGNOSIS — R200 Anesthesia of skin: Secondary | ICD-10-CM | POA: Insufficient documentation

## 2021-12-17 DIAGNOSIS — M25511 Pain in right shoulder: Secondary | ICD-10-CM | POA: Diagnosis not present

## 2021-12-17 DIAGNOSIS — M6281 Muscle weakness (generalized): Secondary | ICD-10-CM

## 2021-12-17 DIAGNOSIS — G8929 Other chronic pain: Secondary | ICD-10-CM

## 2021-12-17 NOTE — Therapy (Signed)
OUTPATIENT PHYSICAL THERAPY SHOULDER EVALUATION   Patient Name: Kelsey Black MRN: [id number] DOB:Feb 10, 1991, 31 y.o., female Today's Date: 12/17/2021   PT End of Session - 12/17/21 1014     Visit Number 2    Date for PT Re-Evaluation 03/04/22    PT Start Time 1015    PT Stop Time 1100    PT Time Calculation (min) 45 min    Activity Tolerance Patient limited by pain    Behavior During Therapy [facility] for tasks assessed/performed             Past Medical History:  Diagnosis Date   Anemia    Chorioamnionitis 2019   Black polyps    Depression    Endometriosis    Family history of adverse reaction to anesthesia    mother woke up during abdominal surgery 3 years ago   Family history of Black cancer 04/04/2021   Nausea & vomiting    last month   Pregnancy    19 weeks and few days michelle horvath ob dr   Symptomatic cholelithiasis    Past Surgical History:  Procedure Laterality Date   CHOLECYSTECTOMY N/A 11/16/2016   Procedure: LAPAROSCOPIC CHOLECYSTECTOMY;  Surgeon: Ileana Roup, MD;  Location: [facility];  Service: General;  Laterality: N/A;   COLONOSCOPY W/ POLYPECTOMY  2023   HEMORRHOID SURGERY N/A 05/13/2021   Procedure: INTERNAL AND EXTERNAL HEMORRHOIDECTOMY;  Surgeon: Georganna Skeans, MD;  Location: [place];  Service: General;  Laterality: N/A;   TOOTH EXTRACTION     Patient Active Problem List   Diagnosis Date Noted   Genetic testing 04/22/2021   Family history of breast cancer 04/04/2021   Family history of Black cancer 04/04/2021   Indication for care in labor or delivery 03/22/2017   Chronic cholecystitis 11/16/2016   Bacterial vaginosis 08/01/2014    PCP: Allwardt, Randa Evens, PA-C   REFERRING PROVIDER: Allwardt, Randa Evens, PA-C   REFERRING DIAG: M25.511 (ICD-10-CM) - Trigger point of right shoulder region   THERAPY DIAG:  Chronic right shoulder pain  Muscle weakness (generalized)  Numbness and tingling in both hands  Rationale for  Evaluation and Treatment Rehabilitation  ONSET DATE: 11/11/2021   SUBJECTIVE:                                                                                                                                                                                      SUBJECTIVE STATEMENT: "So far so good"  PERTINENT HISTORY: 1. Trapezius muscle strain, left, initial encounter 2. Trapezius muscle strain, right, initial encounter -Chronic with exacerbation, initial sports medicine visit - Most consistent with strain of bilateral trapezius, bilateral rhomboid, bilateral levator,  with these muscles at times spasming to cause right-sided radicular symptoms which were not present on physical exam today - Start HEP focusing on trapezius and thoracic region - Start meloxicam R trigger point - try tennis ball to roll this out on wall; ? Maybe dry needling / eval with PT.   R trigger point - try tennis ball to roll this out on wall; ? Maybe dry needling / eval with PT.   PAIN:  Are you having pain? 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  PLOF: Independent  PATIENT GOALS:Be able to perform her daily activities without pain.  OBJECTIVE:   DIAGNOSTIC FINDINGS:  N/A  PATIENT SURVEYS:  FOTO 55   SENSATION: Light touch: Impaired   POSTURE: No obvious abnormalities Cervical ROM- limited in B lateral flexion, 50% R, 60% L  UPPER EXTREMITY ROM:    BUE ROM WNL, but painful to elevate arms fully in flexion or abd.  UPPER EXTREMITY MMT:   BUE strength WNL at elbows and wrists and hands. B shoulder testing limited due to pain, at least 3+/5  JOINT MOBILITY TESTING:  Mod impaired inf glide, but testing may be impeded by pain  PALPATION:  TTP  B cervical paraspinals, UT, LS, Rhomboids, Severe on R, tight pects (+) neural tension in BUE  Functional Assessment: NDI-35%    TODAY'S TREATMENT: 12/17/21 NuStep L3 x 6 min  Standing bilateral UE flexion with 3lb WaTE bar stopped after two reps, Tried 2lb WaTE stopped after three reps Due to Reports of UT pain Attempted gentle STM to rhomboid and UT area but pt unable to tolerate due to pain    12/10/21 Education   PATIENT EDUCATION: Education details: POC Person educated: Patient Education method: Explanation Education comprehension: verbalized understanding  HOME EXERCISE PROGRAM: TBD  ASSESSMENT:  CLINICAL IMPRESSION: Patient enters with reports of increase mid back pain. She did not tolerated session well. Multiple rest breaks takes with NuStep Warm up due to reports of pain. At the conclusions of warm up pt reported that the pain moved to her upper traps. Bilateral UE flexion cause more pain discontinuing interventions. Pt stated that movement cause her shoulder and id back to hurt mote. Attempted gentle STM to rhomboid and UT area but pt able to tolerate. Applied MHP to mid back area for pain. Advised pt to contact MD. While laying supine pt was observed multiple time with both UE flexed 90 degrees extended to hold her phone .    OBJECTIVE IMPAIRMENTS: decreased activity tolerance, decreased coordination, decreased mobility, decreased ROM, decreased strength, impaired perceived functional ability, increased muscle spasms, impaired flexibility, impaired UE functional use, improper body mechanics, and pain.   ACTIVITY LIMITATIONS: carrying, lifting, sleeping, dressing, reach over head, hygiene/grooming, and caring for others  PARTICIPATION LIMITATIONS: meal prep, cleaning, laundry, shopping, community activity, and caring for her child  PERSONAL FACTORS: Past/current experiences are also affecting patient's functional outcome.   REHAB POTENTIAL: Good  CLINICAL DECISION  MAKING: Evolving/moderate complexity  EVALUATION COMPLEXITY: Moderate   GOALS: Goals reviewed with patient? Yes  SHORT TERM GOALS: Target date: 01/07/2022  (Remove Blue Hyperlink)  I with basic HEP Baseline: Goal status: INITIAL  LONG TERM GOALS: Target date: 03/04/2022  (Remove Blue Hyperlink)  I with final HEP Baseline:  Goal status: INITIAL  2.  Increase FOTO to at least 70 Baseline: 54 Goal status: INITIAL  3.  Patient will demonstrate BUE strength of at least 4/5 with pain < 3/10 in shoulders during testing. Baseline:  Goal status: INITIAL  4.  Patient will perform all of her normal daily activities with pain < 3/10 Baseline:  Goal status: INITIAL  5.  Patient will report resolution of tingling in hands when sleeping or lifting. Baseline:  Goal status: INITIAL  PLAN:  PT FREQUENCY: 1-2x/week  PT DURATION: 12 weeks  PLANNED INTERVENTIONS: Therapeutic exercises, Therapeutic activity, Neuromuscular re-education, Balance training, Gait training, Patient/Family education, Self Care, Joint mobilization, Dry Needling, and Manual therapy  PLAN FOR NEXT SESSION: Initiate HEP, address severe pain and muscle spasm in B up traps and surrounding muscles. **No traction, Ionto, Estim, HOT/Cold pack, Vaso**   Marcelina Morel, DPT 12/17/2021, 10:42 AM

## 2021-12-18 ENCOUNTER — Ambulatory Visit: Payer: Medicaid Other | Admitting: Physician Assistant

## 2021-12-23 ENCOUNTER — Ambulatory Visit: Payer: Medicaid Other

## 2021-12-25 ENCOUNTER — Ambulatory Visit (HOSPITAL_BASED_OUTPATIENT_CLINIC_OR_DEPARTMENT_OTHER): Payer: Medicaid Other | Admitting: Psychiatry

## 2021-12-25 VITALS — BP 104/68 | HR 67

## 2021-12-25 DIAGNOSIS — F331 Major depressive disorder, recurrent, moderate: Secondary | ICD-10-CM

## 2021-12-25 DIAGNOSIS — F4310 Post-traumatic stress disorder, unspecified: Secondary | ICD-10-CM

## 2021-12-25 DIAGNOSIS — E559 Vitamin D deficiency, unspecified: Secondary | ICD-10-CM

## 2021-12-25 MED ORDER — ESCITALOPRAM OXALATE 10 MG PO TABS: 5.0000 mg | ORAL_TABLET | Freq: Every day | ORAL | 1 refills | Status: DC
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
## 2021-12-25 NOTE — Patient Instructions (Signed)
Follow in 4 weeks

## 2021-12-30 ENCOUNTER — Encounter (HOSPITAL_COMMUNITY): Payer: Self-pay | Admitting: Psychiatry

## 2021-12-30 ENCOUNTER — Ambulatory Visit: Payer: Medicaid Other

## 2021-12-30 DIAGNOSIS — K625 Hemorrhage of anus and rectum: Secondary | ICD-10-CM | POA: Insufficient documentation

## 2021-12-30 DIAGNOSIS — K6289 Other specified diseases of anus and rectum: Secondary | ICD-10-CM | POA: Insufficient documentation

## 2021-12-30 DIAGNOSIS — K602 Anal fissure, unspecified: Secondary | ICD-10-CM | POA: Insufficient documentation

## 2021-12-30 NOTE — Progress Notes (Unsigned)
[facility] [address] [place] [address] Phone: [phone]   Assessment and Plan:     There are no diagnoses linked to this encounter.  ***   Pertinent previous records reviewed include ***   Follow Up: ***     Subjective:   I, Kelsey Black, am serving as a Neurosurgeon for Doctor Kelsey Black   Chief Complaint: right shoulder pain    HPI:    12/03/2021 Patient is a 31 year old female complaining of right shoulder pain. Patient states that it might be a pressure point when she lays on her right side her hand goes numb, also having rib pain , been going on for a month, no MOI, no meds for the pain , tylenol 800 mg for other things ,   12/31/2021 Patient states    Relevant Historical Information: None pertinent  Additional pertinent review of systems negative.   Current Outpatient Medications:    acetaminophen (TYLENOL) 500 MG tablet, Take 1,000 mg by mouth every 6 (six) hours as needed for mild pain., Disp: , Rfl:    albuterol (VENTOLIN HFA) 108 (90 Base) MCG/ACT inhaler, Inhale 2 puffs into the lungs every 6 (six) hours as needed for wheezing or shortness of breath., Disp: 8 g, Rfl: 0   Diethylpropion HCl CR 75 MG TB24, Take 75 mg by mouth daily., Disp: , Rfl:    docusate sodium (COLACE) 100 MG capsule, Take 1 capsule (100 mg total) by mouth every 12 (twelve) hours., Disp: 60 capsule, Rfl: 0   EPIPEN 2-PAK 0.3 MG/0.3ML SOAJ injection, Inject 0.3 mg into the muscle as needed for anaphylaxis., Disp: , Rfl:    escitalopram (LEXAPRO) 10 MG tablet, Take 0.5 tablets (5 mg total) by mouth daily., Disp: 30 tablet, Rfl: 1   fluticasone (FLONASE) 50 MCG/ACT nasal spray, Place 2 sprays into both nostrils daily for 7 days. (Patient taking differently: Place 2 sprays into both nostrils daily as needed for allergies.), Disp: 1 g, Rfl: 0   hydrOXYzine (VISTARIL) 25 MG capsule, Take 1 capsule (25 mg total) by mouth at bedtime  as needed., Disp: 30 capsule, Rfl: 5   LARIN 24 FE 1-20 MG-MCG(24) tablet, Take 1 tablet by mouth daily., Disp: 28 tablet, Rfl: 5   levocetirizine (XYZAL) 5 MG tablet, Take 5 mg by mouth daily as needed for allergies., Disp: , Rfl:    lidocaine (XYLOCAINE) 5 % ointment, Apply 1 application. topically as needed., Disp: 35.44 g, Rfl: 0   meloxicam (MOBIC) 15 MG tablet, Take 1 tablet (15 mg total) by mouth daily., Disp: 30 tablet, Rfl: 0   NIFEdipine (PROCARDIA) 20 MG capsule, Take 1 capsule (20 mg total) by mouth 2 (two) times daily., Disp: 60 capsule, Rfl: 3   ondansetron (ZOFRAN-ODT) 4 MG disintegrating tablet, Take 1 tablet (4 mg total) by mouth every 6 (six) hours as needed for nausea or vomiting., Disp: 20 tablet, Rfl: 0   polyethylene glycol (MIRALAX / GLYCOLAX) 17 g packet, Take 17 g by mouth daily., Disp: 14 each, Rfl: 0   promethazine (PHENERGAN) 25 MG tablet, Take 25 mg by mouth every 6 (six) hours as needed for nausea/vomiting., Disp: , Rfl:    promethazine-dextromethorphan (PROMETHAZINE-DM) 6.25-15 MG/5ML syrup, Take 5 mLs by mouth 4 (four) times daily as needed for cough., Disp: 118 mL, Rfl: 0   sertraline (ZOLOFT) 50 MG tablet, Take 1 tablet (50 mg total) by mouth daily as needed., Disp: 30 tablet, Rfl:  5   Vitamin D, Ergocalciferol, (DRISDOL) 1.25 MG (50000 UNIT) CAPS capsule, Take 1 capsule by mouth once a week, Disp: 12 capsule, Rfl: 0   Objective:     There were no vitals filed for this visit.    There is no height or weight on file to calculate BMI.    Physical Exam:    ***   Electronically signed by:  Kelsey Black [facility] 7:47 AM 12/30/21

## 2021-12-31 ENCOUNTER — Ambulatory Visit: Payer: Medicaid Other | Admitting: Sports Medicine

## 2021-12-31 VITALS — BP 118/78 | HR 71 | Ht 59.0 in | Wt 192.0 lb

## 2021-12-31 DIAGNOSIS — M9901 Segmental and somatic dysfunction of cervical region: Secondary | ICD-10-CM

## 2021-12-31 DIAGNOSIS — M9902 Segmental and somatic dysfunction of thoracic region: Secondary | ICD-10-CM

## 2021-12-31 DIAGNOSIS — S46811D Strain of other muscles, fascia and tendons at shoulder and upper arm level, right arm, subsequent encounter: Secondary | ICD-10-CM

## 2021-12-31 DIAGNOSIS — S46812D Strain of other muscles, fascia and tendons at shoulder and upper arm level, left arm, subsequent encounter: Secondary | ICD-10-CM | POA: Diagnosis not present

## 2021-12-31 DIAGNOSIS — M9908 Segmental and somatic dysfunction of rib cage: Secondary | ICD-10-CM

## 2021-12-31 NOTE — Patient Instructions (Addendum)
Good to see you  Discontinue meloxicam use remainder as needed  Continue HEP As needed follow up [facility]

## 2022-01-21 LAB — HM PAP SMEAR: HM Pap smear: NEGATIVE

## 2022-01-22 ENCOUNTER — Ambulatory Visit (HOSPITAL_BASED_OUTPATIENT_CLINIC_OR_DEPARTMENT_OTHER): Payer: Medicaid Other | Admitting: Psychiatry

## 2022-01-22 VITALS — BP 115/78 | HR 75

## 2022-01-22 DIAGNOSIS — F330 Major depressive disorder, recurrent, mild: Secondary | ICD-10-CM

## 2022-01-22 NOTE — Progress Notes (Signed)
BH MD/PA/NP OP Progress Note  01/22/2022 9:54 AM Kelsey BryantKatherine Mercado Black  MRN:  [id number]  Chief Complaint:  Chief Complaint  Patient presents with   Follow-up   HPI: Patient is a 31 year old female with a past psychiatric history of MDD and anxiety presented [facility] for follow-up.  Patient reports that she has not started antidepressant yet as she has been feeling well without any medications.  She does not want to start anything which she has to take it all her life.  Discussed that she may not need antidepressant for all her life and may need it for 1 year or so.  She thinks that antidepressants will make her feel sedated and she will not be able to do her daily activities.  Discussed that most antidepressant does not cause sedation but she can take antidepressants at night if she is worried about sedation.  She is still refusing to start antidepressant and asking for therapy resources.  She reports that her energy is good and she is doing what she is supposed to do.  She reports that she gets about 6 hours of sleep at night and reports stable appetite.  She denies SI, HI, AVH.  Visit Diagnosis:    ICD-10-CM   1. MDD (major depressive disorder), recurrent episode, mild (HCC)  F33.0       Past Psychiatric History: Previous Psych Diagnoses: Depression and anxiety.  Had a depressive episode when she was a child but does not remember details. Prior inpatient treatment: Does not remember. Current meds: None Psychotherapy hx: Got some therapy when she was younger due to her trauma. Previous suicidal attempts: Does not remember Previous medication trials: Zoloft when she was younger.  She thinks she tried other medications also which she does not remember much. Current therapist: None  Past Medical History:  Past Medical History:  Diagnosis Date   Anemia    Chorioamnionitis 2019   Black polyps    Depression    Endometriosis    Family history of adverse reaction to  anesthesia    mother woke up during abdominal surgery 3 years ago   Family history of Black cancer 04/04/2021   Nausea & vomiting    last month   Pregnancy    19 weeks and few days michelle horvath ob dr   Symptomatic cholelithiasis     Past Surgical History:  Procedure Laterality Date   CHOLECYSTECTOMY N/A 11/16/2016   Procedure: LAPAROSCOPIC CHOLECYSTECTOMY;  Surgeon: Andria MeuseWhite, Christopher M, MD;  Location: [facility];  Service: General;  Laterality: N/A;   COLONOSCOPY W/ POLYPECTOMY  2023   HEMORRHOID SURGERY N/A 05/13/2021   Procedure: INTERNAL AND EXTERNAL HEMORRHOIDECTOMY;  Surgeon: Violeta Gelinashompson, Burke, MD;  [facility];  Service: General;  Laterality: N/A;   TOOTH EXTRACTION      Family Psychiatric History: Psych: Denies SA/HA: Denies  Family History:  Family History  Problem Relation Age of Onset   Asthma Mother    Asthma Father    Epilepsy Father    Diabetes Father    Asthma Sister    Asthma Brother    Breast cancer Maternal Aunt 54   Diabetes Maternal Grandfather    Hypertension Maternal Grandfather    Black cancer Maternal Grandfather [phone]    Social History:  Social History   Socioeconomic History   Marital status: Single    Spouse name: Not on file   Number of children: Not on file   Years of education: Not on file  Highest education level: Not on file  Occupational History   Not on file  Tobacco Use   Smoking status: Never   Smokeless tobacco: Never  Vaping Use   Vaping Use: Some days  Substance and Sexual Activity   Alcohol use: Yes    Comment: social   Drug use: No   Sexual activity: Yes  Other Topics Concern   Not on file  Social History Narrative   ** Merged History Encounter **       Social Determinants of Health   Financial Resource Strain: Not on file  Food Insecurity: Not on file  Transportation Needs: Not on file  Physical Activity: Not on file  Stress: Not on file  Social Connections: Not on file    Allergies:  Allergies   Allergen Reactions   Penicillins Swelling and Other (See Comments)    Reaction:  Arm/leg swelling  Has patient had a PCN reaction causing immediate rash, facial/tongue/throat swelling, SOB or lightheadedness with hypotension: Yes Has patient had a PCN reaction causing severe rash involving mucus membranes or skin necrosis: No Has patient had a PCN reaction that required hospitalization: No Has patient had a PCN reaction occurring within the last 10 years: No If all of the above answers are "NO", then may proceed with Cephalosporin use.   Shrimp [Shellfish Allergy] Swelling and Other (See Comments)    Localized swelling    Metabolic Disorder Labs: No results found for: "HGBA1C", "MPG" Lab Results  Component Value Date   PROLACTIN 6.7 11/22/2013   No results found for: "CHOL", "TRIG", "HDL", "CHOLHDL", "VLDL", "LDLCALC" Lab Results  Component Value Date   TSH 1.82 11/11/2021   TSH 1.76 05/19/2015    Therapeutic Level Labs: No results found for: "LITHIUM" No results found for: "VALPROATE" No results found for: "CBMZ"  Current Medications: Current Outpatient Medications  Medication Sig Dispense Refill   acetaminophen (TYLENOL) 500 MG tablet Take 1,000 mg by mouth every 6 (six) hours as needed for mild pain.     albuterol (VENTOLIN HFA) 108 (90 Base) MCG/ACT inhaler Inhale 2 puffs into the lungs every 6 (six) hours as needed for wheezing or shortness of breath. 8 g 0   Diethylpropion HCl CR 75 MG TB24 Take 75 mg by mouth daily.     docusate sodium (COLACE) 100 MG capsule Take 1 capsule (100 mg total) by mouth every 12 (twelve) hours. 60 capsule 0   EPIPEN 2-PAK 0.3 MG/0.3ML SOAJ injection Inject 0.3 mg into the muscle as needed for anaphylaxis.     escitalopram (LEXAPRO) 10 MG tablet Take 0.5 tablets (5 mg total) by mouth daily. 30 tablet 1   fluticasone (FLONASE) 50 MCG/ACT nasal spray Place 2 sprays into both nostrils daily for 7 days. (Patient taking differently: Place 2 sprays  into both nostrils daily as needed for allergies.) 1 g 0   hydrOXYzine (VISTARIL) 25 MG capsule Take 1 capsule (25 mg total) by mouth at bedtime as needed. 30 capsule 5   LARIN 24 FE 1-20 MG-MCG(24) tablet Take 1 tablet by mouth daily. 28 tablet 5   levocetirizine (XYZAL) 5 MG tablet Take 5 mg by mouth daily as needed for allergies.     lidocaine (XYLOCAINE) 5 % ointment Apply 1 application. topically as needed. 35.44 g 0   meloxicam (MOBIC) 15 MG tablet Take 1 tablet (15 mg total) by mouth daily. 30 tablet 0   NIFEdipine (PROCARDIA) 20 MG capsule Take 1 capsule (20 mg total) by mouth 2 (two)  times daily. 60 capsule 3   ondansetron (ZOFRAN-ODT) 4 MG disintegrating tablet Take 1 tablet (4 mg total) by mouth every 6 (six) hours as needed for nausea or vomiting. 20 tablet 0   polyethylene glycol (MIRALAX / GLYCOLAX) 17 g packet Take 17 g by mouth daily. 14 each 0   promethazine (PHENERGAN) 25 MG tablet Take 25 mg by mouth every 6 (six) hours as needed for nausea/vomiting.     promethazine-dextromethorphan (PROMETHAZINE-DM) 6.25-15 MG/5ML syrup Take 5 mLs by mouth 4 (four) times daily as needed for cough. 118 mL 0   sertraline (ZOLOFT) 50 MG tablet Take 1 tablet (50 mg total) by mouth daily as needed. 30 tablet 5   Vitamin D, Ergocalciferol, (DRISDOL) 1.25 MG (50000 UNIT) CAPS capsule Take 1 capsule by mouth once a week 12 capsule 0   No current facility-administered medications for this visit.     Musculoskeletal: Strength & Muscle Tone: within normal limits Gait & Station: normal Patient leans: N/A  Psychiatric Specialty Exam: Review of Systems  Blood pressure 115/78, pulse 75, SpO2 99 %.There is no height or weight on file to calculate BMI.  General Appearance: Casual  Eye Contact:  Good  Speech:  Clear and Coherent and Normal Rate  Volume:  Normal  Mood:  Euthymic  Affect:  Constricted  Thought Process:  Coherent and Linear  Orientation:  Full (Time, Place, and Person)  Thought  Content: Logical   Suicidal Thoughts:  No  Homicidal Thoughts:  No  Memory:  Immediate;   Good Recent;   Good  Judgement:  Fair  Insight:  Fair  Psychomotor Activity:  Normal  Concentration:  Concentration: Good and Attention Span: Good  Recall:  Good  Fund of Knowledge: Good  Language: Good  Akathisia:  No  Handed:  Right  AIMS (if indicated): not done  Assets:  Communication Skills Desire for Improvement Financial Resources/Insurance Housing Intimacy Social Support Vocational/Educational  ADL's:  Intact  Cognition: WNL  Sleep:  Fair   Screenings: GAD-7    Flowsheet Row [facility]  Total GAD-7 Score 17      PHQ2-9    Flowsheet Row Office Visit from 12/25/2021 in [facility] Office Visit from 10/02/2021 in [facility] Office Visit from 05/26/2015 in [facility] at [place] Office Visit from 05/19/2015 in Primary Care at [facility] Total Score 2 0 0 0  PHQ-9 Total Score 13 -- -- --      Flowsheet Row ED from 07/26/2021 in [facility] EMERGENCY DEPARTMENT ED from 05/20/2021 in [facility] EMERGENCY DEPARTMENT ED from 05/17/2021 in [facility] EMERGENCY DEPARTMENT  C-SSRS RISK CATEGORY No Risk No Risk No Risk        Assessment and Plan:  Patient is a 31 year old female with a past psychiatric history of depression and anxiety presented to [facility] outpatient clinic for follow up.  Patient has not started Lexapro yet and is not sure if she would start it.  She reports that she is doing well without antidepressants and is not interested in starting any antidepressants at this time. Recommended to start therapy.  Therapy resources provided. Denies SI, HI, AVH. She has been taking vitamin D supplement but has not started iron supplement yet.  She will talk to her PCP to start iron supplement.    MDD, recurrent, moderate episode PTSD -She was prescribed Lexapro 5 mg daily  at her last visit but she hasn't started it yet and doesn't want to start antidepressants at this time.   - Recommend starting therapy. Therapy resources provided.   Vitamin D deficiency -Continue vitamin D3 OTC 1000 mg orally daily.    Iron deficiency anemia -Patient was recommended to take iron supplement but she has not been taking it currently. -Follow-up with PCP.   Follow-up in 2 months or as needed.   Collaboration of Care: Collaboration of Care: None  Patient/Guardian was advised Release of Information must be obtained prior to any record release in order to collaborate their care with an outside provider. Patient/Guardian was advised if they have not already done so to contact the registration department to sign all necessary forms in order for [place] to release information regarding their care.   Consent: Patient/Guardian gives verbal consent for treatment and assignment of benefits for services provided during this visit. Patient/Guardian expressed understanding and agreed to proceed.    Karsten Ro, MD 01/22/2022, 9:54 AM

## 2022-01-26 ENCOUNTER — Encounter (HOSPITAL_COMMUNITY): Payer: Self-pay | Admitting: Psychiatry

## 2022-01-28 ENCOUNTER — Other Ambulatory Visit: Payer: Self-pay

## 2022-02-04 ENCOUNTER — Encounter (HOSPITAL_COMMUNITY): Payer: Self-pay

## 2022-02-04 ENCOUNTER — Ambulatory Visit (HOSPITAL_COMMUNITY)
Admission: EM | Admit: 2022-02-04 | Discharge: 2022-02-04 | Disposition: A | Discharge status: Home / Self Care | Payer: Medicaid Other | Attending: Nurse Practitioner | Admitting: Nurse Practitioner

## 2022-02-04 ENCOUNTER — Ambulatory Visit (INDEPENDENT_AMBULATORY_CARE_PROVIDER_SITE_OTHER): Payer: Medicaid Other

## 2022-02-04 DIAGNOSIS — K602 Anal fissure, unspecified: Secondary | ICD-10-CM

## 2022-02-04 NOTE — ED Provider Notes (Signed)
[place]    CSN: [id number] Arrival date & time: 02/04/22  1103      History   Chief Complaint Chief Complaint  Patient presents with   Rectal Pain    HPI Kelsey Black is a 31 y.o. female.   HPI  She is in today for evaluation of rectal pain. She was sent here by her specialist because she is not available until Jan. She has a significant GI history . She is sp a recent colonoscopy and hemorrhoidectomy; 03/23. She reports that since her surgery she has had pain. She had 2 internal and 2 external hemorrhoids. She does not fell like the surgery was successful. She has bowel movement but describes them as being painful and string like. She is taking Miralax daily . She was prescribed Diltiazem 2% gel for her rectal pain. She does not feel like this is effective. She feels like she has small cuts to her rectal area. She has been diagnosed with anal fissures in the past. She reports that her life is tough because she has a 31 year old nonverbal autism daughter.  Past Medical History:  Diagnosis Date   Anemia    Chorioamnionitis 2019   Black polyps    Depression    Endometriosis    Family history of adverse reaction to anesthesia    mother woke up during abdominal surgery 3 years ago   Family history of Black cancer 04/04/2021   Nausea & vomiting    last month   Pregnancy    19 weeks and few days michelle horvath ob dr   Symptomatic cholelithiasis     Patient Active Problem List   Diagnosis Date Noted   Anal fissure 12/30/2021   Rectal bleeding 12/30/2021   Rectal pain 12/30/2021   Genetic testing 04/22/2021   Family history of breast cancer 04/04/2021   Family history of Black cancer 04/04/2021   Dysmenorrhea 03/18/2021   Chorioamnionitis 03/24/2017   Indication for care in labor or delivery 03/22/2017   Chronic cholecystitis 11/16/2016   Gallstone 11/06/2016   Annual physical exam 05/27/2016   Bacterial vaginosis 08/01/2014    Past  Surgical History:  Procedure Laterality Date   CHOLECYSTECTOMY N/A 11/16/2016   Procedure: LAPAROSCOPIC CHOLECYSTECTOMY;  Surgeon: Ileana Roup, MD;  Location: [facility];  Service: General;  Laterality: N/A;   COLONOSCOPY W/ POLYPECTOMY  2023   HEMORRHOID SURGERY N/A 05/13/2021   Procedure: INTERNAL AND EXTERNAL HEMORRHOIDECTOMY;  Surgeon: Georganna Skeans, MD;  Location: [place];  Service: General;  Laterality: N/A;   TOOTH EXTRACTION      OB History     Gravida  1   Para  1   Term  1   Preterm  0   AB  0   Living  1      SAB  0   IAB  0   Ectopic  0   Multiple  0   Live Births  1            Home Medications    Prior to Admission medications   Medication Sig Start Date End Date Taking? Authorizing Provider  acetaminophen (TYLENOL) 500 MG tablet Take 1,000 mg by mouth every 6 (six) hours as needed for mild pain.    [provider]  albuterol (VENTOLIN HFA) 108 (90 Base) MCG/ACT inhaler Inhale 2 puffs into the lungs every 6 (six) hours as needed for wheezing or shortness of breath. 09/19/21   Allwardt, Randa Evens, PA-C  Diethylpropion HCl CR 75 MG TB24 Take 75 mg by mouth daily. 02/27/21   [provider]  diltiazem 2 % GEL Apply 1 Application topically 2 (two) times daily. 12/30/21   [provider]  docusate sodium (COLACE) 100 MG capsule Take 1 capsule (100 mg total) by mouth every 12 (twelve) hours. 07/26/21   Dartha Lodge, PA-C  EPIPEN 2-PAK 0.3 MG/0.3ML SOAJ injection Inject 0.3 mg into the muscle as needed for anaphylaxis. 03/10/21   [provider]  escitalopram (LEXAPRO) 10 MG tablet Take 0.5 tablets (5 mg total) by mouth daily. 12/25/21   Karsten Ro, MD  fluticasone (FLONASE) 50 MCG/ACT nasal spray Place 2 sprays into both nostrils daily for 7 days. Patient taking differently: Place 2 sprays into both nostrils daily as needed for allergies. 01/14/21 09/02/21  Long, Arlyss Repress, MD  fluticasone (FLONASE) 50 MCG/ACT nasal  spray Place 1 spray into both nostrils daily. 01/14/21   [provider]  hydrOXYzine (VISTARIL) 25 MG capsule Take 1 capsule (25 mg total) by mouth at bedtime as needed. 09/02/21   Allwardt, Crist Infante, PA-C  ibuprofen (ADVIL) 800 MG tablet Take 800 mg by mouth every 6 (six) hours as needed for moderate pain. 01/21/22   [provider]  LARIN 24 FE 1-20 MG-MCG(24) tablet Take 1 tablet by mouth daily. 09/02/21   Allwardt, Crist Infante, PA-C  levocetirizine (XYZAL) 5 MG tablet Take 5 mg by mouth daily as needed for allergies.    [provider]  lidocaine (XYLOCAINE) 5 % ointment Apply 1 application. topically as needed. 05/17/21   Gerhard Munch, MD  meloxicam (MOBIC) 15 MG tablet Take 1 tablet (15 mg total) by mouth daily. 12/03/21   Richardean Sale, DO  NIFEdipine (PROCARDIA) 20 MG capsule Take 1 capsule (20 mg total) by mouth 2 (two) times daily. 11/24/21   Tressia Danas, MD  ondansetron (ZOFRAN-ODT) 4 MG disintegrating tablet Take 1 tablet (4 mg total) by mouth every 6 (six) hours as needed for nausea or vomiting. 07/26/21   Dartha Lodge, PA-C  polyethylene glycol (MIRALAX / GLYCOLAX) 17 g packet Take 17 g by mouth daily. 05/17/21   Gerhard Munch, MD  promethazine (PHENERGAN) 25 MG tablet Take 25 mg by mouth every 6 (six) hours as needed for nausea/vomiting. 03/20/21   [provider]  promethazine-dextromethorphan (PROMETHAZINE-DM) 6.25-15 MG/5ML syrup Take 5 mLs by mouth 4 (four) times daily as needed for cough. 05/09/21   Daphine Deutscher, Mary-Margaret, FNP  sertraline (ZOLOFT) 50 MG tablet Take 1 tablet (50 mg total) by mouth daily as needed. 09/02/21   Allwardt, Crist Infante, PA-C  Vitamin D, Ergocalciferol, (DRISDOL) 1.25 MG (50000 UNIT) CAPS capsule Take 1 capsule by mouth once a week 11/24/21   Allwardt, Alyssa M, PA-C  famotidine (PEPCID) 20 MG tablet Take 1 tablet (20 mg total) by mouth 2 (two) times daily. Patient not taking: Reported on 11/02/2017 11/06/16 04/24/19   Leftwich-Kirby, Wilmer Floor, CNM    Family History Family History  Problem Relation Age of Onset   Asthma Mother    Asthma Father    Epilepsy Father    Diabetes Father    Asthma Sister    Asthma Brother    Breast cancer Maternal Aunt 69   Diabetes Maternal Grandfather    Hypertension Maternal Grandfather    Black cancer Maternal Grandfather 63    Social History Social History   Tobacco Use   Smoking status: Never   Smokeless tobacco: Never  Vaping Use  Vaping Use: Some days  Substance Use Topics   Alcohol use: Yes    Comment: social   Drug use: No     Allergies   Penicillins, Shellfish allergy, and Shellfish-derived products   Review of Systems Review of Systems   Physical Exam Triage Vital Signs ED Triage Vitals [02/04/22 1439]  Enc Vitals Group     BP 128/73     Pulse Rate 73     Resp 16     Temp 98.4 F (36.9 C)     Temp Source Oral     SpO2 97 %     Weight      Height      Head Circumference      Peak Flow      Pain Score 8     Pain Loc      Pain Edu?      Excl. in [place]?    No data found.  Updated Vital Signs BP 128/73 (BP Location: Right Arm)   Pulse 73   Temp 98.4 F (36.9 C) (Oral)   Resp 16   LMP 01/30/2022 (Approximate)   SpO2 97%   Visual Acuity Right Eye Distance:   Left Eye Distance:   Bilateral Distance:    Right Eye Near:   Left Eye Near:    Bilateral Near:     Physical Exam Constitutional:      General: She is not in acute distress. HENT:     Head: Normocephalic.  Cardiovascular:     Rate and Rhythm: Normal rate.  Pulmonary:     Effort: Pulmonary effort is normal.  Genitourinary:    Comments: Desitin to rectum unable to visualize any tears. Patient unable to have cream removed due to the pain. No active bleeding noted to the skin Skin:    General: Skin is warm.  Neurological:     Mental Status: She is alert.      UC Treatments / Results  Labs (all labs ordered are listed, but only abnormal results are  displayed) Labs Reviewed - No data to display  EKG   Radiology DG Abd 2 Views  Result Date: 02/04/2022 CLINICAL DATA:  Rectal pain EXAM: ABDOMEN - 2 VIEW COMPARISON:  CT 07/26/2021 FINDINGS: The bowel gas pattern is normal. There is no evidence of free air. No radio-opaque calculi or other significant radiographic abnormality is seen. Nonspecific scattered fluid levels overlying the Black. IMPRESSION: Negative. Electronically Signed   By: Donavan Foil M.D.   On: 02/04/2022 15:27    Procedures Procedures (including critical care time)  Medications Ordered in UC Medications - No data to display  Initial Impression / Assessment and Plan / UC Course  I have reviewed the triage vital signs and the nursing notes.  Pertinent labs & imaging results that were available during my care of the patient were reviewed by me and considered in my medical decision making (see chart for details).     Rectal pain  Final Clinical Impressions(s) / UC Diagnoses   Final diagnoses:  Anal fissure     Discharge Instructions      You have anal fissure. Your xray is negative for constipation or any other acute abnormalities. You will have to continue to treat your symptoms with the Desitin and Diltiazem per recommendations.      ED Prescriptions   None    PDMP not reviewed this encounter.   Dionisio David Reynolds, [place] 02/04/22 [phone]

## 2022-02-04 NOTE — ED Notes (Signed)
At bedside for Kelsey Black , NP exam of patient

## 2022-02-04 NOTE — ED Triage Notes (Signed)
Pt states she had a hemorrhoidectomy in March states she had been having rectal pain ever since worse in the past 2 weeks. Was given diltiazem cream but states it burns when she uses it.

## 2022-02-04 NOTE — Discharge Instructions (Addendum)
You have anal fissure. Your xray is negative for constipation or any other acute abnormalities. You will have to continue to treat your symptoms with the Desitin and Diltiazem per recommendations.

## 2022-02-19 ENCOUNTER — Encounter (INDEPENDENT_AMBULATORY_CARE_PROVIDER_SITE_OTHER): Payer: PRIVATE HEALTH INSURANCE | Admitting: Internal Medicine

## 2022-02-26 NOTE — Progress Notes (Signed)
Obtained results from [facility]  Colonoscopy 1/23/202 for colon cancer screening with a family history of immediate family members with colon cancer revealed a 2 mm polyp in the rectum and small internal hemorrhoids.

## 2022-03-05 ENCOUNTER — Encounter: Payer: Medicaid Other | Admitting: Physician Assistant

## 2022-03-12 ENCOUNTER — Encounter: Payer: Self-pay | Admitting: Physician Assistant

## 2022-03-12 ENCOUNTER — Telehealth: Payer: Self-pay | Admitting: Physician Assistant

## 2022-03-12 ENCOUNTER — Ambulatory Visit (INDEPENDENT_AMBULATORY_CARE_PROVIDER_SITE_OTHER): Payer: Medicaid Other | Admitting: Physician Assistant

## 2022-03-12 VITALS — BP 110/74 | HR 79 | Temp 98.0°F | Ht 59.0 in | Wt 189.4 lb

## 2022-03-12 DIAGNOSIS — D508 Other iron deficiency anemias: Secondary | ICD-10-CM | POA: Diagnosis not present

## 2022-03-12 DIAGNOSIS — Z0000 Encounter for general adult medical examination without abnormal findings: Secondary | ICD-10-CM

## 2022-03-12 DIAGNOSIS — D649 Anemia, unspecified: Secondary | ICD-10-CM | POA: Insufficient documentation

## 2022-03-12 DIAGNOSIS — M79674 Pain in right toe(s): Secondary | ICD-10-CM

## 2022-03-12 DIAGNOSIS — F419 Anxiety disorder, unspecified: Secondary | ICD-10-CM | POA: Diagnosis not present

## 2022-03-12 DIAGNOSIS — E559 Vitamin D deficiency, unspecified: Secondary | ICD-10-CM | POA: Diagnosis not present

## 2022-03-12 DIAGNOSIS — F32A Depression, unspecified: Secondary | ICD-10-CM | POA: Insufficient documentation

## 2022-03-12 HISTORY — DX: Anxiety disorder, unspecified: F41.9

## 2022-03-12 LAB — CBC WITH DIFFERENTIAL/PLATELET
Basophils Absolute: 0 10*3/uL (ref 0.0–0.1)
Basophils Relative: 0.6 % (ref 0.0–3.0)
Eosinophils Absolute: 0.2 10*3/uL (ref 0.0–0.7)
Eosinophils Relative: 3.6 % (ref 0.0–5.0)
HCT: 37 % (ref 36.0–46.0)
Hemoglobin: 11.9 g/dL — ABNORMAL LOW (ref 12.0–15.0)
Lymphocytes Relative: 31.5 % (ref 12.0–46.0)
Lymphs Abs: 1.8 10*3/uL (ref 0.7–4.0)
MCHC: 32.2 g/dL (ref 30.0–36.0)
MCV: 85 fl (ref 78.0–100.0)
Monocytes Absolute: 0.3 10*3/uL (ref 0.1–1.0)
Monocytes Relative: 5.1 % (ref 3.0–12.0)
Neutro Abs: 3.5 10*3/uL (ref 1.4–7.7)
Neutrophils Relative %: 59.2 % (ref 43.0–77.0)
Platelets: 359 10*3/uL (ref 150.0–400.0)
RBC: 4.36 Mil/uL (ref 3.87–5.11)
RDW: 15.5 % (ref 11.5–15.5)
WBC: 5.9 10*3/uL (ref 4.0–10.5)

## 2022-03-12 LAB — COMPREHENSIVE METABOLIC PANEL
ALT: 14 U/L (ref 0–35)
AST: 13 U/L (ref 0–37)
Albumin: 4 g/dL (ref 3.5–5.2)
Alkaline Phosphatase: 62 U/L (ref 39–117)
BUN: 10 mg/dL (ref 6–23)
CO2: 27 mEq/L (ref 19–32)
Calcium: 9 mg/dL (ref 8.4–10.5)
Chloride: 105 mEq/L (ref 96–112)
Creatinine, Ser: 0.64 mg/dL (ref 0.40–1.20)
GFR: 118.05 mL/min (ref >60.00)
Glucose, Bld: 88 mg/dL (ref 70–99)
Potassium: 4.6 mEq/L (ref 3.5–5.1)
Sodium: 138 mEq/L (ref 135–145)
Total Bilirubin: 0.5 mg/dL (ref 0.2–1.2)
Total Protein: 7.1 g/dL (ref 6.0–8.3)

## 2022-03-12 LAB — LIPID PANEL
Cholesterol: 135 mg/dL (ref 0–200)
HDL: 44.2 mg/dL (ref >39.00)
LDL Cholesterol: 75 mg/dL (ref 0–99)
NonHDL: 90.66
Total CHOL/HDL Ratio: 3
Triglycerides: 80 mg/dL (ref 0.0–149.0)
VLDL: 16 mg/dL (ref 0.0–40.0)

## 2022-03-12 LAB — VITAMIN D 25 HYDROXY (VIT D DEFICIENCY, FRACTURES): VITD: 15.75 ng/mL — ABNORMAL LOW (ref 30.00–100.00)

## 2022-03-12 LAB — TSH: TSH: 2 u[IU]/mL (ref 0.35–5.50)

## 2022-03-12 MED ORDER — POLYETHYLENE GLYCOL 3350 17 G PO PACK: 17.0000 g | PACK | Freq: Every day | ORAL | 2 refills | Status: DC

## 2022-03-12 NOTE — Telephone Encounter (Signed)
Patient states she needs a letter proving she was here so she can show her daughter's school.

## 2022-03-12 NOTE — Telephone Encounter (Signed)
Returned pt call and lvm asking her to return call or send Mychart msg letting me know if she would like this letter printed to pick up or just available through [place]

## 2022-03-12 NOTE — Assessment & Plan Note (Signed)
Age-appropriate screening and counseling performed today. Will check labs and call with results. Preventive measures discussed and printed in AVS for patient.   Patient Counseling: [x]$   Nutrition: Stressed importance of moderation in sodium/caffeine intake, saturated fat and cholesterol, caloric balance, sufficient intake of fresh fruits, vegetables, and fiber.  [x]$   Stressed the importance of regular exercise.   [x]$   Substance Abuse: Discussed cessation/primary prevention of tobacco, alcohol, or other drug use; driving or other dangerous activities under the influence; availability of treatment for abuse.   []$   Injury prevention: Discussed safety belts, safety helmets, smoke detector, smoking near bedding or upholstery.   []$   Sexuality: Discussed sexually transmitted diseases, partner selection, use of condoms, avoidance of unintended pregnancy  and contraceptive alternatives.   [x]$   Dental health: Discussed importance of regular tooth brushing, flossing, and dental visits.  [x]$   Health maintenance and immunizations reviewed. Please refer to Health maintenance section.

## 2022-03-12 NOTE — Assessment & Plan Note (Signed)
She has seen psychiatry / psychology, states she 'chewed the last one out', doesn't have time to try different medications, stopped all medications herself.

## 2022-03-12 NOTE — Progress Notes (Signed)
Subjective:    Patient ID: Kelsey Black, female    DOB: 1990-11-30, 32 y.o.   MRN: [id number]  Chief Complaint  Patient presents with   Annual Exam    Pt in the office for annual CPE and fasting labs; pt thinking she may need to see at podiatrist due to left foot has hole on bottom of foot and right foot has hole in great toe nail and bothersome when walking at times; pt getting MRI for abdominal issues and scheduled to see GYN again soon. Yearly Pap completed     HPI Patient is in today for annual exam. Here with daughter, Gladys Damme, who has hx of severe autism.   Acute concerns: Toenail concerns, requesting to see podiatry  Health maintenance: Lifestyle/ exercise: Not much exercise Nutrition: Cooks all meals  Mental health: Stable  Caffeine: Minimal coffee  Sleep: Averages 4 hours per night  Substance use: None Sexual activity: Monogamous with husband  Immunizations: UTD Pap: Normal, done with GYN     Past Medical History:  Diagnosis Date   Allergy    Anemia    Anxiety and depression 03/12/2022   Chorioamnionitis 2019   Clotting disorder (HCC) 08/2020   Black polyps    Depression    Endometriosis    Family history of adverse reaction to anesthesia    mother woke up during abdominal surgery 3 years ago   Family history of Black cancer 04/04/2021   Nausea & vomiting    last month   Pregnancy    19 weeks and few days michelle horvath ob dr   Symptomatic cholelithiasis     Past Surgical History:  Procedure Laterality Date   CHOLECYSTECTOMY N/A 11/16/2016   Procedure: LAPAROSCOPIC CHOLECYSTECTOMY;  Surgeon: Andria Meuse, MD;  Location: [facility];  Service: General;  Laterality: N/A;   COLONOSCOPY W/ POLYPECTOMY  2023   EYE SURGERY  2022   HEMORRHOID SURGERY N/A 05/13/2021   Procedure: INTERNAL AND EXTERNAL HEMORRHOIDECTOMY;  Surgeon: Violeta Gelinas, MD;  Location: [facility] OR;  Service: General;  Laterality: N/A;   TOOTH EXTRACTION      Family  History  Problem Relation Age of Onset   Asthma Mother    Asthma Father    Epilepsy Father    Diabetes Father    Asthma Sister    Asthma Sister    Asthma Brother    Diabetes Maternal Grandfather    Hypertension Maternal Grandfather    Black cancer Maternal Grandfather 75   Cancer Maternal Grandfather    Breast cancer Paternal Grandfather    Learning disabilities Daughter    Breast cancer Maternal Aunt 32   Cancer Maternal Aunt    Early death Maternal Aunt     Social History   Tobacco Use   Smoking status: Never   Smokeless tobacco: Never  Vaping Use   Vaping Use: Some days  Substance Use Topics   Alcohol use: Yes    Comment: social   Drug use: No     Allergies  Allergen Reactions   Penicillins Other (See Comments), Swelling, Rash and Itching    Reaction:  Arm/leg swelling   Has patient had a PCN reaction causing immediate rash, facial/tongue/throat swelling, SOB or lightheadedness with hypotension: Yes  Has patient had a PCN reaction causing severe rash involving mucus membranes or skin necrosis: No  Has patient had a PCN reaction that required hospitalization: No  Has patient had a PCN reaction occurring within the last 10 years: No  If all of the above answers are "NO", then may proceed with Cephalosporin use.  Reaction:  Arm/leg swelling  Has patient had a PCN reaction causing immediate rash, facial/tongue/throat swelling, SOB or lightheadedness with hypotension: Yes Has patient had a PCN reaction causing severe rash involving mucus membranes or skin necrosis: No Has patient had a PCN reaction that required hospitalization: No Has patient had a PCN reaction occurring within the last 10 years: No If all of the above answers are "NO", then may proceed with Cephalosporin use.    Leg went numb when younger.    Leg went numb when younger. Reaction:  Arm/leg swelling  Has patient had a PCN reaction causing immediate rash, facial/tongue/throat swelling, SOB or  lightheadedness with hypotension: Yes Has patient had a PCN reaction causing severe rash involving mucus membranes or skin necrosis: No Has patient had a PCN reaction that required hospitalization: No Has patient had a PCN reaction occurring within the last 10 years: No If all of the above answers are "NO", then may proceed with Cephalosporin use.   Shellfish Allergy Other (See Comments), Swelling and Itching    Localized swelling   Shellfish-Derived Products     Other Reaction(s): edema    Review of Systems NEGATIVE UNLESS OTHERWISE INDICATED IN HPI      Objective:     BP 110/74 (BP Location: Left Arm)   Pulse 79   Temp 98 F (36.7 C) (Temporal)   Ht 4\' 11"  (1.499 m)   Wt 189 lb 6.4 oz (85.9 kg)   LMP 02/24/2022 (Exact Date)   SpO2 99%   BMI 38.25 kg/m   Wt Readings from Last 3 Encounters:  03/12/22 189 lb 6.4 oz (85.9 kg)  12/31/21 192 lb (87.1 kg)  12/03/21 190 lb (86.2 kg)    BP Readings from Last 3 Encounters:  03/12/22 110/74  02/04/22 128/73  12/31/21 118/78     Physical Exam Vitals and nursing note reviewed.  Constitutional:      Appearance: Normal appearance. She is normal weight. She is not toxic-appearing.  HENT:     Head: Normocephalic and atraumatic.     Right Ear: Tympanic membrane, ear canal and external ear normal.     Left Ear: Tympanic membrane, ear canal and external ear normal.     Nose: Nose normal.     Mouth/Throat:     Mouth: Mucous membranes are moist.  Eyes:     Extraocular Movements: Extraocular movements intact.     Conjunctiva/sclera: Conjunctivae normal.     Pupils: Pupils are equal, round, and reactive to light.  Cardiovascular:     Rate and Rhythm: Normal rate and regular rhythm.     Pulses: Normal pulses.     Heart sounds: Normal heart sounds.  Pulmonary:     Effort: Pulmonary effort is normal.     Breath sounds: Normal breath sounds.  Musculoskeletal:        General: Normal range of motion.     Cervical back: Normal range  of motion and neck supple.  Skin:    General: Skin is warm and dry.  Neurological:     General: No focal deficit present.     Mental Status: She is alert and oriented to person, place, and time.  Psychiatric:        Mood and Affect: Mood normal.        Behavior: Behavior normal.        Assessment & Plan:  Encounter for annual physical exam  Assessment & Plan: Age-appropriate screening and counseling performed today. Will check labs and call with results. Preventive measures discussed and printed in AVS for patient.   Patient Counseling: [x]   Nutrition: Stressed importance of moderation in sodium/caffeine intake, saturated fat and cholesterol, caloric balance, sufficient intake of fresh fruits, vegetables, and fiber.  [x]   Stressed the importance of regular exercise.   [x]   Substance Abuse: Discussed cessation/primary prevention of tobacco, alcohol, or other drug use; driving or other dangerous activities under the influence; availability of treatment for abuse.   []   Injury prevention: Discussed safety belts, safety helmets, smoke detector, smoking near bedding or upholstery.   []   Sexuality: Discussed sexually transmitted diseases, partner selection, use of condoms, avoidance of unintended pregnancy  and contraceptive alternatives.   [x]   Dental health: Discussed importance of regular tooth brushing, flossing, and dental visits.  [x]   Health maintenance and immunizations reviewed. Please refer to Health maintenance section.      Orders: -     CBC with Differential/Platelet -     Comprehensive metabolic panel -     Lipid panel -     TSH  Anxiety and depression Assessment & Plan: She has seen psychiatry / psychology, states she 'chewed the last one out', doesn't have time to try different medications, stopped all medications herself.    Vitamin D deficiency Assessment & Plan: Recheck levels today   Orders: -     VITAMIN D 25 Hydroxy (Vit-D Deficiency, Fractures)  Other  iron deficiency anemia -     Iron, TIBC and Ferritin Panel  Great toe pain, right -     Ambulatory referral to Podiatry  Other orders -     Polyethylene Glycol 3350; Take 17 g by mouth daily.  Dispense: 14 each; Refill: 2        Return in about 1 year (around 03/13/2023) for CPE, fasting labs .  This note was prepared with assistance of Systems analyst. Occasional wrong-word or sound-a-like substitutions may have occurred due to the inherent limitations of voice recognition software.    Annjeanette Sarwar M Zavon Hyson, PA-C

## 2022-03-12 NOTE — Assessment & Plan Note (Signed)
Recheck levels today

## 2022-03-13 ENCOUNTER — Other Ambulatory Visit: Payer: Self-pay

## 2022-03-13 LAB — IRON,TIBC AND FERRITIN PANEL
%SAT: 25 % (calc) (ref 16–45)
Ferritin: 7 ng/mL — ABNORMAL LOW (ref 16–154)
Iron: 89 ug/dL (ref 40–190)
TIBC: 352 mcg/dL (calc) (ref 250–450)

## 2022-03-13 MED ORDER — VITAMIN D (ERGOCALCIFEROL) 1.25 MG (50000 UNIT) PO CAPS: 50000.0000 [IU] | ORAL_CAPSULE | ORAL | 0 refills | Status: DC

## 2022-03-13 NOTE — Telephone Encounter (Signed)
I apologize In have no way to email anything out but I can print this for you if you like and you can come by the office to pick up if you like. Thanks  Philipp Ovens, CMA

## 2022-03-18 ENCOUNTER — Encounter: Payer: Self-pay | Admitting: Podiatry

## 2022-03-18 ENCOUNTER — Ambulatory Visit: Payer: Medicaid Other | Admitting: Podiatry

## 2022-03-18 DIAGNOSIS — B070 Plantar wart: Secondary | ICD-10-CM

## 2022-03-18 DIAGNOSIS — L600 Ingrowing nail: Secondary | ICD-10-CM

## 2022-03-18 MED ORDER — FLUOROURACIL 5 % EX CREA: TOPICAL_CREAM | Freq: Two times a day (BID) | CUTANEOUS | 2 refills | Status: DC

## 2022-03-18 NOTE — Progress Notes (Signed)
Subjective:   Patient ID: Kelsey Black, female   DOB: 32 y.o.   MRN: [id number]   HPI Patient presents with a lot of pain on the left plantar heel stating there is been a lesion there that is been there for a while and concerned about some discoloration right big toenail.  Patient does not smoke tries to be active   Review of Systems  All other systems reviewed and are negative.       Objective:  Physical Exam Vitals and nursing note reviewed.  Constitutional:      Appearance: She is well-developed.  Pulmonary:     Effort: Pulmonary effort is normal.  Musculoskeletal:        General: Normal range of motion.  Skin:    General: Skin is warm.  Neurological:     Mental Status: She is alert.     Neurovascular status intact muscle strength was found to be adequate range of motion adequate with patient's left plantar heel found to be keratotic and upon debridement shows pinpoint bleeding pain to lateral pressure and a slight change in the right hallux lateral border nail bed localized.  Good digital perfusion well-oriented x 3     Assessment:  For verruca plantaris plantar left versus foreign body with probable nail trauma right     Plan:  H&P reviewed both conditions and that nail right should grow out and for the left did sterile sharp debridement and then went ahead and applied chemical agent to create immune response with sterile dressing.  Gave instructions on what to do if blistering were to occur and will start home Efudex and will be seen back as needed

## 2022-03-25 ENCOUNTER — Encounter (HOSPITAL_COMMUNITY): Payer: Self-pay

## 2022-03-25 ENCOUNTER — Ambulatory Visit (HOSPITAL_COMMUNITY)
Admission: EM | Admit: 2022-03-25 | Discharge: 2022-03-25 | Disposition: A | Discharge status: Home / Self Care | Payer: Medicaid Other | Attending: Emergency Medicine | Admitting: Emergency Medicine

## 2022-03-25 DIAGNOSIS — J069 Acute upper respiratory infection, unspecified: Secondary | ICD-10-CM | POA: Diagnosis not present

## 2022-03-25 MED ORDER — PREDNISONE 20 MG PO TABS: 40.0000 mg | ORAL_TABLET | Freq: Every day | ORAL | 0 refills | Status: DC

## 2022-03-25 MED ORDER — BENZONATATE 100 MG PO CAPS: 100.0000 mg | ORAL_CAPSULE | Freq: Three times a day (TID) | ORAL | 0 refills | Status: DC

## 2022-03-25 MED ORDER — AZITHROMYCIN 250 MG PO TABS: 250.0000 mg | ORAL_TABLET | Freq: Every day | ORAL | 0 refills | Status: DC

## 2022-03-25 MED ORDER — PROMETHAZINE-DM 6.25-15 MG/5ML PO SYRP: 2.5000 mL | ORAL_SOLUTION | Freq: Four times a day (QID) | ORAL | 0 refills | Status: DC | PRN

## 2022-03-25 NOTE — ED Provider Notes (Signed)
[place]    CSN: [id number] Arrival date & time: 03/25/22  1047      History   Chief Complaint Chief Complaint  Patient presents with   Cough   Shortness of Breath    HPI Kelsey Black is a 32 y.o. female.   Patient presents for evaluation of nasal congestion, sore throat, cough, shortness of breath and wheezing present for 2 weeks.  Shortness of breath is only experienced after episodes of coughing.  Wheezing is occurring predominantly at nighttime.  Congestion is mainly present at nighttime, improves throughout the day.  Symptoms interfering with sleep.  Has had a decreased appetite with associated nausea but denies vomiting, has been able to tolerate some food and fluids.  Known sick contact with croup in household.  Has attempted use of Mucinex, NyQuil, albuterol inhaler.  Denies fevers, ear pain, chest pain or tightness, abdominal symptoms.    Past Medical History:  Diagnosis Date   Allergy    Anemia    Anxiety and depression 03/12/2022   Chorioamnionitis 2019   Clotting disorder ([place]) 08/2020   Black polyps    Depression    Endometriosis    Family history of adverse reaction to anesthesia    mother woke up during abdominal surgery 3 years ago   Family history of Black cancer 04/04/2021   Nausea & vomiting    last month   Pregnancy    19 weeks and few days michelle horvath ob dr   Symptomatic cholelithiasis     Patient Active Problem List   Diagnosis Date Noted   Anxiety and depression 03/12/2022   Vitamin D deficiency 03/12/2022   Absolute anemia 03/12/2022   Anal fissure 12/30/2021   Rectal bleeding 12/30/2021   Rectal pain 12/30/2021   Genetic testing 04/22/2021   Family history of breast cancer 04/04/2021   Family history of Black cancer 04/04/2021   Dysmenorrhea 03/18/2021   Chorioamnionitis 03/24/2017   Indication for care in labor or delivery 03/22/2017   Chronic cholecystitis 11/16/2016   Gallstone 11/06/2016   Encounter for  annual physical exam 05/27/2016   Bacterial vaginosis 08/01/2014    Past Surgical History:  Procedure Laterality Date   CHOLECYSTECTOMY N/A 11/16/2016   Procedure: LAPAROSCOPIC CHOLECYSTECTOMY;  Surgeon: Ileana Roup, MD;  Location: [facility];  Service: General;  Laterality: N/A;   COLONOSCOPY W/ POLYPECTOMY  2023   EYE SURGERY  2022   HEMORRHOID SURGERY N/A 05/13/2021   Procedure: INTERNAL AND EXTERNAL HEMORRHOIDECTOMY;  Surgeon: Georganna Skeans, MD;  Location: [facility];  Service: General;  Laterality: N/A;   TOOTH EXTRACTION      OB History     Gravida  1   Para  1   Term  1   Preterm  0   AB  0   Living  1      SAB  0   IAB  0   Ectopic  0   Multiple  0   Live Births  1            Home Medications    Prior to Admission medications   Medication Sig Start Date End Date Taking? Authorizing Provider  acetaminophen (TYLENOL) 500 MG tablet Take 1,000 mg by mouth every 6 (six) hours as needed for mild pain.   Yes [provider]  albuterol (VENTOLIN HFA) 108 (90 Base) MCG/ACT inhaler Inhale 2 puffs into the lungs every 6 (six) hours as needed for wheezing or shortness of breath. 09/19/21  Yes Allwardt, Alyssa M, PA-C  fluorouracil (EFUDEX) 5 % cream Apply topically 2 (two) times daily. 03/18/22  Yes Regal, Tamala Fothergill, DPM  ibuprofen (ADVIL) 800 MG tablet Take 800 mg by mouth every 6 (six) hours as needed for moderate pain. 01/21/22  Yes [provider]  LARIN 24 FE 1-20 MG-MCG(24) tablet Take 1 tablet by mouth daily. 09/02/21  Yes Allwardt, Alyssa M, PA-C  polyethylene glycol (MIRALAX / GLYCOLAX) 17 g packet Take 17 g by mouth daily. 03/12/22  Yes Allwardt, Alyssa M, PA-C  Vitamin D, Ergocalciferol, (DRISDOL) 1.25 MG (50000 UNIT) CAPS capsule Take 1 capsule (50,000 Units total) by mouth every 7 (seven) days. 03/13/22  Yes Allwardt, Alyssa M, PA-C  dicyclomine (BENTYL) 10 MG capsule Take 1 capsule 30 min before each meal. 02/24/22   [provider]  Diethylpropion HCl CR 75 MG TB24 Take 75 mg by mouth daily. 02/27/21   [provider]  EPIPEN 2-PAK 0.3 MG/0.3ML SOAJ injection Inject 0.3 mg into the muscle as needed for anaphylaxis. 03/10/21   [provider]  fluticasone (FLONASE) 50 MCG/ACT nasal spray Place 2 sprays into both nostrils daily for 7 days. Patient taking differently: Place 2 sprays into both nostrils daily as needed for allergies. 01/14/21 09/02/21  Long, Wonda Olds, MD  fluticasone (FLONASE) 50 MCG/ACT nasal spray Place 1 spray into both nostrils daily. 01/14/21   [provider]  levocetirizine (XYZAL) 5 MG tablet Take 1 tablet by mouth every evening.    [provider]  lidocaine (XYLOCAINE) 5 % ointment Apply topically. 12/30/21   [provider]  famotidine (PEPCID) 20 MG tablet Take 1 tablet (20 mg total) by mouth 2 (two) times daily. Patient not taking: Reported on 11/02/2017 11/06/16 04/24/19  Leftwich-Kirby, Kathie Dike, CNM    Family History Family History  Problem Relation Age of Onset   Asthma Mother    Asthma Father    Epilepsy Father    Diabetes Father    Asthma Sister    Asthma Sister    Asthma Brother    Diabetes Maternal Grandfather    Hypertension Maternal Grandfather    Black cancer Maternal Grandfather 22   Cancer Maternal Grandfather    Breast cancer Paternal Grandfather    Learning disabilities Daughter    Breast cancer Maternal Aunt 36   Cancer Maternal Aunt    Early death Maternal Aunt     Social History Social History   Tobacco Use   Smoking status: Never   Smokeless tobacco: Never  Vaping Use   Vaping Use: Some days  Substance Use Topics   Alcohol use: Yes    Comment: social   Drug use: No     Allergies   Penicillins, Shellfish allergy, and Shellfish-derived products   Review of Systems Review of Systems  Constitutional: Negative.   HENT:  Positive for congestion and sore throat. Negative for dental problem, drooling, ear discharge,  ear pain, facial swelling, hearing loss, mouth sores, nosebleeds, postnasal drip, rhinorrhea, sinus pressure, sinus pain, sneezing, tinnitus, trouble swallowing and voice change.   Respiratory:  Positive for cough, shortness of breath and wheezing. Negative for apnea, choking, chest tightness and stridor.   Cardiovascular: Negative.   Gastrointestinal: Negative.   Musculoskeletal: Negative.   Skin: Negative.      Physical Exam Triage Vital Signs ED Triage Vitals  Enc Vitals Group     BP 03/25/22 1110 (!) 98/58     Pulse Rate 03/25/22 1110 74  Resp 03/25/22 1110 16     Temp 03/25/22 1110 98.5 F (36.9 C)     Temp Source 03/25/22 1110 Oral     SpO2 03/25/22 1110 98 %     Weight 03/25/22 1110 190 lb (86.2 kg)     Height 03/25/22 1110 5\' 4"  (1.626 m)     Head Circumference --      Peak Flow --      Pain Score 03/25/22 1108 6     Pain Loc --      Pain Edu? --      Excl. in [place]? --    No data found.  Updated Vital Signs BP (!) 98/58 (BP Location: Right Arm)   Pulse 74   Temp 98.5 F (36.9 C) (Oral)   Resp 16   Ht 5\' 4"  (1.626 m)   Wt 190 lb (86.2 kg)   LMP 03/22/2022 (Exact Date)   SpO2 98%   BMI 32.61 kg/m   Visual Acuity Right Eye Distance:   Left Eye Distance:   Bilateral Distance:    Right Eye Near:   Left Eye Near:    Bilateral Near:     Physical Exam Constitutional:      Appearance: Normal appearance.  HENT:     Head: Normocephalic.     Right Ear: Tympanic membrane, ear canal and external ear normal.     Left Ear: Tympanic membrane, ear canal and external ear normal.     Nose: Congestion present. No rhinorrhea.     Mouth/Throat:     Mouth: Mucous membranes are moist.     Pharynx: Posterior oropharyngeal erythema present.  Cardiovascular:     Rate and Rhythm: Normal rate and regular rhythm.     Pulses: Normal pulses.     Heart sounds: Normal heart sounds.  Pulmonary:     Effort: Pulmonary effort is normal.     Breath sounds: Normal breath sounds.   Skin:    General: Skin is warm and dry.  Neurological:     Mental Status: She is alert and oriented to person, place, and time. Mental status is at baseline.      UC Treatments / Results  Labs (all labs ordered are listed, but only abnormal results are displayed) Labs Reviewed - No data to display  EKG   Radiology No results found.  Procedures Procedures (including critical care time)  Medications Ordered in UC Medications - No data to display  Initial Impression / Assessment and Plan / UC Course  I have reviewed the triage vital signs and the nursing notes.  Pertinent labs & imaging results that were available during my care of the patient were reviewed by me and considered in my medical decision making (see chart for details).  Acute upper respiratory infection  Patient is in no signs of distress nor toxic appearing.  Vital signs are stable.  Low suspicion for pneumonia, pneumothorax or bronchitis and therefore will defer imaging.  Viral testing deferred due to timeline of illness,.  As symptoms have been present for 14 days without signs of resolution, antibiotic prescribed, azithromycin and prednisone sent to pharmacy, additionally prescribed Tessalon and Promethazine DM for management of coughing.May use additional over-the-counter medications as needed for supportive care.  May follow-up with urgent care as needed if symptoms persist or worsen.   Final Clinical Impressions(s) / UC Diagnoses   Final diagnoses:  None   Discharge Instructions   None    ED Prescriptions   None  PDMP not reviewed this encounter.   Valinda Hoar, NP 03/25/22 [phone]

## 2022-03-25 NOTE — ED Triage Notes (Signed)
Chief Complaint: cough, chest congestion, SOB, and sore throat.   Onset: 2 weeks   Prescriptions or OTC medications tried: Yes- humidifier, inhaler, Mucinex, Nyquil    with no relief  Sick exposure: Yes- daughter has croup   New foods, medications, or products: No  Recent Travel: No

## 2022-03-25 NOTE — Discharge Instructions (Signed)
Symptoms have been present for 2 weeks with no signs of relief we will provide antibiotic to provide coverage for bacteria which may be causing her symptoms to prolong  Begin azithromycin as directed  For your shortness of breath and wheezing begin prednisone every morning for 5 days, this will help to relax the airway making it easier for you to breathe  You may use Tessalon tablet every 8 hours to help calm your coughing  You may use cough syrup every 6 hours as needed for additional comfort, be mindful this may make you feel drowsy    You can take Tylenol and/or Ibuprofen as needed for fever reduction and pain relief.   For cough: honey 1/2 to 1 teaspoon (you can dilute the honey in water or another fluid).  You can also use guaifenesin and dextromethorphan for cough. You can use a humidifier for chest congestion and cough.  If you don't have a humidifier, you can sit in the bathroom with the hot shower running.      For sore throat: try warm salt water gargles, cepacol lozenges, throat spray, warm tea or water with lemon/honey, popsicles or ice, or OTC cold relief medicine for throat discomfort.   For congestion: take a daily anti-histamine like Zyrtec, Claritin, and a oral decongestant, such as pseudoephedrine.  You can also use Flonase 1-2 sprays in each nostril daily.   It is important to stay hydrated: drink plenty of fluids (water, gatorade/powerade/pedialyte, juices, or teas) to keep your throat moisturized and help further relieve irritation/discomfort.

## 2022-03-31 ENCOUNTER — Ambulatory Visit: Payer: Medicaid Other | Admitting: Physician Assistant

## 2022-03-31 ENCOUNTER — Encounter: Payer: Self-pay | Admitting: Podiatry

## 2022-03-31 ENCOUNTER — Encounter: Payer: Self-pay | Admitting: Physician Assistant

## 2022-03-31 VITALS — BP 112/72 | HR 70 | Temp 97.5°F | Ht 64.0 in | Wt 194.0 lb

## 2022-03-31 DIAGNOSIS — R051 Acute cough: Secondary | ICD-10-CM

## 2022-03-31 MED ORDER — ALBUTEROL SULFATE HFA 108 (90 BASE) MCG/ACT IN AERS: 2.0000 | INHALATION_SPRAY | Freq: Four times a day (QID) | RESPIRATORY_TRACT | 0 refills | Status: DC | PRN

## 2022-03-31 MED ORDER — FLUTICASONE-SALMETEROL 115-21 MCG/ACT IN AERO: 2.0000 | INHALATION_SPRAY | Freq: Two times a day (BID) | RESPIRATORY_TRACT | 0 refills | Status: DC

## 2022-03-31 NOTE — Progress Notes (Signed)
Subjective:    Patient ID: Kelsey Black, female    DOB: [birth year], 32 y.o.   MRN: [id number]  Chief Complaint  Patient presents with   Cough    Pt in office due to cough with thick mucus unable to cough up; stating to be painful in ribs and chest and sides, feeling very poorly today, been taking meds prescribed and not helping;     Cough   Patient is in today for cough and congestion x 2 weeks.  She had urgent care visit on 03/25/22 - she was given prednisone and zithromax. Coughing during the night, intermittent during the day, some pain with coughing.  Daughter recently had croup.  She is still feeling tired today.  Denies any fever or chills.  No shortness of breath.  No ear pain or sore throat.  No GI symptoms.  Patient is wanted to have recheck and make sure she did not need anything else for her symptoms.  Past Medical History:  Diagnosis Date   Allergy    Anemia    Anxiety and depression 03/12/2022   Chorioamnionitis 2019   Clotting disorder ([place]) 08/2020   Black polyps    Depression    Endometriosis    Family history of adverse reaction to anesthesia    mother woke up during abdominal surgery 3 years ago   Family history of Black cancer 04/04/2021   Nausea & vomiting    last month   Pregnancy    19 weeks and few days michelle horvath ob dr   Symptomatic cholelithiasis     Past Surgical History:  Procedure Laterality Date   CHOLECYSTECTOMY N/A 11/16/2016   Procedure: LAPAROSCOPIC CHOLECYSTECTOMY;  Surgeon: Ileana Roup, MD;  Location: [facility];  Service: General;  Laterality: N/A;   COLONOSCOPY W/ POLYPECTOMY  2023   EYE SURGERY  2022   HEMORRHOID SURGERY N/A 05/13/2021   Procedure: INTERNAL AND EXTERNAL HEMORRHOIDECTOMY;  Surgeon: Georganna Skeans, MD;  Location: [facility] OR;  Service: General;  Laterality: N/A;   TOOTH EXTRACTION      Family History  Problem Relation Age of Onset   Asthma Mother    Asthma Father    Epilepsy Father    Diabetes  Father    Asthma Sister    Asthma Sister    Asthma Brother    Diabetes Maternal Grandfather    Hypertension Maternal Grandfather    Black cancer Maternal Grandfather 45   Cancer Maternal Grandfather    Breast cancer Paternal Grandfather    Learning disabilities Daughter    Breast cancer Maternal Aunt 18   Cancer Maternal Aunt    Early death Maternal Aunt     Social History   Tobacco Use   Smoking status: Never   Smokeless tobacco: Never  Vaping Use   Vaping Use: Some days  Substance Use Topics   Alcohol use: Yes    Comment: social   Drug use: No     Allergies  Allergen Reactions   Penicillins Other (See Comments), Swelling, Rash and Itching    Reaction:  Arm/leg swelling   Has patient had a PCN reaction causing immediate rash, facial/tongue/throat swelling, SOB or lightheadedness with hypotension: Yes  Has patient had a PCN reaction causing severe rash involving mucus membranes or skin necrosis: No  Has patient had a PCN reaction that required hospitalization: No  Has patient had a PCN reaction occurring within the last 10 years: No  If all of the above answers are "NO",  then may proceed with Cephalosporin use.  Reaction:  Arm/leg swelling  Has patient had a PCN reaction causing immediate rash, facial/tongue/throat swelling, SOB or lightheadedness with hypotension: Yes Has patient had a PCN reaction causing severe rash involving mucus membranes or skin necrosis: No Has patient had a PCN reaction that required hospitalization: No Has patient had a PCN reaction occurring within the last 10 years: No If all of the above answers are "NO", then may proceed with Cephalosporin use.    Leg went numb when younger.    Leg went numb when younger. Reaction:  Arm/leg swelling  Has patient had a PCN reaction causing immediate rash, facial/tongue/throat swelling, SOB or lightheadedness with hypotension: Yes Has patient had a PCN reaction causing severe rash involving mucus membranes  or skin necrosis: No Has patient had a PCN reaction that required hospitalization: No Has patient had a PCN reaction occurring within the last 10 years: No If all of the above answers are "NO", then may proceed with Cephalosporin use.   Shellfish Allergy Other (See Comments), Swelling and Itching    Localized swelling   Shellfish-Derived Products     Other Reaction(s): edema    Review of Systems  Respiratory:  Positive for cough.    NEGATIVE UNLESS OTHERWISE INDICATED IN HPI      Objective:     BP 112/72 (BP Location: Left Arm)   Pulse 70   Temp (!) 97.5 F (36.4 C) (Temporal)   Ht 5' 4"$  (1.626 m)   Wt 194 lb (88 kg)   LMP 03/22/2022 (Exact Date)   SpO2 99%   BMI 33.30 kg/m   Wt Readings from Last 3 Encounters:  03/31/22 194 lb (88 kg)  03/25/22 190 lb (86.2 kg)  03/12/22 189 lb 6.4 oz (85.9 kg)    BP Readings from Last 3 Encounters:  03/31/22 112/72  03/25/22 (!) 98/58  03/12/22 110/74     Physical Exam Vitals and nursing note reviewed.  Constitutional:      General: She is not in acute distress.    Appearance: Normal appearance. She is not ill-appearing.  HENT:     Head: Normocephalic.     Right Ear: Tympanic membrane, ear canal and external ear normal.     Left Ear: Tympanic membrane, ear canal and external ear normal.     Nose: No congestion.     Mouth/Throat:     Mouth: Mucous membranes are moist.     Pharynx: No oropharyngeal exudate or posterior oropharyngeal erythema.  Eyes:     Extraocular Movements: Extraocular movements intact.     Conjunctiva/sclera: Conjunctivae normal.     Pupils: Pupils are equal, round, and reactive to light.  Cardiovascular:     Rate and Rhythm: Normal rate and regular rhythm.     Pulses: Normal pulses.     Heart sounds: Normal heart sounds. No murmur heard. Pulmonary:     Effort: Pulmonary effort is normal. No respiratory distress.     Breath sounds: Normal breath sounds. No wheezing.  Musculoskeletal:     Cervical  back: Normal range of motion.  Skin:    General: Skin is warm.  Neurological:     Mental Status: She is alert and oriented to person, place, and time.  Psychiatric:        Mood and Affect: Mood normal.        Behavior: Behavior normal.        Assessment & Plan:  Acute cough  Other orders -  Fluticasone-Salmeterol; Inhale 2 puffs into the lungs 2 (two) times daily. Rinse mouth after use.  Dispense: 1 each; Refill: 0 -     Albuterol Sulfate HFA; Inhale 2 puffs into the lungs every 6 (six) hours as needed for wheezing or shortness of breath.  Dispense: 8 g; Refill: 0   Productive cough for the last few weeks.  Patient recently completed a course of Zithromax and prednisone.  She has been taking Promethazine DM at night for cough, and this is helping her sleep.  She still has lingering cough symptoms.  Lungs are clear on exam today.  She has a possible history of asthma in her past.  Plan to start on Advair twice daily as directed.  She can use a rescue inhaler for added relief.  Reassured patient that this cough may linger for the next 6 to 8 weeks and this is normal.  Return precautions and ED precautions discussed with patient.  She is agreeable and understanding of plan.    Return if symptoms worsen or fail to improve.  This note was prepared with assistance of Systems analyst. Occasional wrong-word or sound-a-like substitutions may have occurred due to the inherent limitations of voice recognition software.    Merdith Boyd M Krystian Ferrentino, PA-C

## 2022-04-16 ENCOUNTER — Encounter: Payer: Self-pay | Admitting: Physician Assistant

## 2022-04-17 ENCOUNTER — Other Ambulatory Visit: Payer: Self-pay

## 2022-04-17 DIAGNOSIS — R059 Cough, unspecified: Secondary | ICD-10-CM

## 2022-05-01 NOTE — Progress Notes (Unsigned)
Synopsis: Referred for cough by Allwardt, Randa Evens, PA-C  Subjective:   PATIENT ID: Kelsey Black Colon GENDER: female DOB: Dec 04, 1990, MRN: [id number]  No chief complaint on file.  32yF with history of allergy, endometriosis, MDD,   Otherwise pertinent review of systems is negative.  Past Medical History:  Diagnosis Date   Allergy    Anemia    Anxiety and depression 03/12/2022   Chorioamnionitis 2019   Clotting disorder ([place]) 08/2020   Colon polyps    Depression    Endometriosis    Family history of adverse reaction to anesthesia    mother woke up during abdominal surgery 3 years ago   Family history of colon cancer 04/04/2021   Nausea & vomiting    last month   Pregnancy    19 weeks and few days mic[facility] horvath ob dr   Symptomatic cholelithiasis      Family History  Problem Relation Age of Onset   Asthma Mother    Asthma Father    Epilepsy Father    Diabetes Father    Asthma Sister    Asthma Sister    Asthma Brother    Diabetes Maternal Grandfather    Hypertension Maternal Grandfather    Colon cancer Maternal Grandfather 37   Cancer Maternal Grandfather    Breast cancer Paternal Grandfather    Learning disabilities Daughter    Breast cancer Maternal Aunt 67   Cancer Maternal Aunt    Early death Maternal Aunt      Past Surgical History:  Procedure Laterality Date   CHOLECYSTECTOMY N/A 11/16/2016   Procedure: LAPAROSCOPIC CHOLECYSTECTOMY;  Surgeon: Ileana Roup, MD;  Location: [facility];  Service: General;  Laterality: N/A;   COLONOSCOPY W/ POLYPECTOMY  2023   EYE SURGERY  2022   HEMORRHOID SURGERY N/A 05/13/2021   Procedure: INTERNAL AND EXTERNAL HEMORRHOIDECTOMY;  Surgeon: Georganna Skeans, MD;  Location: [facility];  Service: General;  Laterality: N/A;   TOOTH EXTRACTION      Social History   Socioeconomic History   Marital status: Single    Spouse name: Not on file   Number of children: Not on file   Years of education: Not on file    Highest education level: Not on file  Occupational History   Not on file  Tobacco Use   Smoking status: Never   Smokeless tobacco: Never  Vaping Use   Vaping Use: Some days  Substance and Sexual Activity   Alcohol use: Yes    Comment: social   Drug use: No   Sexual activity: Yes    Birth control/protection: Condom, Pill  Other Topics Concern   Not on file  Social History Narrative   ** Merged History Encounter **       Social Determinants of Health   Financial Resource Strain: Not on file  Food Insecurity: Not on file  Transportation Needs: Not on file  Physical Activity: Not on file  Stress: Not on file  Social Connections: Not on file  Intimate Partner Violence: Not on file     Allergies  Allergen Reactions   Penicillins Other (See Comments), Swelling, Rash and Itching    Reaction:  Arm/leg swelling   Has patient had a PCN reaction causing immediate rash, facial/tongue/throat swelling, SOB or lightheadedness with hypotension: Yes  Has patient had a PCN reaction causing severe rash involving mucus membranes or skin necrosis: No  Has patient had a PCN reaction that required hospitalization: No  Has patient had a  PCN reaction occurring within the last 10 years: No  If all of the above answers are "NO", then may proceed with Cephalosporin use.  Reaction:  Arm/leg swelling  Has patient had a PCN reaction causing immediate rash, facial/tongue/throat swelling, SOB or lightheadedness with hypotension: Yes Has patient had a PCN reaction causing severe rash involving mucus membranes or skin necrosis: No Has patient had a PCN reaction that required hospitalization: No Has patient had a PCN reaction occurring within the last 10 years: No If all of the above answers are "NO", then may proceed with Cephalosporin use.    Leg went numb when younger.    Leg went numb when younger. Reaction:  Arm/leg swelling  Has patient had a PCN reaction causing immediate rash,  facial/tongue/throat swelling, SOB or lightheadedness with hypotension: Yes Has patient had a PCN reaction causing severe rash involving mucus membranes or skin necrosis: No Has patient had a PCN reaction that required hospitalization: No Has patient had a PCN reaction occurring within the last 10 years: No If all of the above answers are "NO", then may proceed with Cephalosporin use.   Shellfish Allergy Other (See Comments), Swelling and Itching    Localized swelling   Shellfish-Derived Products     Other Reaction(s): edema     Outpatient Medications Prior to Visit  Medication Sig Dispense Refill   acetaminophen (TYLENOL) 500 MG tablet Take 1,000 mg by mouth every 6 (six) hours as needed for mild pain.     albuterol (VENTOLIN HFA) 108 (90 Base) MCG/ACT inhaler Inhale 2 puffs into the lungs every 6 (six) hours as needed for wheezing or shortness of breath. 8 g 0   azithromycin (ZITHROMAX) 250 MG tablet Take 1 tablet (250 mg total) by mouth daily. Take first 2 tablets together, then 1 every day until finished. (Patient not taking: Reported on 03/31/2022) 6 tablet 0   benzonatate (TESSALON) 100 MG capsule Take 1 capsule (100 mg total) by mouth every 8 (eight) hours. (Patient not taking: Reported on 03/31/2022) 21 capsule 0   dicyclomine (BENTYL) 10 MG capsule Take 1 capsule 30 min before each meal.     Diethylpropion HCl CR 75 MG TB24 Take 75 mg by mouth daily.     EPIPEN 2-PAK 0.3 MG/0.3ML SOAJ injection Inject 0.3 mg into the muscle as needed for anaphylaxis.     fluorouracil (EFUDEX) 5 % cream Apply topically 2 (two) times daily. 40 g 2   fluticasone (FLONASE) 50 MCG/ACT nasal spray Place 2 sprays into both nostrils daily for 7 days. (Patient not taking: Reported on 03/31/2022) 1 g 0   fluticasone (FLONASE) 50 MCG/ACT nasal spray Place 1 spray into both nostrils daily.     fluticasone-salmeterol (ADVAIR HFA) 115-21 MCG/ACT inhaler Inhale 2 puffs into the lungs 2 (two) times daily. Rinse mouth after  use. 1 each 0   ibuprofen (ADVIL) 800 MG tablet Take 800 mg by mouth every 6 (six) hours as needed for moderate pain.     LARIN 24 FE 1-20 MG-MCG(24) tablet Take 1 tablet by mouth daily. 28 tablet 5   levocetirizine (XYZAL) 5 MG tablet Take 1 tablet by mouth every evening.     lidocaine (XYLOCAINE) 5 % ointment Apply topically.     polyethylene glycol (MIRALAX / GLYCOLAX) 17 g packet Take 17 g by mouth daily. 14 each 2   predniSONE (DELTASONE) 20 MG tablet Take 2 tablets (40 mg total) by mouth daily. (Patient not taking: Reported on 03/31/2022) 10 tablet 0  promethazine-dextromethorphan (PROMETHAZINE-DM) 6.25-15 MG/5ML syrup Take 2.5 mLs by mouth 4 (four) times daily as needed for cough. 118 mL 0   Vitamin D, Ergocalciferol, (DRISDOL) 1.25 MG (50000 UNIT) CAPS capsule Take 1 capsule (50,000 Units total) by mouth every 7 (seven) days. 12 capsule 0   No facility-administered medications prior to visit.       Objective:   Physical Exam:  General appearance: 32 y.o., female, NAD, conversant  Eyes: anicteric sclerae; PERRL, tracking appropriately HENT: NCAT; MMM Neck: Trachea midline; no lymphadenopathy, no JVD Lungs: CTAB, no crackles, no wheeze, with normal respiratory effort CV: RRR, no murmur  Abdomen: Soft, non-tender; non-distended, BS present  Extremities: No peripheral edema, warm Skin: Normal turgor and texture; no rash Psych: Appropriate affect Neuro: Alert and oriented to person and place, no focal deficit     There were no vitals filed for this visit.   on *** LPM *** RA BMI Readings from Last 3 Encounters:  03/31/22 33.30 kg/m  03/25/22 32.61 kg/m  03/12/22 38.25 kg/m   Wt Readings from Last 3 Encounters:  03/31/22 194 lb (88 kg)  03/25/22 190 lb (86.2 kg)  03/12/22 189 lb 6.4 oz (85.9 kg)     CBC    Component Value Date/Time   WBC 5.9 03/12/2022 1036   RBC 4.36 03/12/2022 1036   HGB 11.9 (L) 03/12/2022 1036   HCT 37.0 03/12/2022 1036   PLT 359.0  03/12/2022 1036   MCV 85.0 03/12/2022 1036   MCV 84.4 05/19/2015 1343   MCH 26.5 07/26/2021 1257   MCHC 32.2 03/12/2022 1036   RDW 15.5 03/12/2022 1036   LYMPHSABS 1.8 03/12/2022 1036   MONOABS 0.3 03/12/2022 1036   EOSABS 0.2 03/12/2022 1036   BASOSABS 0.0 03/12/2022 1036    Eos 100-200  Chest Imaging: CTA Chest 2020 unremarkable  Pulmonary Functions Testing Results:     No data to display            Echocardiogram: ***  Heart Catheterization: ***    Assessment & Plan:    Plan:      Maryjane Hurter, MD [facility] 05/01/2022 5:01 PM

## 2022-05-04 ENCOUNTER — Ambulatory Visit: Payer: Medicaid Other | Admitting: Student

## 2022-05-04 ENCOUNTER — Encounter: Payer: Self-pay | Admitting: Student

## 2022-05-04 VITALS — BP 134/82 | HR 95 | Temp 97.8°F | Ht 64.0 in | Wt 196.6 lb

## 2022-05-04 DIAGNOSIS — R053 Chronic cough: Secondary | ICD-10-CM

## 2022-05-04 MED ORDER — OMEPRAZOLE 40 MG PO CPDR: 40.0000 mg | DELAYED_RELEASE_CAPSULE | Freq: Every day | ORAL | 0 refills | Status: DC

## 2022-05-04 NOTE — Patient Instructions (Signed)
- omeprazole 40 mg daily 30 minutes before breakfast everyday for 8 weeks - PFTs next visit - feel free to cancel if you're feeling better before next visit - see measures below to limit reflux  Laryngeal irritation leading to perpetual cough: You need to try to suppress your cough to allow your larynx (voice box) to heal.  For three days don't talk, laugh, sing, or clear your throat. Do everything you can to suppress the cough during this time. Use hard candies (sugarless Jolly Ranchers) or non-mint or non-menthol containing cough drops during this time to soothe your throat.  Use a cough suppressant (Delsym or what I have prescribed you) around the clock during this time.  After three days, gradually increase the use of your voice and back off on the cough suppressants.   Gastroesophageal Reflux Disease, Adult Gastroesophageal reflux (GER) happens when acid from the stomach flows up into the tube that connects the mouth and the stomach (esophagus). Normally, food travels down the esophagus and stays in the stomach to be digested. However, when a person has GER, food and stomach acid sometimes move back up into the esophagus. If this becomes a more serious problem, the person may be diagnosed with a disease called gastroesophageal reflux disease (GERD). GERD occurs when the reflux: Happens often. Causes frequent or severe symptoms. Causes problems such as damage to the esophagus. When stomach acid comes in contact with the esophagus, the acid may cause inflammation in the esophagus. Over time, GERD may create small holes (ulcers) in the lining of the esophagus. What are the causes? This condition is caused by a problem with the muscle between the esophagus and the stomach (lower esophageal sphincter, or LES). Normally, the LES muscle closes after food passes through the esophagus to the stomach. When the LES is weakened or abnormal, it does not close properly, and that allows food and stomach acid to  go back up into the esophagus. The LES can be weakened by certain dietary substances, medicines, and medical conditions, including: Tobacco use. Pregnancy. Having a hiatal hernia. Alcohol use. Certain foods and beverages, such as coffee, chocolate, onions, and peppermint. What increases the risk? You are more likely to develop this condition if you: Have an increased body weight. Have a connective tissue disorder. Take NSAIDs, such as ibuprofen. What are the signs or symptoms? Symptoms of this condition include: Heartburn. Difficult or painful swallowing and the feeling of having a lump in the throat. A bitter taste in the mouth. Bad breath and having a large amount of saliva. Having an upset or bloated stomach and belching. Chest pain. Different conditions can cause chest pain. Make sure you see your health care provider if you experience chest pain. Shortness of breath or wheezing. Ongoing (chronic) cough or a nighttime cough. Wearing away of tooth enamel. Weight loss. How is this diagnosed? This condition may be diagnosed based on a medical history and a physical exam. To determine if you have mild or severe GERD, your health care provider may also monitor how you respond to treatment. You may also have tests, including: A test to examine your stomach and esophagus with a small camera (endoscopy). A test that measures the acidity level in your esophagus. A test that measures how much pressure is on your esophagus. A barium swallow or modified barium swallow test to show the shape, size, and functioning of your esophagus. How is this treated? Treatment for this condition may vary depending on how severe your symptoms are.  Your health care provider may recommend: Changes to your diet. Medicine. Surgery. The goal of treatment is to help relieve your symptoms and to prevent complications. Follow these instructions at home: Eating and drinking  Follow a diet as recommended by  your health care provider. This may involve avoiding foods and drinks such as: Coffee and tea, with or without caffeine. Drinks that contain alcohol. Energy drinks and sports drinks. Carbonated drinks or sodas. Chocolate and cocoa. Peppermint and mint flavorings. Garlic and onions. Horseradish. Spicy and acidic foods, including peppers, chili powder, curry powder, vinegar, hot sauces, and barbecue sauce. Citrus fruit juices and citrus fruits, such as oranges, lemons, and limes. Tomato-based foods, such as red sauce, chili, salsa, and pizza with red sauce. Fried and fatty foods, such as donuts, french fries, potato chips, and high-fat dressings. High-fat meats, such as hot dogs and fatty cuts of red and white meats, such as rib eye steak, sausage, ham, and bacon. High-fat dairy items, such as whole milk, butter, and cream cheese. Eat small, frequent meals instead of large meals. Avoid drinking large amounts of liquid with your meals. Avoid eating meals during the 2-3 hours before bedtime. Avoid lying down right after you eat. Do not exercise right after you eat. Lifestyle  Do not use any products that contain nicotine or tobacco. These products include cigarettes, chewing tobacco, and vaping devices, such as e-cigarettes. If you need help quitting, ask your health care provider. Try to reduce your stress by using methods such as yoga or meditation. If you need help reducing stress, ask your health care provider. If you are overweight, reduce your weight to an amount that is healthy for you. Ask your health care provider for guidance about a safe weight loss goal. General instructions Pay attention to any changes in your symptoms. Take over-the-counter and prescription medicines only as told by your health care provider. Do not take aspirin, ibuprofen, or other NSAIDs unless your health care provider told you to take these medicines. Wear loose-fitting clothing. Do not wear anything tight  around your waist that causes pressure on your abdomen. Raise (elevate) the head of your bed about 6 inches (15 cm). You can use a wedge to do this. Avoid bending over if this makes your symptoms worse. Keep all follow-up visits. This is important. Contact a health care provider if: You have: New symptoms. Unexplained weight loss. Difficulty swallowing or it hurts to swallow. Wheezing or a persistent cough. A hoarse voice. Your symptoms do not improve with treatment. Get help right away if: You have sudden pain in your arms, neck, jaw, teeth, or back. You suddenly feel sweaty, dizzy, or light-headed. You have chest pain or shortness of breath. You vomit and the vomit is green, yellow, or black, or it looks like blood or coffee grounds. You faint. You have stool that is red, bloody, or black. You cannot swallow, drink, or eat. These symptoms may represent a serious problem that is an emergency. Do not wait to see if the symptoms will go away. Get medical help right away. Call your local emergency services (911 in the [place] Do not drive yourself to the hospital. Summary Gastroesophageal reflux happens when acid from the stomach flows up into the esophagus. GERD is a disease in which the reflux happens often, causes frequent or severe symptoms, or causes problems such as damage to the esophagus. Treatment for this condition may vary depending on how severe your symptoms are. Your health care provider may recommend diet  and lifestyle changes, medicine, or surgery. Contact a health care provider if you have new or worsening symptoms. Take over-the-counter and prescription medicines only as told by your health care provider. Do not take aspirin, ibuprofen, or other NSAIDs unless your health care provider told you to do so. Keep all follow-up visits as told by your health care provider. This is important. This information is not intended to replace advice given to you by your health care  provider. Make sure you discuss any questions you have with your health care provider. Document Revised: 08/14/2019 Document Reviewed: 08/14/2019 Elsevier Patient Education  [place].

## 2022-05-27 ENCOUNTER — Ambulatory Visit: Payer: Medicaid Other | Admitting: Physician Assistant

## 2022-05-27 VITALS — BP 120/80 | HR 84 | Temp 97.8°F | Ht 64.0 in | Wt 196.4 lb

## 2022-05-27 DIAGNOSIS — R140 Abdominal distension (gaseous): Secondary | ICD-10-CM

## 2022-05-27 NOTE — Patient Instructions (Signed)
It was great to see you!  To treat your constipation today: -Take 100 mg of docusate sodium (also known as Colace, however generic is fine!) by mouth twice daily to soften stools -Drink at least 64 oz of water daily -Add in 2 capful sof polyethylene glycol (also known as Miralax, however generic is fine!) to beverage of choice daily  Goal is to have a formed, soft bowel movement regularly   -After a few days if no success, may increase to a total of 3 capfuls of polyethylene glycol/ Miralax daily  If still no results, please call the office   Follow-up with PCP in 2 weeks, sooner if concerns

## 2022-05-27 NOTE — Progress Notes (Signed)
Kelsey Kelsey [name] is a 32 y.o. female here for a new problem.  History of Present Illness:   Chief Complaint  Patient presents with   Bloated    Pt c/o upper abdomen bloating and gets hard x's several weeks.    HPI   Abdominal Pain Has been having abdominal discomfort after internal bleeding hemorrhoid removal on 08/01/21. Has associated nausea, diminished appetite. Has been using Miralax every few days and taking a fiber supplement for the past year. She also has a liquid medication that she takes for more immediate laxative effect but is unable to tell me what this is. Stays active. Hydration is adequate. Denies unusual weight change, blood in stools. Bowel movements occur every 3 days and stools are hard and elongated. Has to strain. History of endometriosis with irregular menstrual cycles. Status post cholecystectomy 2018. 07/26/21 CT abdomen and pelvis w/contrast showed normal appendix, no bowel obstruction or inflammation, moderate volume stool throughout Kelsey [name]. 03/10/21 colonoscopy showed polyp in rectum, small internal hemorrhoids.    Past Medical History:  Diagnosis Date   Allergy    Anemia    Anxiety and depression 03/12/2022   Chorioamnionitis 2019   Clotting disorder 08/2020   Kelsey [name]    Depression    Endometriosis    Family history of adverse reaction to anesthesia    mother woke up during abdominal surgery 3 years ago   Family history of Kelsey [name] cancer 04/04/2021   Nausea & vomiting    last month   Pregnancy    19 weeks and few days michelle horvath ob dr   Symptomatic cholelithiasis      Social History   Tobacco Use   Smoking status: Never   Smokeless tobacco: Never  Vaping Use   Vaping Use: Some days  Substance Use Topics   Alcohol use: Yes    Comment: social   Drug use: No    Past Surgical History:  Procedure Laterality Date   CHOLECYSTECTOMY N/A 11/16/2016   Procedure: LAPAROSCOPIC CHOLECYSTECTOMY;  Surgeon: Andria Meuse,  MD;  Location: [facility];  Service: General;  Laterality: N/A;   COLONOSCOPY W/ POLYPECTOMY  2023   EYE SURGERY  2022   HEMORRHOID SURGERY N/A 05/13/2021   Procedure: INTERNAL AND EXTERNAL HEMORRHOIDECTOMY;  Surgeon: Violeta Gelinas, MD;  Location: [facility];  Service: General;  Laterality: N/A;   TOOTH EXTRACTION      Family History  Problem Relation Age of Onset   Asthma Mother    Asthma Father    Epilepsy Father    Diabetes Father    Asthma Sister    Asthma Sister    Asthma Brother    Diabetes Maternal Grandfather    Hypertension Maternal Grandfather    Kelsey [name] cancer Maternal Grandfather 88   Cancer Maternal Grandfather    Breast cancer Paternal Grandfather    Learning disabilities Daughter    Breast cancer Maternal Aunt 54   Cancer Maternal Aunt    Early death Maternal Aunt     Allergies  Allergen Reactions   Penicillins Other (See Comments), Swelling, Rash and Itching    Reaction:  Arm/leg swelling   Has patient had a PCN reaction causing immediate rash, facial/tongue/throat swelling, SOB or lightheadedness with hypotension: Yes  Has patient had a PCN reaction causing severe rash involving mucus membranes or skin necrosis: No  Has patient had a PCN reaction that required hospitalization: No  Has patient had a PCN reaction occurring within the last 10 years: No  If  all of the above answers are "NO", then may proceed with Cephalosporin use.  Reaction:  Arm/leg swelling  Has patient had a PCN reaction causing immediate rash, facial/tongue/throat swelling, SOB or lightheadedness with hypotension: Yes Has patient had a PCN reaction causing severe rash involving mucus membranes or skin necrosis: No Has patient had a PCN reaction that required hospitalization: No Has patient had a PCN reaction occurring within the last 10 years: No If all of the above answers are "NO", then may proceed with Cephalosporin use.    Leg went numb when younger.    Leg went numb when younger. Reaction:   Arm/leg swelling  Has patient had a PCN reaction causing immediate rash, facial/tongue/throat swelling, SOB or lightheadedness with hypotension: Yes Has patient had a PCN reaction causing severe rash involving mucus membranes or skin necrosis: No Has patient had a PCN reaction that required hospitalization: No Has patient had a PCN reaction occurring within the last 10 years: No If all of the above answers are "NO", then may proceed with Cephalosporin use.   Shellfish Allergy Other (See Comments), Swelling and Itching    Localized swelling   Shellfish-Derived Products     Other Reaction(s): edema    Current Medications:   Current Outpatient Medications:    acetaminophen (TYLENOL) 500 MG tablet, Take 1,000 mg by mouth every 6 (six) hours as needed for mild pain., Disp: , Rfl:    albuterol (VENTOLIN HFA) 108 (90 Base) MCG/ACT inhaler, Inhale 2 puffs into the lungs every 6 (six) hours as needed for wheezing or shortness of breath., Disp: 8 g, Rfl: 0   benzonatate (TESSALON) 100 MG capsule, Take 1 capsule (100 mg total) by mouth every 8 (eight) hours., Disp: 21 capsule, Rfl: 0   EPIPEN 2-PAK 0.3 MG/0.3ML SOAJ injection, Inject 0.3 mg into the muscle as needed for anaphylaxis., Disp: , Rfl:    fluticasone (FLONASE) 50 MCG/ACT nasal spray, Place 1 spray into both nostrils daily., Disp: , Rfl:    fluticasone-salmeterol (ADVAIR HFA) 115-21 MCG/ACT inhaler, Inhale 2 puffs into the lungs 2 (two) times daily. Rinse mouth after use., Disp: 1 each, Rfl: 0   ibuprofen (ADVIL) 800 MG tablet, Take 800 mg by mouth every 6 (six) hours as needed for moderate pain., Disp: , Rfl:    LARIN 24 FE 1-20 MG-MCG(24) tablet, Take 1 tablet by mouth daily., Disp: 28 tablet, Rfl: 5   levocetirizine (XYZAL) 5 MG tablet, Take 1 tablet by mouth every evening., Disp: , Rfl:    omeprazole (PRILOSEC) 40 MG capsule, Take 1 capsule (40 mg total) by mouth daily., Disp: 90 capsule, Rfl: 0   polyethylene glycol (MIRALAX / GLYCOLAX) 17 g  packet, Take 17 g by mouth daily., Disp: 14 each, Rfl: 2   Vitamin D, Ergocalciferol, (DRISDOL) 1.25 MG (50000 UNIT) CAPS capsule, Take 1 capsule (50,000 Units total) by mouth every 7 (seven) days., Disp: 12 capsule, Rfl: 0   Review of Systems:   Review of Systems  Constitutional:  Negative for fever and malaise/fatigue.  HENT:  Negative for congestion.   Eyes:  Negative for blurred vision.  Respiratory:  Negative for cough and shortness of breath.   Cardiovascular:  Negative for chest pain, palpitations and leg swelling.  Gastrointestinal:  Positive for abdominal pain and nausea. Negative for vomiting.  Musculoskeletal:  Negative for back pain.  Skin:  Negative for rash.  Neurological:  Negative for loss of consciousness and headaches.    Vitals:   Vitals:  05/27/22 1321  BP: 120/80  Pulse: 84  Temp: 97.8 F (36.6 C)  TempSrc: Temporal  SpO2: 99%  Weight: 196 lb 6.1 oz (89.1 kg)  Height: 5\' 4"  (1.626 m)     Body mass index is 33.71 kg/m.  Physical Exam:   Physical Exam Vitals and nursing note reviewed.  Constitutional:      General: She is not in acute distress.    Appearance: She is well-developed. She is not ill-appearing or toxic-appearing.  Cardiovascular:     Rate and Rhythm: Normal rate and regular rhythm.     Pulses: Normal pulses.     Heart sounds: Normal heart sounds, S1 normal and S2 normal.  Pulmonary:     Effort: Pulmonary effort is normal.     Breath sounds: Normal breath sounds.  Abdominal:     General: Abdomen is flat. Bowel sounds are normal.     Palpations: Abdomen is soft.     Tenderness: There is no abdominal tenderness. There is no right CVA tenderness or left CVA tenderness.  Skin:    General: Skin is warm and dry.  Neurological:     Mental Status: She is alert.     GCS: GCS eye subscore is 4. GCS verbal subscore is 5. GCS motor subscore is 6.  Psychiatric:        Speech: Speech normal.        Behavior: Behavior normal. Behavior is  cooperative.     Assessment and Plan:   Abdominal bloating No red flags Suspect constipation Recommend miralax 2 capfuls daily, 64 oz water and 200 mg colace daily Recommend maintaining this regimen (may decrease miralax to 1 capful) to have regular, formed bowel movements that do not require straining Recommend follow-up with PCP in 1-2 weeks, sooner if concerns  Consider amitiza or linzess vs referral back to gastro   I,Alexander Ruley,acting as a scribe for [employer], PA.,have documented all relevant documentation on the behalf of Jarold Motto, PA,as directed by  Jarold Motto, PA while in the presence of Jarold Motto, [place].  I, Jarold Motto, [place], have reviewed all documentation for this visit. The documentation on 05/27/22 for the exam, diagnosis, procedures, and orders are all accurate and complete.  Jarold Motto, PA-C

## 2022-06-03 ENCOUNTER — Ambulatory Visit: Payer: Medicaid Other | Admitting: Physician Assistant

## 2022-06-14 ENCOUNTER — Telehealth: Payer: Medicaid Other | Admitting: Family

## 2022-06-14 DIAGNOSIS — R399 Unspecified symptoms and signs involving the genitourinary system: Secondary | ICD-10-CM

## 2022-06-14 MED ORDER — SULFAMETHOXAZOLE-TRIMETHOPRIM 800-160 MG PO TABS: 1.0000 | ORAL_TABLET | Freq: Two times a day (BID) | ORAL | 0 refills | Status: DC

## 2022-06-14 NOTE — Addendum Note (Signed)
Addended by: Jannifer Rodney A on: 06/14/2022 10:49 AM   Modules accepted: Orders

## 2022-06-14 NOTE — Progress Notes (Signed)

## 2022-06-29 ENCOUNTER — Ambulatory Visit: Payer: Medicaid Other | Admitting: Student

## 2023-02-15 ENCOUNTER — Ambulatory Visit: Payer: BC Managed Care – PPO | Admitting: Family Medicine

## 2023-02-15 ENCOUNTER — Ambulatory Visit: Payer: Self-pay | Admitting: Physician Assistant

## 2023-02-15 ENCOUNTER — Encounter: Payer: Self-pay | Admitting: Family Medicine

## 2023-02-15 VITALS — BP 98/64 | HR 80 | Temp 98.1°F | Ht 64.0 in | Wt 208.6 lb

## 2023-02-15 DIAGNOSIS — J069 Acute upper respiratory infection, unspecified: Secondary | ICD-10-CM | POA: Diagnosis not present

## 2023-02-15 DIAGNOSIS — B9689 Other specified bacterial agents as the cause of diseases classified elsewhere: Secondary | ICD-10-CM

## 2023-02-15 DIAGNOSIS — M940 Chondrocostal junction syndrome [Tietze]: Secondary | ICD-10-CM

## 2023-02-15 MED ORDER — PREDNISONE 20 MG PO TABS
40.0000 mg | ORAL_TABLET | Freq: Every day | ORAL | 0 refills | Status: AC
Start: 2023-02-15 — End: 2023-02-20

## 2023-02-15 MED ORDER — PROMETHAZINE-DM 6.25-15 MG/5ML PO SYRP
5.0000 mL | ORAL_SOLUTION | Freq: Four times a day (QID) | ORAL | 0 refills | Status: DC | PRN
Start: 2023-02-15 — End: 2023-09-20

## 2023-02-15 MED ORDER — AZITHROMYCIN 250 MG PO TABS
ORAL_TABLET | ORAL | 0 refills | Status: DC
Start: 2023-02-15 — End: 2023-09-20

## 2023-02-15 NOTE — Telephone Encounter (Signed)
Copied from CRM [id number]. Topic: Clinical - Medication Question >> Feb 15, 2023 12:22 PM Geneva B wrote: Reason for CRM: pt is calling in she says that her child is sick and she believes that she is as well from her child and she was wanting to see if she could get a rx antibiotic  Called pt and was advised she was able to be seen at another [place] office today and got a Z pack. Pt advised to let us know if she doesn't improve and needs anything further.

## 2023-02-15 NOTE — Patient Instructions (Signed)
 I have sent in azithromycin for you to take.  Take 2 tablets today, then 1 tablet daily for the next 4 days.  I have sent in hydrocodone cough syrup for you to take 5 mL once daily in the evening as needed for cough.  This medication may make you sleepy.  Do not drive or operate heavy machinery while taking this medication.  I have sent in prednisone for you to take 2 tablets once daily in the morning with breakfast for the next 5 days.  Follow-up with me for new or worsening symptoms.

## 2023-02-15 NOTE — Telephone Encounter (Signed)
Copied from CRM [id number]. Topic: Clinical - Red Word Triage >> Feb 15, 2023  1:06 PM Truddie Crumble wrote: Red Word that prompted transfer to Nurse Triage: possible walking pneumonia, cough, ear pain, cant smell and face hurts   Chief Complaint: cough and congestion Symptoms: fever, ear stuffy, nasal congestion Frequency: constant x 2 weeks Pertinent Negatives: Patient denies lung disease Disposition: [] ED /[] Urgent Care (no appt availability in office) / [x] Appointment(In office/virtual)/ []  [place] [facility]/ [] Home Care/ [] Refused Recommended Disposition /[] [facility]/ []  Follow-up with PCP Additional Notes: Pt reports recent exposure to pneumonia, sts that she's tried several OTC medications and home remedies but cough persists. Appt scheduled for today at [facility]     Reason for Disposition  [1] Using nasal washes and pain medicine > 24 hours AND [2] sinus pain (around cheekbone or eye) persists  Answer Assessment - Initial Assessment Questions 1. ONSET: "When did the cough begin?"      2 weeks  2. SEVERITY: "How bad is the cough today?"      Getting worse  3. SPUTUM: "Describe the color of your sputum" (none, dry cough; clear, white, yellow, green)     Thick yellowish- green  4. HEMOPTYSIS: "Are you coughing up any blood?" If so ask: "How much?" (flecks, streaks, tablespoons, etc.)     No blood  5. DIFFICULTY BREATHING: "Are you having difficulty breathing?" If Yes, ask: "How bad is it?" (e.g., mild, moderate, severe)    - MILD: No SOB at rest, mild SOB with walking, speaks normally in sentences, can lie down, no retractions, pulse < 100.    - MODERATE: SOB at rest, SOB with minimal exertion and prefers to sit, cannot lie down flat, speaks in phrases, mild retractions, audible wheezing, pulse 100-120.    - SEVERE: Very SOB at rest, speaks in single words, struggling to breathe, sitting hunched forward, retractions, pulse > 120      Mild SOB, mainly when  coughing  6. FEVER: "Do you have a fever?" If Yes, ask: "What is your temperature, how was it measured, and when did it start?"     Had a fever the past few times  7. CARDIAC HISTORY: "Do you have any history of heart disease?" (e.g., heart attack, congestive heart failure)      No cardiac history   8. LUNG HISTORY: "Do you have any history of lung disease?"  (e.g., pulmonary embolus, asthma, emphysema)     No lung disease  9. PE RISK FACTORS: "Do you have a history of blood clots?" (or: recent major surgery, recent prolonged travel, bedridden)     No   10. OTHER SYMPTOMS: "Do you have any other symptoms?" (e.g., runny nose, wheezing, chest pain)       Facial pain, stuffy ears, runny nose, stuffy nose  11. PREGNANCY: "Is there any chance you are pregnant?" "When was your last menstrual period?"       No   12. TRAVEL: "Have you traveled out of the country in the last month?" (e.g., travel history, exposures)       Exposed to walking pneumonia  Protocols used: Cough - Acute Productive-A-AH

## 2023-02-15 NOTE — Telephone Encounter (Signed)
FYI, see Triage message, pt scheduled today at [facility].

## 2023-02-15 NOTE — Telephone Encounter (Signed)
Noted and agreed, thank you. 

## 2023-02-16 ENCOUNTER — Encounter: Payer: Self-pay | Admitting: Family Medicine

## 2023-02-19 ENCOUNTER — Encounter: Payer: Self-pay | Admitting: Family Medicine

## 2023-03-10 ENCOUNTER — Other Ambulatory Visit: Payer: Self-pay

## 2023-08-31 ENCOUNTER — Encounter: Payer: Self-pay | Admitting: Physician Assistant

## 2023-09-20 ENCOUNTER — Encounter: Payer: Self-pay | Admitting: Physician Assistant

## 2023-09-20 ENCOUNTER — Ambulatory Visit: Admitting: Physician Assistant

## 2023-09-20 ENCOUNTER — Ambulatory Visit (INDEPENDENT_AMBULATORY_CARE_PROVIDER_SITE_OTHER): Admitting: Physician Assistant

## 2023-09-20 VITALS — BP 124/82 | HR 51 | Temp 97.9°F | Ht 64.0 in | Wt 200.4 lb

## 2023-09-20 DIAGNOSIS — Z91013 Allergy to seafood: Secondary | ICD-10-CM | POA: Diagnosis not present

## 2023-09-20 DIAGNOSIS — F419 Anxiety disorder, unspecified: Secondary | ICD-10-CM | POA: Diagnosis not present

## 2023-09-20 DIAGNOSIS — F32A Depression, unspecified: Secondary | ICD-10-CM

## 2023-09-20 MED ORDER — BUSPIRONE HCL 7.5 MG PO TABS
7.5000 mg | ORAL_TABLET | Freq: Two times a day (BID) | ORAL | 1 refills | Status: AC
Start: 2023-09-20 — End: 2023-10-20

## 2023-09-20 MED ORDER — EPIPEN 2-PAK 0.3 MG/0.3ML IJ SOAJ
0.3000 mg | INTRAMUSCULAR | 1 refills | Status: DC | PRN
Start: 2023-09-20 — End: 2023-09-24

## 2023-09-20 NOTE — Progress Notes (Signed)
 "   Patient ID: Kelsey Black, female    DOB: 10-07-1990, 33 y.o.   MRN: [id number]   Assessment & Plan:  Anxiety and depression -     busPIRone  HCl; Take 1 tablet (7.5 mg total) by mouth 2 (two) times daily.  Dispense: 60 tablet; Refill: 1  Shellfish allergy -     EpiPen  2-Pak; Inject 0.3 mg into the muscle as needed for anaphylaxis.  Dispense: 1 each; Refill: 1      Assessment and Plan Assessment & Plan Anxiety and Depression She experiences anxiety symptoms including feeling keyed up, trouble relaxing, irritability, and appetite changes. 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 Previously used escitalopram  (Lexapro ) for depression in 2023. Wellbutrin was considered for focus and motivation but may exacerbate anxiety. Buspirone  (Buspar ) was suggested due to better tolerance and non-habit forming properties, though it requires dosing two to three times daily. Gene testing was offered to tailor medication choices based on her DNA. She agreed to try Buspirone  while awaiting gene testing results. - Start Buspirone  7.5 mg twice daily. - Perform gene testing for anxiety and depression medications. - Consider medication adjustment based on gene testing results.  Shellfish Allergy She requires an EpiPen  for a sugar allergy. Her current EpiPen  has expired. - Refill EpiPen  prescription.      Return in about 6 weeks (around 11/01/2023) for recheck/follow-up.    Subjective:    Chief Complaint  Patient presents with   Anxiety    Pt in office for anxiety and requesting medication/ treatment    HPI Discussed the use of AI scribe software for clinical note transcription with the patient, who gave verbal consent to proceed.  History of Present Illness Kelsey Black is a 33 year old female with depression and anxiety who presents with emotional distress and difficulty focusing.  She  experiences ongoing emotional distress characterized by feeling 'keyed up,' trouble relaxing, irritability, and being easily annoyed. These symptoms have been exacerbated by a challenging work environment, including reporting her chief for a hostile work environment. She has cried at work due to feeling overwhelmed and describes her emotions as 'all over the place.' She expresses a need for something to calm her down while maintaining focus.  She has a history of depression and anxiety, previously treated with escitalopram  (Lexapro ) in 2023, and is open to trying a daily medication to manage her symptoms. She reports changes in appetite, specifically overeating, and a general lack of interest in activities, accompanied by persistent tiredness. No suicidal thoughts are present.  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 Her daughter, Kelsey Black, who is 87 years old, has autism and is undergoing ABA therapy, which has improved her communication and reduced biting behaviors. She plans to enroll her daughter in school this year. Her mother and husband assist with childcare, picking up [place] from school and adjusting schedules as needed. She works night shifts four times a month, affecting her schedule and sleep habits.  She reports sleeping well and that her daughter sleeps through the night. She notes ear pain when wearing headphones.     Past Medical History:  Diagnosis Date   Allergy    Anemia    Anxiety and depression 03/12/2022   Chorioamnionitis 2019   Clotting disorder (HCC) 08/2020   Black polyps    Depression    Endometriosis  Family history of adverse reaction to anesthesia    mother woke up during abdominal surgery 3 years ago   Family history of Black cancer 04/04/2021   Nausea & vomiting    last month   Pregnancy    19 weeks and few days Kelsey Black ob dr   Symptomatic cholelithiasis     Past  Surgical History:  Procedure Laterality Date   CHOLECYSTECTOMY N/A 11/16/2016   Procedure: LAPAROSCOPIC CHOLECYSTECTOMY;  Surgeon: Teresa Lonni HERO, MD;  Location: [facility];  Service: General;  Laterality: N/A;   COLONOSCOPY W/ POLYPECTOMY  2023   EYE SURGERY  2022   HEMORRHOID SURGERY N/A 05/13/2021   Procedure: INTERNAL AND EXTERNAL HEMORRHOIDECTOMY;  Surgeon: Sebastian Moles, MD;  Location: [facility] OR;  Service: General;  Laterality: N/A;   TOOTH EXTRACTION      Family History  Problem Relation Age of Onset   Asthma Mother    Asthma Father    Epilepsy Father    Diabetes Father    Asthma Sister    Asthma Sister    Asthma Brother    Diabetes Maternal Grandfather    Hypertension Maternal Grandfather    Black cancer Maternal Grandfather 61   Cancer Maternal Grandfather    Breast cancer Paternal Grandfather    Learning disabilities Daughter    Breast cancer Maternal Aunt 57   Cancer Maternal Aunt    Early death Maternal Aunt     Social History   Tobacco Use   Smoking status: Never   Smokeless tobacco: Never  Vaping Use   Vaping status: Some Days  Substance Use Topics   Alcohol use: Yes    Comment: social   Drug use: No     Allergies  Allergen Reactions   Penicillins Other (See Comments), Swelling, Rash and Itching    Reaction:  Arm/leg swelling   Has patient had a PCN reaction causing immediate rash, facial/tongue/throat swelling, SOB or lightheadedness with hypotension: Yes  Has patient had a PCN reaction causing severe rash involving mucus membranes or skin necrosis: No  Has patient had a PCN reaction that required hospitalization: No  Has patient had a PCN reaction occurring within the last 10 years: No  If all of the above answers are NO, then may proceed with Cephalosporin use.  Reaction:  Arm/leg swelling  Has patient had a PCN reaction causing immediate rash, facial/tongue/throat swelling, SOB or lightheadedness with hypotension: Yes Has patient had a  PCN reaction causing severe rash involving mucus membranes or skin necrosis: No Has patient had a PCN reaction that required hospitalization: No Has patient had a PCN reaction occurring within the last 10 years: No If all of the above answers are NO, then may proceed with Cephalosporin use.    Leg went numb when younger.    Leg went numb when younger. Reaction:  Arm/leg swelling  Has patient had a PCN reaction causing immediate rash, facial/tongue/throat swelling, SOB or lightheadedness with hypotension: Yes Has patient had a PCN reaction causing severe rash involving mucus membranes or skin necrosis: No Has patient had a PCN reaction that required hospitalization: No Has patient had a PCN reaction occurring within the last 10 years: No If all of the above answers are NO, then may proceed with Cephalosporin use.   Shellfish Allergy Other (See Comments), Swelling and Itching    Localized swelling   Shellfish-Derived Products     Other Reaction(s): edema    Review of Systems NEGATIVE UNLESS OTHERWISE INDICATED IN HPI  Objective:     BP 124/82 (BP Location: Left Arm, Patient Position: Sitting, Cuff Size: Normal)   Pulse (!) 51   Temp 97.9 F (36.6 C) (Temporal)   Ht 5' 4 (1.626 m)   Wt 200 lb 6.4 oz (90.9 kg)   LMP 08/31/2023 (Exact Date)   SpO2 94%   BMI 34.40 kg/m   Wt Readings from Last 3 Encounters:  09/20/23 200 lb 6.4 oz (90.9 kg)  02/15/23 208 lb 9.6 oz (94.6 kg)  05/27/22 196 lb 6.1 oz (89.1 kg)    BP Readings from Last 3 Encounters:  09/20/23 124/82  02/15/23 98/64  05/27/22 120/80     Physical Exam Vitals and nursing note reviewed.  Constitutional:      Appearance: Normal appearance. She is normal weight. She is not toxic-appearing.  HENT:     Head: Normocephalic and atraumatic.     Right Ear: Tympanic membrane, ear canal and external ear normal.     Left Ear: Tympanic membrane, ear canal and external ear normal.  Eyes:     Extraocular Movements:  Extraocular movements intact.     Conjunctiva/sclera: Conjunctivae normal.     Pupils: Pupils are equal, round, and reactive to light.  Cardiovascular:     Rate and Rhythm: Normal rate and regular rhythm.     Pulses: Normal pulses.     Heart sounds: Normal heart sounds.  Pulmonary:     Effort: Pulmonary effort is normal.     Breath sounds: Normal breath sounds.  Musculoskeletal:        General: Normal range of motion.     Cervical back: Normal range of motion and neck supple.  Skin:    General: Skin is warm and dry.  Neurological:     General: No focal deficit present.     Mental Status: She is alert and oriented to person, place, and time.  Psychiatric:        Mood and Affect: Mood normal.        Behavior: Behavior normal.             Vernon Ariel M Idris Edmundson, PA-C "

## 2023-09-24 ENCOUNTER — Other Ambulatory Visit: Payer: Self-pay | Admitting: Physician Assistant

## 2023-09-24 ENCOUNTER — Ambulatory Visit: Admitting: Physician Assistant

## 2023-09-24 ENCOUNTER — Other Ambulatory Visit (HOSPITAL_COMMUNITY): Payer: Self-pay

## 2023-09-24 ENCOUNTER — Telehealth: Payer: Self-pay

## 2023-09-24 DIAGNOSIS — Z91013 Allergy to seafood: Secondary | ICD-10-CM

## 2023-09-24 MED ORDER — EPINEPHRINE 0.3 MG/0.3ML IJ SOAJ
0.3000 mg | INTRAMUSCULAR | 2 refills | Status: AC | PRN
Start: 2023-09-24 — End: ?

## 2023-09-24 NOTE — Telephone Encounter (Signed)
 Please see  note for PA pharmacy team and advise

## 2023-09-24 NOTE — Telephone Encounter (Signed)
 Pharmacy Patient Advocate Encounter   Received notification from Onbase that prior authorization for Epi-Pen 0.3 mg/0.3 ml pen is required/requested.   Insurance verification completed.   The patient is insured through [facility] .   Per test claim:  generic Epinephrine  is preferred by the insurance.  If suggested medication is appropriate, Please send in a new RX and discontinue this one. If not, please advise as to why it's not appropriate so that we may request a Prior Authorization. Please note, some preferred medications may still require a PA.  If the suggested medications have not been trialed and there are no contraindications to their use, the PA will not be submitted, as it will not be approved.  *This was sent in for brand Epi-pen, was that correct? The brand is not on formulary but the generic will go through the insurance with no PA required with a copay of $30.

## 2023-09-29 ENCOUNTER — Other Ambulatory Visit: Payer: Self-pay

## 2023-10-21 ENCOUNTER — Ambulatory Visit: Admitting: Physician Assistant

## 2023-11-01 ENCOUNTER — Ambulatory Visit: Admitting: Physician Assistant

## 2023-11-01 ENCOUNTER — Ambulatory Visit (INDEPENDENT_AMBULATORY_CARE_PROVIDER_SITE_OTHER): Admitting: Physician Assistant

## 2023-11-01 VITALS — BP 112/62 | HR 80 | Temp 98.2°F | Ht 64.0 in | Wt 198.6 lb

## 2023-11-01 DIAGNOSIS — F419 Anxiety disorder, unspecified: Secondary | ICD-10-CM

## 2023-11-01 DIAGNOSIS — R252 Cramp and spasm: Secondary | ICD-10-CM

## 2023-11-01 DIAGNOSIS — R5383 Other fatigue: Secondary | ICD-10-CM

## 2023-11-01 DIAGNOSIS — F32A Depression, unspecified: Secondary | ICD-10-CM | POA: Diagnosis not present

## 2023-11-01 DIAGNOSIS — Z23 Encounter for immunization: Secondary | ICD-10-CM

## 2023-11-01 LAB — IBC + FERRITIN
Ferritin: 30 ng/mL (ref 10.0–291.0)
Iron: 23 ug/dL — ABNORMAL LOW (ref 42–145)
Saturation Ratios: 7.6 % — ABNORMAL LOW (ref 20.0–50.0)
TIBC: 302.4 ug/dL (ref 250.0–450.0)
Transferrin: 216 mg/dL (ref 212.0–360.0)

## 2023-11-01 LAB — TSH: TSH: 0 u[IU]/mL — ABNORMAL LOW (ref 0.35–5.50)

## 2023-11-01 LAB — COMPREHENSIVE METABOLIC PANEL WITH GFR
ALT: 15 U/L (ref 0–35)
AST: 15 U/L (ref 0–37)
Albumin: 3.9 g/dL (ref 3.5–5.2)
Alkaline Phosphatase: 94 U/L (ref 39–117)
BUN: 11 mg/dL (ref 6–23)
CO2: 28 meq/L (ref 19–32)
Calcium: 9 mg/dL (ref 8.4–10.5)
Chloride: 104 meq/L (ref 96–112)
Creatinine, Ser: 0.5 mg/dL (ref 0.40–1.20)
GFR: 123.85 mL/min (ref >60.00)
Glucose, Bld: 100 mg/dL — ABNORMAL HIGH (ref 70–99)
Potassium: 3.9 meq/L (ref 3.5–5.1)
Sodium: 138 meq/L (ref 135–145)
Total Bilirubin: 0.3 mg/dL (ref 0.2–1.2)
Total Protein: 7 g/dL (ref 6.0–8.3)

## 2023-11-01 LAB — CBC WITH DIFFERENTIAL/PLATELET
Basophils Absolute: 0 K/uL (ref 0.0–0.1)
Basophils Relative: 0.2 % (ref 0.0–3.0)
Eosinophils Absolute: 0.2 K/uL (ref 0.0–0.7)
Eosinophils Relative: 2.3 % (ref 0.0–5.0)
HCT: 36.5 % (ref 36.0–46.0)
Hemoglobin: 11.6 g/dL — ABNORMAL LOW (ref 12.0–15.0)
Lymphocytes Relative: 35.1 % (ref 12.0–46.0)
Lymphs Abs: 2.3 K/uL (ref 0.7–4.0)
MCHC: 31.6 g/dL (ref 30.0–36.0)
MCV: 76.8 fl — ABNORMAL LOW (ref 78.0–100.0)
Monocytes Absolute: 0.4 K/uL (ref 0.1–1.0)
Monocytes Relative: 6.1 % (ref 3.0–12.0)
Neutro Abs: 3.8 K/uL (ref 1.4–7.7)
Neutrophils Relative %: 56.3 % (ref 43.0–77.0)
Platelets: 372 K/uL (ref 150.0–400.0)
RBC: 4.76 Mil/uL (ref 3.87–5.11)
RDW: 15.6 % — ABNORMAL HIGH (ref 11.5–15.5)
WBC: 6.7 K/uL (ref 4.0–10.5)

## 2023-11-01 LAB — VITAMIN B12: Vitamin B-12: 385 pg/mL (ref 211–911)

## 2023-11-01 LAB — VITAMIN D 25 HYDROXY (VIT D DEFICIENCY, FRACTURES): VITD: 14.34 ng/mL — ABNORMAL LOW (ref 30.00–100.00)

## 2023-11-01 MED ORDER — DESVENLAFAXINE SUCCINATE ER 25 MG PO TB24: 25.0000 mg | ORAL_TABLET | Freq: Every day | ORAL | 0 refills | Status: DC

## 2023-11-01 NOTE — Patient Instructions (Signed)
  VISIT SUMMARY: Today, we discussed your ongoing anxiety and the lack of improvement with your current medication, BuSpar . We also addressed your fatigue and nocturnal leg spasms, which may be related to low iron levels or other metabolic issues.  YOUR PLAN: ANXIETY DISORDER: Your anxiety symptoms have not improved significantly with BuSpar , and genetic testing suggests Pristiq  may be a better option. -Stop taking BuSpar . -Start taking Pristiq  25 mg once daily for one week, then increase to 50 mg once daily. -Watch for side effects like dizziness, trouble sleeping, and changes in libido. -Come back in four weeks to see how Pristiq  is working for you.  FATIGUE: Your fatigue might be due to low iron levels or other deficiencies like vitamin D  or B12. -We will do a blood test to check your iron, vitamin D , B12, and other important levels.  MUSCLE CRAMPS AND SPASMS: Your nighttime leg spasms might be related to low iron or other metabolic issues. -We will do a blood test to check your iron levels and other possible causes. -If your iron levels are low, we may start you on an iron supplement.                      Contains text generated by [employer].                                 Contains text generated by [employer].

## 2023-11-01 NOTE — Progress Notes (Signed)
 Patient ID: Kelsey Black, female    DOB: Mar 03, 1990, 33 y.o.   MRN: [id number]   Assessment & Plan:  Anxiety and depression -     Desvenlafaxine  Succinate ER; Take 1 tablet (25 mg total) by mouth daily.  Dispense: 30 tablet; Refill: 0  Leg cramps -     VITAMIN D  25 Hydroxy (Vit-D Deficiency, Fractures) -     Vitamin B12 -     TSH -     Comprehensive metabolic panel with GFR -     CBC with Differential/Platelet -     IBC + Ferritin  Other fatigue -     VITAMIN D  25 Hydroxy (Vit-D Deficiency, Fractures) -     Vitamin B12 -     TSH -     Comprehensive metabolic panel with GFR -     CBC with Differential/Platelet -     IBC + Ferritin  Immunization due -     Flu vaccine trivalent PF, 6mos and older(Flulaval,Afluria,Fluarix,Fluzone)      Assessment and Plan Assessment & Plan Anxiety disorder Anxiety disorder with no significant improvement on BuSpar , which shows moderate response on gene site testing. Pristiq  identified as a potentially effective alternative based on genetic testing. - Discontinue BuSpar . - Initiate Pristiq  25 mg once daily for one week, then increase to 50 mg once daily. - Monitor for side effects such as dizziness, insomnia, and changes in libido. - Schedule follow-up in four weeks to assess response to Pristiq .  Fatigue Fatigue potentially related to low iron levels or other metabolic imbalances. Previous low iron storage noted. Vitamin D  or B12 deficiencies may contribute. - Order fatigue panel to evaluate iron, vitamin D , B12, and other relevant metabolic markers.  Muscle cramps and spasms Muscle cramps and spasms, particularly at night, possibly related to low iron levels. No prior history of these symptoms before current medication. Low iron, magnesium, or potassium levels could contribute. - Order fatigue panel to assess iron levels and other potential metabolic causes. - Consider starting iron supplementation if iron levels are  low.      Return in about 4 weeks (around 11/29/2023) for recheck/follow-up (virtual is OK too) .    Subjective:    Chief Complaint  Patient presents with   Anxiety    Pt in office for anxiety/depression follow u and to discuss [facility] results; pt still taking Buspirone  7.5 from Aug visit; states having recent leg cramps not sure if this is related. Pt also states needing to schedule annual CPE for lab work;    HPI Discussed the use of AI scribe software for clinical note transcription with the patient, who gave verbal consent to proceed.  History of Present Illness Kelsey Black is a 33 year old female who presents with anxiety and medication management.  She has persistent anxiety symptoms despite taking BuSpar  twice daily. Previous trials with Wellbutrin and Lexapro  were unsuccessful.  She experiences nocturnal leg spasms, described as 'weird spasms or Charlie horses,' which she associates with her current medication. The sensation is described as a pain that feels like 'it's asleep with the bone inside.' These symptoms were not present before starting the medication.  Her iron storage has been low in the past, and she is not currently taking an iron supplement. She is unsure if her symptoms are related to her medication or something else. She has a history of low potassium, but it has been stable.  No formal testing for ADHD or bipolar disorder  as an adult, although she recalls possible bipolar testing during her teenage years. She is a non-smoker.     Past Medical History:  Diagnosis Date   Allergy    Anemia    Anxiety and depression 03/12/2022   Chorioamnionitis 2019   Clotting disorder (HCC) 08/2020   Black polyps    Depression    Endometriosis    Family history of adverse reaction to anesthesia    mother woke up during abdominal surgery 3 years ago   Family history of Black cancer 04/04/2021   Nausea & vomiting    last month   Pregnancy    19  weeks and few days michelle horvath ob dr   Symptomatic cholelithiasis     Past Surgical History:  Procedure Laterality Date   CHOLECYSTECTOMY N/A 11/16/2016   Procedure: LAPAROSCOPIC CHOLECYSTECTOMY;  Surgeon: Teresa Lonni HERO, MD;  Location: [facility];  Service: General;  Laterality: N/A;   COLONOSCOPY W/ POLYPECTOMY  2023   EYE SURGERY  2022   HEMORRHOID SURGERY N/A 05/13/2021   Procedure: INTERNAL AND EXTERNAL HEMORRHOIDECTOMY;  Surgeon: Sebastian Moles, MD;  Location: [facility];  Service: General;  Laterality: N/A;   TOOTH EXTRACTION      Family History  Problem Relation Age of Onset   Asthma Mother    Asthma Father    Epilepsy Father    Diabetes Father    Asthma Sister    Asthma Sister    Asthma Brother    Diabetes Maternal Grandfather    Hypertension Maternal Grandfather    Black cancer Maternal Grandfather 74   Cancer Maternal Grandfather    Breast cancer Paternal Grandfather    Learning disabilities Daughter    Breast cancer Maternal Aunt 58   Cancer Maternal Aunt    Early death Maternal Aunt     Social History   Tobacco Use   Smoking status: Never   Smokeless tobacco: Never  Vaping Use   Vaping status: Some Days  Substance Use Topics   Alcohol use: Yes    Comment: social   Drug use: No     Allergies  Allergen Reactions   Penicillins Other (See Comments), Swelling, Rash and Itching    Reaction:  Arm/leg swelling   Has patient had a PCN reaction causing immediate rash, facial/tongue/throat swelling, SOB or lightheadedness with hypotension: Yes  Has patient had a PCN reaction causing severe rash involving mucus membranes or skin necrosis: No  Has patient had a PCN reaction that required hospitalization: No  Has patient had a PCN reaction occurring within the last 10 years: No  If all of the above answers are NO, then may proceed with Cephalosporin use.  Reaction:  Arm/leg swelling  Has patient had a PCN reaction causing immediate rash,  facial/tongue/throat swelling, SOB or lightheadedness with hypotension: Yes Has patient had a PCN reaction causing severe rash involving mucus membranes or skin necrosis: No Has patient had a PCN reaction that required hospitalization: No Has patient had a PCN reaction occurring within the last 10 years: No If all of the above answers are NO, then may proceed with Cephalosporin use.    Leg went numb when younger.    Leg went numb when younger. Reaction:  Arm/leg swelling  Has patient had a PCN reaction causing immediate rash, facial/tongue/throat swelling, SOB or lightheadedness with hypotension: Yes Has patient had a PCN reaction causing severe rash involving mucus membranes or skin necrosis: No Has patient had a PCN reaction that required hospitalization: No  Has patient had a PCN reaction occurring within the last 10 years: No If all of the above answers are NO, then may proceed with Cephalosporin use.   Shellfish Allergy Other (See Comments), Swelling and Itching    Localized swelling   Shellfish-Derived Products     Other Reaction(s): edema    Review of Systems NEGATIVE UNLESS OTHERWISE INDICATED IN HPI      Objective:     BP 112/62 (BP Location: Left Arm, Patient Position: Sitting, Cuff Size: Normal)   Pulse 80   Temp 98.2 F (36.8 C) (Temporal)   Ht 5' 4 (1.626 m)   Wt 198 lb 9.6 oz (90.1 kg)   LMP 10/29/2023 (Exact Date)   SpO2 98%   BMI 34.09 kg/m   Wt Readings from Last 3 Encounters:  11/01/23 198 lb 9.6 oz (90.1 kg)  09/20/23 200 lb 6.4 oz (90.9 kg)  02/15/23 208 lb 9.6 oz (94.6 kg)    BP Readings from Last 3 Encounters:  11/01/23 112/62  09/20/23 124/82  02/15/23 98/64     Physical Exam Vitals and nursing note reviewed.  Constitutional:      Appearance: Normal appearance. She is normal weight. She is not toxic-appearing.  HENT:     Head: Normocephalic and atraumatic.     Right Ear: External ear normal.     Left Ear: External ear normal.  Eyes:      Extraocular Movements: Extraocular movements intact.     Conjunctiva/sclera: Conjunctivae normal.     Pupils: Pupils are equal, round, and reactive to light.  Cardiovascular:     Rate and Rhythm: Normal rate and regular rhythm.     Pulses: Normal pulses.     Heart sounds: Normal heart sounds.  Pulmonary:     Effort: Pulmonary effort is normal.     Breath sounds: Normal breath sounds.  Musculoskeletal:        General: Normal range of motion.     Cervical back: Normal range of motion and neck supple.  Skin:    General: Skin is warm and dry.  Neurological:     General: No focal deficit present.     Mental Status: She is alert and oriented to person, place, and time.  Psychiatric:        Mood and Affect: Mood normal.        Behavior: Behavior normal.             Mirabelle Cyphers M Naeema Patlan, PA-C

## 2023-11-03 ENCOUNTER — Telehealth: Payer: Self-pay

## 2023-11-03 ENCOUNTER — Ambulatory Visit: Payer: Self-pay | Admitting: Physician Assistant

## 2023-11-03 NOTE — Telephone Encounter (Signed)
 Copied from CRM [id number]. Topic: General - Billing Inquiry >> Nov 02, 2023  2:51 PM Franky GRADE wrote: Reason for CRM: [facility] is calling regarding office visit on 09/20/2023 where they did a genomic panel and informed patient insurance would cover, Patient is receiving a $5500 bill due to the lab where it was sent to does not take patient's insurance. She would have never done the test if it was not covered by insurance. Provider line for the insurance is [phone].  Please see msg above requesting PCP return call on provider line regarding pt recent testing.

## 2023-11-04 ENCOUNTER — Other Ambulatory Visit: Payer: Self-pay

## 2023-11-04 DIAGNOSIS — E559 Vitamin D deficiency, unspecified: Secondary | ICD-10-CM

## 2023-11-04 DIAGNOSIS — R7989 Other specified abnormal findings of blood chemistry: Secondary | ICD-10-CM

## 2023-11-04 MED ORDER — VITAMIN D (ERGOCALCIFEROL) 1.25 MG (50000 UNIT) PO CAPS: 50000.0000 [IU] | ORAL_CAPSULE | ORAL | 0 refills | Status: DC

## 2023-11-08 NOTE — Telephone Encounter (Signed)
 Please see patient response per [facility]

## 2023-11-10 ENCOUNTER — Ambulatory Visit: Admitting: Physician Assistant

## 2023-11-19 NOTE — Telephone Encounter (Signed)
 Currently waiting for a return call from [facility] regarding patient billing.   I have informed patient.  I have advised I will follow back up on Monday. Patient understood.

## 2023-11-19 NOTE — Telephone Encounter (Signed)
 I have left VM for Regional Manager Bartt Doll to call me back regarding patient billing.

## 2023-11-19 NOTE — Telephone Encounter (Signed)
 Please see pt message in regards to pricing and billing for [facility]

## 2023-11-29 ENCOUNTER — Telehealth: Admitting: Physician Assistant

## 2023-11-29 NOTE — Telephone Encounter (Signed)
 I have spoken with Regional Manger.  Patient will not be responsible for bill.  She can disregard.

## 2023-12-01 ENCOUNTER — Other Ambulatory Visit: Payer: Self-pay | Admitting: Physician Assistant

## 2023-12-01 DIAGNOSIS — F32A Depression, unspecified: Secondary | ICD-10-CM

## 2023-12-06 ENCOUNTER — Encounter: Payer: Self-pay | Admitting: Physician Assistant

## 2023-12-17 ENCOUNTER — Ambulatory Visit: Admitting: Physician Assistant

## 2023-12-23 ENCOUNTER — Ambulatory Visit: Admitting: Family Medicine

## 2023-12-23 ENCOUNTER — Encounter: Payer: Self-pay | Admitting: Physician Assistant

## 2023-12-23 ENCOUNTER — Ambulatory Visit: Payer: Self-pay

## 2023-12-23 ENCOUNTER — Encounter: Payer: Self-pay | Admitting: Family Medicine

## 2023-12-23 VITALS — BP 110/60 | HR 90 | Temp 97.9°F | Ht 64.0 in | Wt 198.4 lb

## 2023-12-23 DIAGNOSIS — J020 Streptococcal pharyngitis: Secondary | ICD-10-CM

## 2023-12-23 LAB — POCT RAPID STREP A (OFFICE): Rapid Strep A Screen: POSITIVE — AB

## 2023-12-23 MED ORDER — AZITHROMYCIN 250 MG PO TABS: ORAL_TABLET | ORAL | 0 refills | Status: AC

## 2023-12-23 NOTE — Patient Instructions (Signed)
 Bland foods, noodles,rice, toast.  Avoid milk products.  Antibiotics sent

## 2023-12-23 NOTE — Telephone Encounter (Signed)
  Message from Har[facility] ORN sent at 12/23/2023  8:10 AM EST  Reason for Triage: throat hurts and headache, fatigue and vomiting; symptoms began on Sunday   Call placed to patient. Pt seen by PCP today to address symptoms. Diagnosed with strep, treating with abx

## 2023-12-23 NOTE — Progress Notes (Signed)
 Subjective:     Patient ID: Kelsey Black, female    DOB: 1990-12-14, 33 y.o.   MRN: [id number]  Chief Complaint  Patient presents with   Sore Throat    Pt states sore throat, diarrhea, headache, no fever but did have chills     Discussed the use of AI scribe software for clinical note transcription with the patient, who gave verbal consent to proceed.  History of Present Illness Kelsey Black is a 33 year old female who presents with a sore throat, vomiting, and diarrhea.  She began experiencing a sore throat on Monday, describing it as 'on fire.' She has not tested for COVID-19, but her daughter tested negative. She also reports chills, headache, and stomach pain associated with her current illness.  Diarrhea started on Sunday and persists as of this morning. Vomiting also began on Sunday but has improved, allowing her to consume fruits and Gatorade. Initially, she was unable to retain any food or fluids, including water and ginger ale.  Her daughter was diagnosed with strep throat on October 20th and treated with augmentin She suspects she contracted the illness from her daughter after sharing a cup. Her daughter also had a stomach virus requiring Zofran  before taking medication.  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 No fever, but she reports chills, sore throat, headache, vomiting, and diarrhea.    There are no preventive care reminders to display for this patient.   Past Medical History:  Diagnosis Date   Allergy    Anemia    Anxiety and depression 03/12/2022   Chorioamnionitis 2019   Clotting disorder 08/2020   Black polyps    Depression    Endometriosis    Family history of adverse reaction to anesthesia    mother woke up during abdominal surgery 3 years ago   Family history of Black cancer 04/04/2021   Nausea & vomiting    last month   Pregnancy    19 weeks and few  days michelle horvath ob dr   Symptomatic cholelithiasis     Past Surgical History:  Procedure Laterality Date   CHOLECYSTECTOMY N/A 11/16/2016   Procedure: LAPAROSCOPIC CHOLECYSTECTOMY;  Surgeon: Teresa Lonni HERO, MD;  Location: [facility];  Service: General;  Laterality: N/A;   COLONOSCOPY W/ POLYPECTOMY  2023   EYE SURGERY  2022   HEMORRHOID SURGERY N/A 05/13/2021   Procedure: INTERNAL AND EXTERNAL HEMORRHOIDECTOMY;  Surgeon: Sebastian Moles, MD;  Location: [facility];  Service: General;  Laterality: N/A;   TOOTH EXTRACTION       Current Outpatient Medications:    acetaminophen  (TYLENOL ) 500 MG tablet, Take 1,000 mg by mouth every 6 (six) hours as needed for mild pain., Disp: , Rfl:    azithromycin  (ZITHROMAX ) 250 MG tablet, Take 2 tablets on day 1, then 1 tablet daily on days 2 through 5, Disp: 6 tablet, Rfl: 0   desvenlafaxine  (PRISTIQ ) 25 MG 24 hr tablet, Take 1 tablet by mouth once daily, Disp: 30 tablet, Rfl: 0   EPINEPHrine  0.3 mg/0.3 mL IJ SOAJ injection, Inject 0.3 mg into the muscle as needed for anaphylaxis., Disp: 1 each, Rfl: 2   lidocaine  (XYLOCAINE ) 5 % ointment, Apply 1 Application topically 3 (three) times daily as needed for mild pain (pain score 1-3)., Disp: , Rfl:    Norethindrone  Acetate-Ethinyl Estrad-FE (LARIN  24 FE) 1-20 MG-MCG(24) tablet, Take 1 tablet by mouth daily., Disp: ,  Rfl:    Vitamin D , Ergocalciferol , (DRISDOL ) 1.25 MG (50000 UNIT) CAPS capsule, Take 1 capsule (50,000 Units total) by mouth every 7 (seven) days., Disp: 12 capsule, Rfl: 0  Allergies  Allergen Reactions   Penicillins Other (See Comments), Swelling, Rash and Itching    Reaction:  Arm/leg swelling   Has patient had a PCN reaction causing immediate rash, facial/tongue/throat swelling, SOB or lightheadedness with hypotension: Yes  Has patient had a PCN reaction causing severe rash involving mucus membranes or skin necrosis: No  Has patient had a PCN reaction that required hospitalization:  No  Has patient had a PCN reaction occurring within the last 10 years: No  If all of the above answers are NO, then may proceed with Cephalosporin use.  Reaction:  Arm/leg swelling  Has patient had a PCN reaction causing immediate rash, facial/tongue/throat swelling, SOB or lightheadedness with hypotension: Yes Has patient had a PCN reaction causing severe rash involving mucus membranes or skin necrosis: No Has patient had a PCN reaction that required hospitalization: No Has patient had a PCN reaction occurring within the last 10 years: No If all of the above answers are NO, then may proceed with Cephalosporin use.    Leg went numb when younger.    Leg went numb when younger. Reaction:  Arm/leg swelling  Has patient had a PCN reaction causing immediate rash, facial/tongue/throat swelling, SOB or lightheadedness with hypotension: Yes Has patient had a PCN reaction causing severe rash involving mucus membranes or skin necrosis: No Has patient had a PCN reaction that required hospitalization: No Has patient had a PCN reaction occurring within the last 10 years: No If all of the above answers are NO, then may proceed with Cephalosporin use.   Shellfish Allergy Other (See Comments), Swelling and Itching    Localized swelling   Shellfish Protein-Containing Drug Products     Other Reaction(s): edema   ROS neg/noncontributory except as noted HPI/below      Objective:     BP 110/60 (BP Location: Left Arm, Patient Position: Sitting, Cuff Size: Large)   Pulse 90   Temp 97.9 F (36.6 C) (Temporal)   Ht 5' 4 (1.626 m)   Wt 198 lb 6 oz (90 kg)   LMP 11/27/2023   SpO2 97%   BMI 34.05 kg/m  Wt Readings from Last 3 Encounters:  12/23/23 198 lb 6 oz (90 kg)  11/01/23 198 lb 9.6 oz (90.1 kg)  09/20/23 200 lb 6.4 oz (90.9 kg)    Physical Exam VITALS: BP- 110/60 GENERAL: Well developed, well nourished, no acute distress. HEAD EYES EARS NOSE THROAT: Normocephalic, atraumatic, conjunctiva  not injected, sclera nonicteric, ears clear, pharynx clear, moist, no exudates. CARDIAC: Regular rate and rhythm, S1 S2 present, no murmur,. NECK: Supple, no thyromegaly, small submandibular nodes, . LUNGS: Clear to auscultation bilaterally, no wheezes. ABDOMEN: Bowel sounds present, soft, non-tender, non-distended, no hepatosplenomegaly, no masses. EXTREMITIES: No edema. MUSCULOSKELETAL: No gross abnormalities. NEUROLOGICAL: Alert and oriented x3, cranial nerves II through XII intact. PSYCHIATRIC: Normal mood, good eye contact.  Results for orders placed or performed in visit on 12/23/23  POCT rapid strep A   Collection Time: 12/23/23  9:55 AM  Result Value Ref Range   Rapid Strep A Screen Positive (A) Negative          Assessment & Plan:  Strep throat  Other orders -     Azithromycin ; Take 2 tablets on day 1, then 1 tablet daily on days 2 through 5  Dispense: 6 tablet; Refill: 0    Assessment and Plan Assessment & Plan Streptococcal pharyngitis   She has experienced a sore throat since Monday, accompanied by chills and headache. Her daughter was diagnosed with strep on October 20th. A COVID test was negative. . +streptococcal pharyngitis . She is allergic to penicillin, experiencing severe reactions such as rash, itching, and throat swelling. Prescribed Z-Pak (azithromycin ) due to this allergy. Advised to stay home from work until 24 hours after starting antibiotics. Instructed to wear a mask and practice good hand hygiene to prevent spread.  Acute gastroenteritis   She has had diarrhea and vomiting since Sunday, likely viral due to recent exposure from her daughter. Initially unable to keep food or fluids down, now improved with bland foods and fluids. Contagious through respiratory and contact routes. Advised to maintain hydration by sipping fluids. Recommended a bland diet and avoidance of solid foods until symptoms improve. Instructed to practice good hand hygiene and wear a  mask to prevent spread.     Return if symptoms worsen or fail to improve.  Jenkins CHRISTELLA Carrel, MD

## 2023-12-30 ENCOUNTER — Ambulatory Visit: Admitting: Family

## 2024-01-18 ENCOUNTER — Other Ambulatory Visit: Payer: Self-pay | Admitting: Physician Assistant

## 2024-01-18 ENCOUNTER — Encounter: Payer: Self-pay | Admitting: Physician Assistant

## 2024-01-18 ENCOUNTER — Other Ambulatory Visit: Payer: Self-pay

## 2024-01-18 DIAGNOSIS — R634 Abnormal weight loss: Secondary | ICD-10-CM

## 2024-01-18 DIAGNOSIS — F419 Anxiety disorder, unspecified: Secondary | ICD-10-CM

## 2024-01-18 NOTE — Telephone Encounter (Signed)
Please advise on referral for pt?

## 2024-01-31 ENCOUNTER — Ambulatory Visit: Admitting: Nurse Practitioner

## 2024-01-31 ENCOUNTER — Encounter: Payer: Self-pay | Admitting: Nurse Practitioner

## 2024-01-31 VITALS — BP 99/67 | HR 77 | Temp 98.3°F | Ht 64.5 in | Wt 201.0 lb

## 2024-01-31 DIAGNOSIS — E66811 Obesity, class 1: Secondary | ICD-10-CM

## 2024-01-31 DIAGNOSIS — Z6833 Body mass index (BMI) 33.0-33.9, adult: Secondary | ICD-10-CM

## 2024-01-31 DIAGNOSIS — E559 Vitamin D deficiency, unspecified: Secondary | ICD-10-CM

## 2024-01-31 NOTE — Progress Notes (Signed)
 Office: [phone]  /  Fax: [phone]   Initial Visit  Adalin Vanderploeg Colon was seen in clinic today to evaluate for obesity. She is interested in losing weight to improve overall health and reduce the risk of weight related complications. She presents today to review program treatment options, initial physical assessment, and evaluation.     She was referred by: PCP  When asked what else they would like to accomplish? She states: Adopt a healthier eating pattern and lifestyle, Improve energy levels and physical activity, Improve existing medical conditions, and Improve quality of life  When asked how has your weight affected you? She states: Having fatigue and Having poor endurance  Some associated conditions: anemia, anxiety, depression, Vit d def  Contributing factors: family history of obesity, consumption of processed foods, use of obesogenic medications: Contraceptives or hormonal therapy, moderate to high levels of stress, and chronic skipping of meals  Weight promoting medications identified: Contraceptives or hormonal therapy  Current nutrition plan: None  Current level of physical activity: None  Current or previous pharmacotherapy: Phentermine  Response to medication: stopped due to side effects of depression    Past medical history includes:   Past Medical History:  Diagnosis Date   Allergy    Anemia    Anxiety and depression 03/12/2022   Chorioamnionitis 2019   Clotting disorder 08/2020   Colon polyps    Depression    Endometriosis    Family history of adverse reaction to anesthesia    mother woke up during abdominal surgery 3 years ago   Family history of colon cancer 04/04/2021   Nausea & vomiting    last month   Pregnancy    19 weeks and few days michelle hor[facility]ath ob dr   Symptomatic cholelithiasis      Objective:   BP 99/67   Pulse 77   Temp 98.3 F (36.8 C)   Ht 5' 4.5 (1.638 m)   Wt 201 lb (91.2 kg)   LMP  (LMP Unknown)   SpO2 97%    BMI 33.97 kg/m  She was weighed on the bioimpedance scale: Body mass index is 33.97 kg/m.  Peak Weight:23os+ lbs , Body Fat%:43.3%, Visceral Fat Rating:9, Weight trend over the last 12 months: fluctuated    General:  Alert, oriented and cooperative. Patient is in no acute distress.  Respiratory: Normal respiratory effort, no problems with respiration noted   Gait: able to ambulate independently  Mental Status: Normal mood and affect. Normal behavior. Normal judgment and thought content.   DIAGNOSTIC DATA REVIEWED:  BMET    Component Value Date/Time   NA 138 11/01/2023 1435   K 3.9 11/01/2023 1435   CL 104 11/01/2023 1435   CO2 28 11/01/2023 1435   GLUCOSE 100 (H) 11/01/2023 1435   BUN 11 11/01/2023 1435   CREATININE 0.50 11/01/2023 1435   CREATININE 0.78 05/19/2015 1322   CALCIUM 9.0 11/01/2023 1435   GFRNONAA >60 07/26/2021 1257   GFRAA >60 06/07/2019 0436   No results found for: HGBA1C No results found for: INSULIN CBC    Component Value Date/Time   WBC 6.7 11/01/2023 1435   RBC 4.76 11/01/2023 1435   HGB 11.6 (L) 11/01/2023 1435   HCT 36.5 11/01/2023 1435   PLT 372.0 11/01/2023 1435   MCV 76.8 (L) 11/01/2023 1435   MCV 84.4 05/19/2015 1343   MCH 26.5 07/26/2021 1257   MCHC 31.6 11/01/2023 1435   RDW 15.6 (H) 11/01/2023 1435   Iron/TIBC/Ferritin/ %Sat  Component Value Date/Time   IRON 23 (L) 11/01/2023 1435   TIBC 302.4 11/01/2023 1435   FERRITIN 30.0 11/01/2023 1435   IRONPCTSAT 7.6 (L) 11/01/2023 1435   IRONPCTSAT 25 03/12/2022 1036   Lipid Panel     Component Value Date/Time   CHOL 135 03/12/2022 1036   TRIG 80.0 03/12/2022 1036   HDL 44.20 03/12/2022 1036   CHOLHDL 3 03/12/2022 1036   VLDL 16.0 03/12/2022 1036   LDLCALC 75 03/12/2022 1036   Hepatic Function Panel     Component Value Date/Time   PROT 7.0 11/01/2023 1435   ALBUMIN 3.9 11/01/2023 1435   AST 15 11/01/2023 1435   ALT 15 11/01/2023 1435   ALKPHOS 94 11/01/2023 1435    BILITOT 0.3 11/01/2023 1435      Component Value Date/Time   TSH 0.00 Repeated and verified X2. (L) 11/01/2023 1435     Assessment and Plan:   Vitamin D  deficiency Patient is not currently taking Vit D Will recheck labs at next visit  Obesity, Class I, BMI 30-34.9        Obesity Treatment / Action Plan:  Patient will work on garnering support from family and friends to begin weight loss journey. Will work on eliminating or reducing the presence of highly palatable, calorie dense foods in the home. Will complete provided nutritional and psychosocial assessment questionnaire before the next appointment. Will be scheduled for indirect calorimetry to determine resting energy expenditure in a fasting state.  This will allow us  to create a reduced calorie, high-protein meal plan to promote loss of fat mass while preserving muscle mass. Counseled on the health benefits of losing 5%-15% of total body weight. Was counseled on nutritional approaches to weight loss and benefits of reducing processed foods and consuming plant-based foods and high quality protein as part of nutritional weight management. Was counseled on pharmacotherapy and role as an adjunct in weight management.   Obesity Education Performed Today:  She was weighed on the bioimpedance scale and results were discussed and documented in the synopsis.  We discussed obesity as a disease and the importance of a more detailed evaluation of all the factors contributing to the disease.  We discussed the importance of long term lifestyle changes which include nutrition, exercise and behavioral modifications as well as the importance of customizing this to her specific health and social needs.  We discussed the benefits of reaching a healthier weight to alleviate the symptoms of existing conditions and reduce the risks of the biomechanical, metabolic and psychological effects of obesity.  Malayzia Mercado Colon appears to be in  the action stage of change and states they are ready to start intensive lifestyle modifications and behavioral modifications.  30 minutes was spent today on this visit including the above counseling, pre-visit chart review, and post-visit documentation.  Reviewed by clinician on day of visit: allergies, medications, problem list, medical history, surgical history, family history, social history, and previous encounter notes pertinent to obesity diagnosis.    Corean SAUNDERS Daviona Herbert FNP-C

## 2024-03-06 ENCOUNTER — Encounter: Payer: Self-pay | Admitting: Bariatrics

## 2024-03-06 ENCOUNTER — Ambulatory Visit: Admitting: Bariatrics

## 2024-03-06 VITALS — BP 105/63 | HR 69 | Ht 64.5 in | Wt 204.0 lb

## 2024-03-06 DIAGNOSIS — Z1331 Encounter for screening for depression: Secondary | ICD-10-CM | POA: Diagnosis not present

## 2024-03-06 DIAGNOSIS — E66811 Obesity, class 1: Secondary | ICD-10-CM

## 2024-03-06 DIAGNOSIS — Z862 Personal history of diseases of the blood and blood-forming organs and certain disorders involving the immune mechanism: Secondary | ICD-10-CM

## 2024-03-06 DIAGNOSIS — E669 Obesity, unspecified: Secondary | ICD-10-CM

## 2024-03-06 DIAGNOSIS — Z6834 Body mass index (BMI) 34.0-34.9, adult: Secondary | ICD-10-CM

## 2024-03-06 DIAGNOSIS — R5383 Other fatigue: Secondary | ICD-10-CM | POA: Diagnosis not present

## 2024-03-06 DIAGNOSIS — R0602 Shortness of breath: Secondary | ICD-10-CM

## 2024-03-06 DIAGNOSIS — D508 Other iron deficiency anemias: Secondary | ICD-10-CM

## 2024-03-06 DIAGNOSIS — Z0000 Encounter for general adult medical examination without abnormal findings: Secondary | ICD-10-CM

## 2024-03-06 NOTE — Progress Notes (Signed)
 "  At a Glance:  Vitals BP: 105/63 Pulse Rate: 69 SpO2: 98 %   Anthropometric Measurements Height: 5' 4.5 (1.638 m) Weight: 204 lb (92.5 kg) BMI (Calculated): 34.49 Starting Weight: 204lb Peak Weight: 230lb   Body Composition  Body Fat %: 42.9 % Fat Mass (lbs): 87.6 lbs Muscle Mass (lbs): 110.8 lbs Total Body Water (lbs): 79.4 lbs Visceral Fat Rating : 9   Other Clinical Data RMR: 1757 Fasting: yes Labs: yes Today's Visit #: 1 Starting Date: 03/06/24    EKG: Normal sinus rhythm, rate 65.  Indirect Calorimeter:   Resting Metabolic Rate ( RMR):  RMR (actual): 1757 kcal RMR (calculated): 1649 kcal The calculated basal metabolic rate is 8,350 thus her basal metabolic rate is better than expected.  Plan:   Indirect calorimeter completed, interpreted and reviewed with patient today and allowed to ask questions.  Discussed the implications for the chosen plan and exercise based on the RMR reading.  Will consider repeating the RMR in the future based on weight loss.    Chief Complaint:  Obesity   Subjective:  Seryna Marek Colon (MR# [id number]) is a 34 y.o. female who presents for evaluation and treatment of obesity and related comorbidities.   Allora is currently in the action stage of change and ready to dedicate time achieving and maintaining a healthier weight. Ryen is interested in becoming our patient and working on intensive lifestyle modifications including (but not limited to) diet and exercise for weight loss.  Leea has been struggling with her weight. She has been unsuccessful in either losing weight, maintaining weight loss, or reaching her healthy weight goal.  Jadira's habits were reviewed today and are as follows: Her family eats meals together, she started gaining weight after the birth of her daughter, her heaviest weight ever was 240 pounds, she is a picky eater and doesn't like to eat healthier foods, she has significant  food cravings issues, she snacks frequently in the evenings, she skips meals frequently, she frequently makes poor food choices, she has problems with excessive hunger, and she frequently eats larger portions than normal.  Current or previous pharmacotherapy: Phentermine, interested in medications for the future.  Response to medication: Had side effects so it was discontinued (depression).   Other Fatigue Mykala admits to daytime somnolence and admits to waking up still tired.  Fabiha generally gets 5 hours of sleep per night, and states that she has generally restful sleep. Snoring is not present. Apneic episodes are not present. Epworth Sleepiness Score is 7.   Shortness of Breath Farrell notes increasing shortness of breath with exercising and seems to be worsening over time with weight gain. She notes getting out of breath sooner with activity than she used to. This has not gotten worse recently. Marilene denies shortness of breath at rest or orthopnea.  Depression Screen Wava's Food and Mood (modified PHQ-9) score was 16. 15-19 moderate severe depression     03/06/2024    7:54 AM  Depression screen PHQ 2/9  Decreased Interest 1  Down, Depressed, Hopeless 1  PHQ - 2 Score 2  Altered sleeping 2  Tired, decreased energy 3  Change in appetite 3  Feeling bad or failure about yourself  0  Trouble concentrating 1  Moving slowly or fidgety/restless 3  Suicidal thoughts 0  PHQ-9 Score 14  Difficult doing work/chores Somewhat difficult     Assessment and Plan:   Other Fatigue Zena does feel that her weight is causing her energy to be  lower than it should be. Fatigue may be related to obesity, depression or many other causes. Labs will be ordered, and in the meanwhile, Donta will focus on self care including making healthy food choices, increasing physical activity and focusing on stress reduction.  Shortness of Breath Sibbie does not feel that she gets  out of breath more easily that she used to when she exercises. Rosalin's shortness of breath appears to be obesity related and exercise induced. She has agreed to work on weight loss and gradually increase exercise to treat her exercise induced shortness of breath. Will continue to monitor closely.  Health Maintenance:   Obesity   Plan: Will do EKG, indirect calorimetry, and labs.   Will begin the plan and exercise.  Will use one of the apps such as My Fitness Pal or Lose It app.   Vitamin D  Deficiency Vitamin D  is not at goal of 50.  Most recent vitamin D  level was 14.34. She is at risk for vitamin D  deficiency due to obesity.  Lab Results  Component Value Date   VD25OH 14.34 (L) 11/01/2023   VD25OH 15.75 (L) 03/12/2022   VD25OH 23.95 (L) 11/11/2021    Plan: Will check for vitamin D  deficiency.   History of iron deficiency anemia: She states that she does not know the etiology of her anemia but she has been on iron in the past.  She is not currently taking any iron.  She does have some fatigue currently.  Denies any pica.   Plan: Will check an anemia panel and ferritin level.  Taimane had a positive depression screening. Depression is commonly associated with obesity and often results in emotional eating behaviors. We will monitor this closely and work on CBT to help improve the non-hunger eating patterns. Referral to Psychology may be required if no improvement is seen as she continues in our clinic.    Previous labs reviewed today. Date: 11/01/23 Iron and anemia panel, ferritin, vitamin D , B12, CBCBMP,and glucose   Labs done today CMP, Lipids, Insulin , HgbA1c, Vit D, Thyroid  Panel, Anemia Panel, and Ferritin   Generalized Obesity: BMI (Calculated): 34.49   Veena is currently in the action stage of change and her goal is to begin weight loss efforts. I recommend Refugio begin the structured treatment plan as follows:  She has agreed to Category 2 Plan and  keeping a food journal and adhering to recommended goals of 1,300 calories and 80 to 90 protein  Exercise goals: All adults should avoid inactivity. Some activity is better than none, and adults who participate in any amount of physical activity, gain some health benefits.  Behavioral modification strategies:increasing lean protein intake, decreasing simple carbohydrates, increasing vegetables, increase H2O intake, increase high fiber foods, decreasing eating out, no skipping meals, better snacking choices, emotional eating strategies , avoiding temptations, and planning for success  She was informed of the importance of frequent follow-up visits to maximize her success with intensive lifestyle modifications for her multiple health conditions. She was informed we would discuss her lab results at her next visit unless there is a critical issue that needs to be addressed sooner. Marjie agreed to keep her next visit at the agreed upon time to discuss these results.  Objective:  General: Cooperative, alert, well developed, in no acute distress. HEENT: Conjunctivae and lids unremarkable. Cardiovascular: Regular rhythm.  Lungs: Normal work of breathing. Neurologic: No focal deficits.   Lab Results  Component Value Date   CREATININE 0.50 11/01/2023   BUN 11  11/01/2023   NA 138 11/01/2023   K 3.9 11/01/2023   CL 104 11/01/2023   CO2 28 11/01/2023   Lab Results  Component Value Date   ALT 15 11/01/2023   AST 15 11/01/2023   ALKPHOS 94 11/01/2023   BILITOT 0.3 11/01/2023   No results found for: HGBA1C No results found for: INSULIN  Lab Results  Component Value Date   TSH 0.00 Repeated and verified X2. (L) 11/01/2023   Lab Results  Component Value Date   CHOL 135 03/12/2022   HDL 44.20 03/12/2022   LDLCALC 75 03/12/2022   TRIG 80.0 03/12/2022   CHOLHDL 3 03/12/2022   Lab Results  Component Value Date   WBC 6.7 11/01/2023   HGB 11.6 (L) 11/01/2023   HCT 36.5 11/01/2023    MCV 76.8 (L) 11/01/2023   PLT 372.0 11/01/2023   Lab Results  Component Value Date   IRON 23 (L) 11/01/2023   TIBC 302.4 11/01/2023   FERRITIN 30.0 11/01/2023    Attestation Statements:  Applicable history such as the following:  allergies, medications, problem list, medical history, surgical history, family history, social history, and previous encounter notes reviewed by clinician on day of visit:  Time spent on visit in care of the patient today including the items listed below was 40 minutes.    20 minutes were spent talking about the history, 20 minutes for face to face counseling implementing the plan, discussing the specifics of how to arrange meals, meal planning, water intake.   I spent face to face time discussing his/her plan, including breakfast, additional breakfast options, lunch, and dinner options, grocery list, and snacks.  I reviewed her indirect calorimetry. I discussed the implications for the diet plan.    Discussed the bio-impedence test (fat %, muscle mass, and water weight) and allowed the patient to ask questions.   Discussed the following information sheets: Category 2, Grocery List, 100 Calorie Snacks, 200 Calorie Snacks, and Microwave Meals.   I reviewed the labs which were ordered from her visit on 11/01/2023. ,   I additionally spent time documenting, reviewing, and checking the codes before submitting.   This may have been prepared with the assistance of Engineer, Civil (consulting).  Occasional wrong-word or sound-a-like substitutions may have occurred due to the inherent limitations of voice recognition software.    Clayborne Daring, DO   "

## 2024-03-07 ENCOUNTER — Encounter (INDEPENDENT_AMBULATORY_CARE_PROVIDER_SITE_OTHER): Payer: Self-pay | Admitting: Bariatrics

## 2024-03-07 DIAGNOSIS — E88819 Insulin resistance, unspecified: Secondary | ICD-10-CM | POA: Insufficient documentation

## 2024-03-07 LAB — LIPID PANEL WITH LDL/HDL RATIO
Cholesterol, Total: 139 mg/dL (ref 100–199)
HDL: 53 mg/dL
LDL Chol Calc (NIH): 75 mg/dL (ref 0–99)
LDL/HDL Ratio: 1.4 ratio (ref 0.0–3.2)
Triglycerides: 49 mg/dL (ref 0–149)
VLDL Cholesterol Cal: 11 mg/dL (ref 5–40)

## 2024-03-07 LAB — COMPREHENSIVE METABOLIC PANEL WITH GFR
ALT: 20 IU/L (ref 0–32)
AST: 19 IU/L (ref 0–40)
Albumin: 4.1 g/dL (ref 3.9–4.9)
Alkaline Phosphatase: 127 IU/L — ABNORMAL HIGH (ref 41–116)
BUN/Creatinine Ratio: 15 (ref 9–23)
BUN: 8 mg/dL (ref 6–20)
Bilirubin Total: 0.3 mg/dL (ref 0.0–1.2)
CO2: 23 mmol/L (ref 20–29)
Calcium: 8.9 mg/dL (ref 8.7–10.2)
Chloride: 103 mmol/L (ref 96–106)
Creatinine, Ser: 0.53 mg/dL — ABNORMAL LOW (ref 0.57–1.00)
Globulin, Total: 2.9 g/dL (ref 1.5–4.5)
Glucose: 83 mg/dL (ref 70–99)
Potassium: 4.8 mmol/L (ref 3.5–5.2)
Sodium: 137 mmol/L (ref 134–144)
Total Protein: 7 g/dL (ref 6.0–8.5)
eGFR: 125 mL/min/1.73

## 2024-03-07 LAB — FE+TIBC+FER+B12+FOLIC+RETIC
Ferritin: 29 ng/mL (ref 15–150)
Folate: 6.9 ng/mL
Iron Saturation: 9 % — CL (ref 15–55)
Iron: 29 ug/dL (ref 27–159)
Retic Ct Pct: 1.9 % (ref 0.6–2.6)
Total Iron Binding Capacity: 308 ug/dL (ref 250–450)
UIBC: 279 ug/dL (ref 131–425)
Vitamin B-12: 500 pg/mL (ref 232–1245)

## 2024-03-07 LAB — TSH+T4F+T3FREE
Free T4: 0.74 ng/dL — ABNORMAL LOW (ref 0.82–1.77)
T3, Free: 2.4 pg/mL (ref 2.0–4.4)
TSH: 0.444 u[IU]/mL — ABNORMAL LOW (ref 0.450–4.500)

## 2024-03-07 LAB — HEMOGLOBIN A1C
Est. average glucose Bld gHb Est-mCnc: 91 mg/dL
Hgb A1c MFr Bld: 4.8 % (ref 4.8–5.6)

## 2024-03-07 LAB — INSULIN, RANDOM: INSULIN: 27.4 u[IU]/mL — ABNORMAL HIGH (ref 2.6–24.9)

## 2024-03-07 LAB — VITAMIN D 25 HYDROXY (VIT D DEFICIENCY, FRACTURES): Vit D, 25-Hydroxy: 14.7 ng/mL — ABNORMAL LOW (ref 30.0–100.0)

## 2024-03-08 ENCOUNTER — Encounter: Payer: Self-pay | Admitting: Physician Assistant

## 2024-03-08 NOTE — Telephone Encounter (Signed)
FYI see pt reply

## 2024-03-08 NOTE — Telephone Encounter (Signed)
 Please see pt msg and advise

## 2024-03-16 ENCOUNTER — Ambulatory Visit: Admitting: Family

## 2024-03-16 ENCOUNTER — Encounter: Payer: Self-pay | Admitting: Family

## 2024-03-16 VITALS — BP 122/70 | HR 105 | Temp 99.7°F | Ht 64.5 in | Wt 212.1 lb

## 2024-03-16 DIAGNOSIS — J029 Acute pharyngitis, unspecified: Secondary | ICD-10-CM

## 2024-03-16 DIAGNOSIS — R051 Acute cough: Secondary | ICD-10-CM

## 2024-03-16 DIAGNOSIS — J329 Chronic sinusitis, unspecified: Secondary | ICD-10-CM

## 2024-03-16 DIAGNOSIS — B9789 Other viral agents as the cause of diseases classified elsewhere: Secondary | ICD-10-CM

## 2024-03-16 DIAGNOSIS — M791 Myalgia, unspecified site: Secondary | ICD-10-CM

## 2024-03-16 LAB — POC COVID19 BINAXNOW: SARS Coronavirus 2 Ag: NEGATIVE

## 2024-03-16 LAB — POCT INFLUENZA A/B
Influenza A, POC: NEGATIVE
Influenza B, POC: NEGATIVE

## 2024-03-16 LAB — POCT RAPID STREP A (OFFICE): Rapid Strep A Screen: NEGATIVE

## 2024-03-16 MED ORDER — TRIAMCINOLONE ACETONIDE 55 MCG/ACT NA AERO: 1.0000 | INHALATION_SPRAY | Freq: Every day | NASAL | 2 refills | Status: AC

## 2024-03-16 MED ORDER — NAPROXEN 500 MG PO TABS: 500.0000 mg | ORAL_TABLET | Freq: Two times a day (BID) | ORAL | 0 refills | Status: AC | PRN

## 2024-03-16 NOTE — Progress Notes (Signed)
 "  Patient ID: Kelsey Black, female    DOB: [birth year], 34 y.o.   MRN: [id number]  Chief Complaint  Patient presents with   Headache    Sore throat, headache, cough, present for 4 days. Has tried dayquil/nyquil and theraflu, which did not help slightly.   Discussed the use of AI scribe software for clinical note transcription with the patient, who gave verbal consent to proceed.  History of Present Illness Kelsey Black is a 34 year old female who presents with sore throat and congestion after recent exposure to COVID-19.  She has had sore throat and nasal congestion for four days, with throat pain, facial pressure, and thick phlegm that is hard to clear. She has a mild cough without chest congestion. She had close contact with a friend who tested positive for COVID-19 last Friday. She has had COVID-19 in the past and is vaccinated. She has not used antiviral medication for this illness. She denies asthma, though there is a family history of asthma. She takes Nyquil at night, which helps her sleep, but she still wakes with throat discomfort and some difficulty breathing. She has not recently used Sudafed or nasal sprays. She has not taken additional OTC pain relievers for body aches.  Assessment & Plan Acute pharyngitis Sore throat and swollen tonsils. Negative for strep throat. Persistent throat discomfort. - Continue Nyquil at night for symptomatic relief. - Prescribed nasal spray for sinus symptoms, to be used twice daily initially, then once daily as symptoms improve. - Prescribes Naproxen  to help with tonsil swelling and pain. - Call back if symptoms are persisting into next week.  Viral sinusitis Congestion, facial pain, and phlegm. Negative for COVID-19 and flu. Sinus pressure and runny nose present. - Prescribed Nasacort  nasal spray for sinus symptoms, to be used twice daily initially, then once daily as symptoms improve. - Recommended Sudafed for  decongestion. - Advised use of DayQuil during the day for additional relief. - Call back if symptoms are persisting into next week.  Myalgia Body aches likely related to viral infection. No fever. - Prescribed Naproxen  500mg  bid prn for body aches and inflammation.  Cough Mild cough with no significant sputum production. Lungs clear on exam. - Sending Nasacort  nasal spray which will help reduce throat mucus which may be contributing to cough. - Call back if symptoms are persisting into next week.  Subjective:    Outpatient Medications Prior to Visit  Medication Sig Dispense Refill   acetaminophen  (TYLENOL ) 500 MG tablet Take 1,000 mg by mouth every 6 (six) hours as needed for mild pain.     desvenlafaxine  (PRISTIQ ) 25 MG 24 hr tablet Take 1 tablet by mouth once daily 30 tablet 0   EPINEPHrine  0.3 mg/0.3 mL IJ SOAJ injection Inject 0.3 mg into the muscle as needed for anaphylaxis. 1 each 2   No facility-administered medications prior to visit.   Past Medical History:  Diagnosis Date   Allergy    Anemia    Anxiety and depression 03/12/2022   Back pain    Chorioamnionitis 2019   Clotting disorder 08/2020   Black polyps    Depression    Endometriosis    Family history of adverse reaction to anesthesia    mother woke up during abdominal surgery 3 years ago   Family history of Black cancer 04/04/2021   Nausea & vomiting    last month   Pregnancy    19 weeks and few days michelle horvath ob dr  Symptomatic cholelithiasis    Past Surgical History:  Procedure Laterality Date   CHOLECYSTECTOMY N/A 11/16/2016   Procedure: LAPAROSCOPIC CHOLECYSTECTOMY;  Surgeon: Teresa Lonni HERO, MD;  Location: [facility];  Service: General;  Laterality: N/A;   COLONOSCOPY W/ POLYPECTOMY  2023   EYE SURGERY  2022   HEMORRHOID SURGERY N/A 05/13/2021   Procedure: INTERNAL AND EXTERNAL HEMORRHOIDECTOMY;  Surgeon: Sebastian Moles, MD;  Location: [facility] OR;  Service: General;  Laterality: N/A;   TOOTH  EXTRACTION     Allergies[1]    Objective:    Physical Exam Vitals and nursing note reviewed.  Constitutional:      Appearance: Normal appearance. She is ill-appearing.     Interventions: Face mask in place.  HENT:     Right Ear: Tympanic membrane and ear canal normal.     Left Ear: Tympanic membrane and ear canal normal.     Nose:     Right Sinus: Frontal sinus tenderness present.     Left Sinus: Frontal sinus tenderness present.     Mouth/Throat:     Mouth: Mucous membranes are moist.     Pharynx: Posterior oropharyngeal erythema and postnasal drip present. No pharyngeal swelling, oropharyngeal exudate or uvula swelling.     Tonsils: No tonsillar exudate or tonsillar abscesses. 2+ on the right. 2+ on the left.  Cardiovascular:     Rate and Rhythm: Normal rate and regular rhythm.  Pulmonary:     Effort: Pulmonary effort is normal.     Breath sounds: Normal breath sounds.  Musculoskeletal:        General: Normal range of motion.  Lymphadenopathy:     Head:     Right side of head: No preauricular or posterior auricular adenopathy.     Left side of head: No preauricular or posterior auricular adenopathy.     Cervical: No cervical adenopathy.  Skin:    General: Skin is warm and dry.  Neurological:     Mental Status: She is alert.  Psychiatric:        Mood and Affect: Mood normal.        Behavior: Behavior normal.    BP 122/70 (BP Location: Left Arm, Patient Position: Sitting, Cuff Size: Large)   Pulse (!) 105   Temp 99.7 F (37.6 C) (Temporal)   Ht 5' 4.5 (1.638 m)   Wt 212 lb 2 oz (96.2 kg)   LMP 02/25/2024 (Exact Date)   SpO2 98%   BMI 35.85 kg/m  Wt Readings from Last 3 Encounters:  03/16/24 212 lb 2 oz (96.2 kg)  03/06/24 204 lb (92.5 kg)  01/31/24 201 lb (91.2 kg)      Dabney Schanz, NP     [1]  Allergies Allergen Reactions   Penicillins Other (See Comments), Swelling, Rash and Itching    Reaction:  Arm/leg swelling   Has patient had a PCN  reaction causing immediate rash, facial/tongue/throat swelling, SOB or lightheadedness with hypotension: Yes  Has patient had a PCN reaction causing severe rash involving mucus membranes or skin necrosis: No  Has patient had a PCN reaction that required hospitalization: No  Has patient had a PCN reaction occurring within the last 10 years: No  If all of the above answers are NO, then may proceed with Cephalosporin use.  Reaction:  Arm/leg swelling  Has patient had a PCN reaction causing immediate rash, facial/tongue/throat swelling, SOB or lightheadedness with hypotension: Yes Has patient had a PCN reaction causing severe rash involving mucus membranes or skin  necrosis: No Has patient had a PCN reaction that required hospitalization: No Has patient had a PCN reaction occurring within the last 10 years: No If all of the above answers are NO, then may proceed with Cephalosporin use.    Leg went numb when younger.    Leg went numb when younger. Reaction:  Arm/leg swelling  Has patient had a PCN reaction causing immediate rash, facial/tongue/throat swelling, SOB or lightheadedness with hypotension: Yes Has patient had a PCN reaction causing severe rash involving mucus membranes or skin necrosis: No Has patient had a PCN reaction that required hospitalization: No Has patient had a PCN reaction occurring within the last 10 years: No If all of the above answers are NO, then may proceed with Cephalosporin use.   Shellfish Allergy Other (See Comments), Swelling and Itching    Localized swelling   Shellfish Protein-Containing Drug Products     Other Reaction(s): edema   "

## 2024-03-21 ENCOUNTER — Telehealth: Payer: Self-pay

## 2024-03-21 NOTE — Telephone Encounter (Signed)
 Copied from CRM [id number]. Topic: Clinical - Medication Question >> Mar 21, 2024  1:18 PM Kelsey Black wrote: Reason for CRM: Patient called in and state that the over the counter medication is not working for the flem in throat and the doctor adv if she needs something sent in she would send it. Patient requesting for medication the doctor offered to be sent to [facility] pharmacy

## 2024-03-22 NOTE — Telephone Encounter (Signed)
 Please review patient MyChart response.

## 2024-03-22 NOTE — Telephone Encounter (Signed)
 so she didn't have the instructions on the bottle - she should be doing 2 squirts each nostril twice a day until symptoms are improving then back down to 1 squirt bid & then qd - she can get on my schedule Friday if still having sx - is the phlegm clear? thick? coughing also? thx

## 2024-03-24 ENCOUNTER — Ambulatory Visit: Admitting: Family

## 2024-03-27 ENCOUNTER — Ambulatory Visit: Admitting: Bariatrics

## 2024-04-13 ENCOUNTER — Ambulatory Visit: Admitting: Bariatrics
